# Patient Record
Sex: Female | Born: 1965
Health system: Southern US, Community
[De-identification: ages and names within clinical notes are randomized; demographics above are authoritative.]

## PROBLEM LIST (undated history)

## (undated) DIAGNOSIS — D649 Anemia, unspecified: Secondary | ICD-10-CM

## (undated) DIAGNOSIS — A630 Anogenital (venereal) warts: Secondary | ICD-10-CM

## (undated) DIAGNOSIS — G4730 Sleep apnea, unspecified: Secondary | ICD-10-CM

## (undated) DIAGNOSIS — N179 Acute kidney failure, unspecified: Secondary | ICD-10-CM

## (undated) DIAGNOSIS — T7840XA Allergy, unspecified, initial encounter: Secondary | ICD-10-CM

## (undated) DIAGNOSIS — Z9289 Personal history of other medical treatment: Secondary | ICD-10-CM

## (undated) DIAGNOSIS — I10 Essential (primary) hypertension: Secondary | ICD-10-CM

## (undated) DIAGNOSIS — G709 Myoneural disorder, unspecified: Secondary | ICD-10-CM

## (undated) DIAGNOSIS — Z8619 Personal history of other infectious and parasitic diseases: Secondary | ICD-10-CM

## (undated) DIAGNOSIS — C50919 Malignant neoplasm of unspecified site of unspecified female breast: Secondary | ICD-10-CM

## (undated) HISTORY — DX: Personal history of other infectious and parasitic diseases: Z86.19

## (undated) HISTORY — DX: Anemia, unspecified: D64.9

## (undated) HISTORY — DX: Sleep apnea, unspecified: G47.30

## (undated) HISTORY — DX: Malignant neoplasm of unspecified site of unspecified female breast: C50.919

## (undated) HISTORY — DX: Myoneural disorder, unspecified: G70.9

## (undated) HISTORY — PX: WISDOM TOOTH EXTRACTION: SHX21

## (undated) HISTORY — DX: Anogenital (venereal) warts: A63.0

## (undated) HISTORY — DX: Allergy, unspecified, initial encounter: T78.40XA

## (undated) HISTORY — DX: Essential (primary) hypertension: I10

## (undated) HISTORY — PX: DENTAL SURGERY: SHX609

## (undated) HISTORY — DX: Acute kidney failure, unspecified: N17.9

## (undated) HISTORY — DX: Personal history of other medical treatment: Z92.89

## (undated) HISTORY — PX: MYOMECTOMY: SHX85

## (undated) HISTORY — PX: CERVICAL CONE BIOPSY: SUR198

---

## 2002-02-06 ENCOUNTER — Encounter: Payer: Self-pay | Admitting: Obstetrics and Gynecology

## 2002-02-06 ENCOUNTER — Encounter: Admission: RE | Admit: 2002-02-06 | Discharge: 2002-02-06 | Payer: Self-pay | Admitting: Obstetrics and Gynecology

## 2003-04-24 ENCOUNTER — Inpatient Hospital Stay (HOSPITAL_COMMUNITY): Admission: AD | Admit: 2003-04-24 | Discharge: 2003-04-26 | Payer: Self-pay | Admitting: Family Medicine

## 2003-05-09 ENCOUNTER — Emergency Department (HOSPITAL_COMMUNITY): Admission: EM | Admit: 2003-05-09 | Discharge: 2003-05-09 | Payer: Self-pay | Admitting: *Deleted

## 2003-05-15 ENCOUNTER — Encounter: Payer: Self-pay | Admitting: *Deleted

## 2003-05-15 ENCOUNTER — Ambulatory Visit (HOSPITAL_COMMUNITY): Admission: RE | Admit: 2003-05-15 | Discharge: 2003-05-15 | Payer: Self-pay | Admitting: *Deleted

## 2003-06-06 ENCOUNTER — Encounter (INDEPENDENT_AMBULATORY_CARE_PROVIDER_SITE_OTHER): Payer: Self-pay | Admitting: Specialist

## 2003-06-06 ENCOUNTER — Inpatient Hospital Stay (HOSPITAL_COMMUNITY): Admission: EM | Admit: 2003-06-06 | Discharge: 2003-06-09 | Payer: Self-pay | Admitting: Emergency Medicine

## 2003-06-08 ENCOUNTER — Encounter (INDEPENDENT_AMBULATORY_CARE_PROVIDER_SITE_OTHER): Payer: Self-pay | Admitting: Cardiology

## 2003-06-17 ENCOUNTER — Encounter: Admission: RE | Admit: 2003-06-17 | Discharge: 2003-06-17 | Payer: Self-pay | Admitting: Internal Medicine

## 2003-06-17 ENCOUNTER — Ambulatory Visit (HOSPITAL_COMMUNITY): Admission: RE | Admit: 2003-06-17 | Discharge: 2003-06-17 | Payer: Self-pay | Admitting: Internal Medicine

## 2003-06-23 ENCOUNTER — Encounter: Admission: RE | Admit: 2003-06-23 | Discharge: 2003-06-23 | Payer: Self-pay | Admitting: Internal Medicine

## 2003-07-01 ENCOUNTER — Encounter: Admission: RE | Admit: 2003-07-01 | Discharge: 2003-07-01 | Payer: Self-pay | Admitting: Internal Medicine

## 2003-07-01 ENCOUNTER — Ambulatory Visit (HOSPITAL_COMMUNITY): Admission: RE | Admit: 2003-07-01 | Discharge: 2003-07-01 | Payer: Self-pay | Admitting: Internal Medicine

## 2003-07-06 ENCOUNTER — Encounter: Admission: RE | Admit: 2003-07-06 | Discharge: 2003-07-06 | Payer: Self-pay | Admitting: Internal Medicine

## 2003-07-07 ENCOUNTER — Encounter: Admission: RE | Admit: 2003-07-07 | Discharge: 2003-07-07 | Payer: Self-pay | Admitting: Internal Medicine

## 2004-04-18 ENCOUNTER — Encounter: Admission: RE | Admit: 2004-04-18 | Discharge: 2004-04-18 | Payer: Self-pay | Admitting: Nephrology

## 2008-03-16 ENCOUNTER — Encounter: Admission: RE | Admit: 2008-03-16 | Discharge: 2008-03-16 | Payer: Self-pay | Admitting: Obstetrics and Gynecology

## 2009-04-08 ENCOUNTER — Encounter: Admission: RE | Admit: 2009-04-08 | Discharge: 2009-04-08 | Payer: Self-pay | Admitting: Obstetrics and Gynecology

## 2010-05-02 ENCOUNTER — Encounter: Admission: RE | Admit: 2010-05-02 | Discharge: 2010-05-02 | Payer: Self-pay | Admitting: Obstetrics and Gynecology

## 2010-05-21 ENCOUNTER — Ambulatory Visit: Payer: Self-pay | Admitting: Family Medicine

## 2010-05-21 DIAGNOSIS — I10 Essential (primary) hypertension: Secondary | ICD-10-CM | POA: Insufficient documentation

## 2010-05-21 DIAGNOSIS — M549 Dorsalgia, unspecified: Secondary | ICD-10-CM | POA: Insufficient documentation

## 2010-06-29 ENCOUNTER — Ambulatory Visit: Payer: Self-pay | Admitting: Internal Medicine

## 2010-06-29 DIAGNOSIS — M546 Pain in thoracic spine: Secondary | ICD-10-CM | POA: Insufficient documentation

## 2010-08-22 ENCOUNTER — Encounter: Payer: Self-pay | Admitting: Obstetrics and Gynecology

## 2010-08-30 NOTE — Assessment & Plan Note (Signed)
Summary: back pain, last ov 2005/bcbs/#/ok dr plot/cd   Vital Signs:  Patient profile:   45 year old female Height:      64 inches Weight:      271 pounds BMI:     46.69 Temp:     99.0 degrees F oral Pulse rate:   66 / minute Pulse rhythm:   regular Resp:     16 per minute BP sitting:   150 / 90  (left arm) Cuff size:   large  Vitals Entered By: Lanier Prude, CMA(AAMA) (June 29, 2010 4:09 PM) CC: dull ache/burning back pain   CC:  dull ache/burning back pain.  History of Present Illness: C/o pain in thoracic back on L to the medial from L shoulder blade, dull x 1+ month  Current Medications (verified): 1)  Labetalol Hcl 300 Mg Tabs (Labetalol Hcl) .... Take One Tablet Two Times A Day 2)  Hydrochlorothiazide 25 Mg Tabs (Hydrochlorothiazide) .... Take One Tablet Daily 3)  Hydralazine Hcl 100 Mg Tabs (Hydralazine Hcl) .... Take One Tablet Daily 4)  Prenatal Vitamins 0.8 Mg Tabs (Prenatal Multivit-Min-Fe-Fa) .... Take One Tablet Daily 5)  Iron 325 (65 Fe) Mg Tabs (Ferrous Sulfate) .... Take One Tablet Daily 6)  Naprosyn 500 Mg Tabs (Naproxen) .Marland Kitchen.. 1 Two Times A Day X5 Days and Then As Needed.  Take W/ Food.  Allergies (verified): 1)  ! Penicillin 2)  ! Avelox 3)  ! Erythromycin 4)  ! 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    Physical Exam  General:  Well-developed,well-nourished,in no acute distress; alert,appropriate and cooperative throughout examination Nose:  External nasal examination shows no deformity or inflammation. Nasal mucosa are pink and moist without lesions or exudates. Mouth:  Oral mucosa and oropharynx without lesions or exudates.  Teeth in good repair. Lungs:  Normal respiratory effort, chest expands symmetrically. Lungs are clear to auscultation, no crackles or wheezes. Heart:  Normal rate and regular rhythm. S1 and S2 normal without gallop, murmur, click, rub or other extra sounds. Abdomen:  Bowel sounds positive,abdomen soft and non-tender without masses, organomegaly or hernias noted. Msk:  L paraspinal musscles between the spine and L shoulder blade are tender   Impression & Recommendations:  Problem # 1:  BACK PAIN, THORACIC REGION (ICD-724.1) MSK Assessment Unchanged See "Patient Instructions". Do not carry heavy books, bags The following medications were removed from the medication list:    Naprosyn 500 Mg Tabs (Naproxen) .Marland Kitchen... 1 two times a day x5 days and then as needed.  take w/ food. Her updated medication list for this problem includes:    Ibuprofen 600 Mg Tabs (Ibuprofen) .Marland Kitchen... 1 by mouth bid  pc x 1 wk then as needed for  pain  Orders: T-Thoracic Spine 2 Views [phone])  Complete Medication List: 1)  Labetalol Hcl 300 Mg Tabs (Labetalol hcl) .... Take one tablet two times a day 2)  Hydrochlorothiazide 25 Mg Tabs (Hydrochlorothiazide) .... Take one tablet  daily 3)  Hydralazine Hcl 100 Mg Tabs (Hydralazine hcl) .... Take one tablet daily 4)  Prenatal Vitamins 0.8 Mg Tabs (Prenatal multivit-min-fe-fa) .... Take one tablet daily 5)  Iron 325 (65 Fe) Mg Tabs (Ferrous sulfate) .... Take one tablet daily 6)  Ibuprofen 600 Mg Tabs (Ibuprofen) .Marland Kitchen.. 1 by mouth bid  pc x 1 wk then as needed for  pain  Patient Instructions: 1)  Use stretching and balance exercises that I  have provided (15 min. or longer every day) 2)  Move monitor to center 3)  Use phone head set 4)  Do not carry heavy loads 5)  Call if you are not better in a reasonable amount of time or if worse.  Prescriptions: IBUPROFEN 600 MG TABS (IBUPROFEN) 1 by mouth bid  pc x 1 wk then as needed for  pain  #60 x 3   Entered and Authorized by:   Tresa Garter MD   Signed by:   Tresa Garter MD on 06/29/2010   Method used:   Print then Give to Patient   RxID:   [id number]    Orders Added: 1)  T-Thoracic Spine 2 Views [72070TC] 2)  Est. Patient Level III [14782]

## 2010-08-30 NOTE — Assessment & Plan Note (Signed)
Summary: BACK ISSUES/PAIN/RCD   Vital Signs:  Patient profile:   45 year old female Weight:      272 pounds Pulse rate:   66 / minute BP sitting:   144 / 100  (left arm)  Vitals Entered By: Doristine Devoid CMA (May 21, 2010 12:07 PM) CC: back pain    History of Present Illness: 45 yo woman here today as a new pt.  last saw Dr Posey Rea 'yrs ago'.    1) L shoulder pain- full ROM, pain w/ turning.  sxs first started 1 week ago.  'hasn't been hurting me enough to take anything for the pain.  it feels like a pulled muscle'.  first noticed while watching TV.  L arm is dominant.  no radiation of pain into arm, no pain in neck.    Current Medications (verified): 1)  Labetalol Hcl 300 Mg Tabs (Labetalol Hcl) .... Take One Tablet Two Times A Day 2)  Hydrochlorothiazide 25 Mg Tabs (Hydrochlorothiazide) .... Take One Tablet Daily 3)  Hydralazine Hcl 100 Mg Tabs (Hydralazine Hcl) .... Take One Tablet Daily 4)  Prenatal Vitamins 0.8 Mg Tabs (Prenatal Multivit-Min-Fe-Fa) .... Take One Tablet Daily 5)  Iron 325 (65 Fe) Mg Tabs (Ferrous Sulfate) .... Take One Tablet Daily 6)  Naprosyn 500 Mg Tabs (Naproxen) .Marland Kitchen.. 1 Two Times A Day X5 Days and Then As Needed.  Take W/ Food.  Allergies (verified): 1)  ! Penicillin 2)  ! Avelox 3)  ! Erythromycin 4)  ! 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  discomfort located near L scapula but 'it moves'. Pulses:  +2 carotid, radial Extremities:  no C/C/E Neurologic:  alert & oriented X3, cranial nerves II-XII intact, strength normal in all extremities, and sensation intact to light touch.     Impression & Recommendations:  Problem # 1:  BACK PAIN (ICD-724.5) Assessment New pt's pain is vague and not reproducible on exam.  no red flags on hx or PE.  start NSAIDs.  reviewed supportive care and red flags that should prompt return.  Pt expresses understanding and is in agreement w/ this plan. Her updated medication list for this problem includes:    Naprosyn 500 Mg Tabs (Naproxen) .Marland Kitchen... 1 two times a day x5 days and then as needed.  take w/ food.  Complete Medication List: 1)  Labetalol Hcl 300 Mg Tabs (Labetalol hcl) .... Take one tablet two times a day 2)  Hydrochlorothiazide 25 Mg Tabs (Hydrochlorothiazide) .... Take one tablet daily 3)  Hydralazine Hcl 100 Mg Tabs (Hydralazine hcl) .... Take one tablet daily 4)  Prenatal Vitamins 0.8 Mg Tabs (Prenatal multivit-min-fe-fa) .... Take one tablet daily 5)  Iron 325 (65 Fe) Mg Tabs (Ferrous sulfate) .... Take one tablet daily 6)  Naprosyn 500 Mg Tabs (Naproxen) .Marland Kitchen.. 1 two times a day x5 days and then as needed.  take w/ food.  Patient Instructions:  1)  Please schedule a follow up appt w/ Dr Posey Rea- he will want to get reaquainted 2)  Take the Naprosyn regularly for the next 5 days and then as needed for pain or discomfort.  take w/ food.  avoid any additional ibuprofen or motrin type products 3)  You can add tylenol if needed 4)  Alternate heating pad w/ ice- if one feels better than the other, use that! 5)  I don't feel any cyst or obvious lump- this is good news! 6)  Call with any questions or concerns 7)  Welcome Back! Prescriptions: NAPROSYN 500 MG TABS (NAPROXEN) 1 two times a day x5 days and then  as needed.  take w/ food.  #60 x 0   Entered and Authorized by:   Neena Rhymes MD   Signed by:   Neena Rhymes MD on 05/21/2010   Method used:   [facility]ically to        [facility] [phone]* (retail)       [address]       [place], [place]  [id number]       Ph: [phone] or [phone]       Fax: [phone]   RxID:   [id number]    Orders Added: 1)  New Patient Level II [40347]

## 2010-10-17 ENCOUNTER — Telehealth: Payer: Self-pay | Admitting: Internal Medicine

## 2010-10-21 ENCOUNTER — Ambulatory Visit (INDEPENDENT_AMBULATORY_CARE_PROVIDER_SITE_OTHER): Payer: BC Managed Care – PPO | Admitting: Internal Medicine

## 2010-10-21 ENCOUNTER — Encounter: Payer: Self-pay | Admitting: Internal Medicine

## 2010-10-21 DIAGNOSIS — J069 Acute upper respiratory infection, unspecified: Secondary | ICD-10-CM

## 2010-10-21 DIAGNOSIS — H6590 Unspecified nonsuppurative otitis media, unspecified ear: Secondary | ICD-10-CM

## 2010-10-21 DIAGNOSIS — J351 Hypertrophy of tonsils: Secondary | ICD-10-CM | POA: Insufficient documentation

## 2010-10-21 MED ORDER — PSEUDOEPHEDRINE HCL ER 120 MG PO TB12: 120.0000 mg | ORAL_TABLET | Freq: Two times a day (BID) | ORAL | Status: AC | PRN

## 2010-10-21 MED ORDER — SULFAMETHOXAZOLE-TRIMETHOPRIM 400-80 MG PO TABS: 1.0000 | ORAL_TABLET | Freq: Two times a day (BID) | ORAL | Status: AC

## 2010-10-21 NOTE — Patient Instructions (Addendum)
Call if not better [facility] in the fall with labs

## 2010-10-21 NOTE — Progress Notes (Signed)
  Subjective:    Patient ID: Jasmine Stafford, female    DOB: 1965-08-11, 45 y.o.   MRN: [id number]  HPI C/o ST and voice loss x 1 wk. She wet to UC on Monday. She used herbal meds. She is wondering if her tonsils need to be taken out.   Review of Systems  Constitutional: Positive for fatigue.  HENT: Positive for congestion, sore throat, rhinorrhea, sneezing, trouble swallowing, voice change and postnasal drip. Negative for hearing loss and ear pain.   Eyes: Negative for pain.  Respiratory: Positive for cough and shortness of breath.        Objective:   Physical Exam  Constitutional: She appears well-developed. No distress.  HENT:  Head: Normocephalic.  Right Ear: Tympanic membrane is not erythematous. A middle ear effusion is present.  Left Ear: Tympanic membrane is not erythematous. A middle ear effusion is present.  Nose: Mucosal edema and rhinorrhea present.  Mouth/Throat: Mucous membranes are not pale and not cyanotic. No dental caries. Posterior oropharyngeal edema and posterior oropharyngeal erythema present. No oropharyngeal exudate or tonsillar abscesses.  Eyes: Right eye exhibits no discharge. Left eye exhibits no discharge.  Cardiovascular: Normal rate.   No murmur heard. Pulmonary/Chest: No respiratory distress. She has no wheezes.  Abdominal: Soft.  Skin: She is not diaphoretic.          Assessment & Plan:  Upper respiratory infection Use OTC meds  Seromucinous otitis media She will start antibiotic if not better  Hypertrophy of tonsil Will obtain ENT consult

## 2010-10-21 NOTE — Assessment & Plan Note (Signed)
Will obtain ENT consult

## 2010-10-21 NOTE — Assessment & Plan Note (Signed)
She will start antibiotic if not better

## 2010-10-21 NOTE — Assessment & Plan Note (Signed)
Use OTC meds

## 2010-10-27 NOTE — Progress Notes (Signed)
Summary: med inquiry  Phone Note Call from Patient Call back at Home Phone [phone]   Reason for Call: Acute Illness Summary of Call: Pt c/o sore & inflammed throat and tonsils w/extreme hoarseness. Pt would like to know if she could get OV and/or an ABX Rx.? Initial call taken by: Burnard Leigh [facility]),  October 17, 2010 12:01 PM  Follow-up for Phone Call        Use over-the-counter medicines for "cold": Tylenol  650mg  or Advil 400mg  every 6 hours  for fever; Delsym or Robutussin for cough. Mucinex or Mucinex D for congestion. Ricola or Halls for sore throat. Office visit if not better or if worse. ? UC office appt Follow-up by: Tresa Garter MD,  October 17, 2010 6:32 PM  Additional Follow-up for Phone Call Additional follow up Details #1::        informed pt she will call in a couple of day to set up appt if no relief. Additional Follow-up by: Ami Bullins CMA,  October 17, 2010 6:47 PM

## 2010-12-16 NOTE — Discharge Summary (Signed)
NAME:  Jasmine Stafford, Jasmine Stafford                        ACCOUNT NO.:  [id number]   MEDICAL RECORD NO.:  [id number]                   PATIENT TYPE:  INP   LOCATION:  9308                                 FACILITY:  [facility]   PHYSICIAN:  Tanya S. Shawnie Pons, M.D.                DATE OF BIRTH:  Mar 24, 1966   DATE OF ADMISSION:  04/24/2003  DATE OF DISCHARGE:  04/26/2003                                 DISCHARGE SUMMARY   FINAL DIAGNOSES:  1. Fibroid uterus.  2. Menorrhagia.  3. Anemia secondary to #2.  4. Elevated blood pressures.   PROCEDURES:  IV Premarin, 4 units of packed red blood cells transfused and  blood pressure maintenance.   REASON FOR ADMISSION:  Briefly, the patient is a 45 year old gravida 1, para  0 who has known to have a fibroid uterus and has been admitted previously.  Her vaginal bleeding required 4 units of pack cells and began bleeding  approximately several days ago prior to admission here.  Approximately three  days prior to admission, she developed heavy vaginal bleeding and was  actually going to see her gynecologist who is currently in [place] that  day but was so dizzy and could not stand up that EMS brought her directly  here.  Once she was evaluated here, she was found to have a hemoglobin of  5.3 and was admitted for transfusions and control of hemorrhage.  On  admission her blood pressure was 118/62 and her pulse was 87.   HOSPITAL COURSE:  The patient was admitted to the [facility] where she  received two units of packed cells and IV Premarin.  Her bleeding continued  and a posttransfusion hemoglobin was 6.5, so she was typed and crossed for  two more units of packed cells and given those over hospital day #2.  Her IV  Premarin was continued until her bleeding became scant by hospital day #3.  She did develop on hospital day #2 elevated blood pressures some even as  high as 200/110 and she also developed nausea and vomiting on hospital day  #2.  After holding  the IV Premarin, these seemed to resolve and she was  tolerating a regular diet by hospital day #3.  She was given Provera 10 mg  p.o. to stabilize her uterine lining after the IV Premarin was discontinued.  Her blood pressure remained elevated throughout hospital day #3 and she  required labetalol for her 217/118 blood pressure through the evening of  hospital day #2 and then was placed on HCTZ 25 mg on hospital day #3.  Her  pressures were approximately 170s over 100s at the time of discharge.  A  posttransfusion hemoglobin was 8.6 after the second two units of blood, and  on her final hospital day her hemoglobin had increased to 8.9.  She was  placed on iron p.o. and was doing exceptionally well on hospital day #3 and  was ready for discharge.   DISCHARGE DISPOSITION:  The patient is discharged to home in good condition.   DISCHARGE MEDICATIONS:  1. Provera 10 mg one p.o. daily for the next 7 days.  2. HCTZ 25 mg one p.o. daily with a blood pressure check in approximately 2     days.   She is instructed to return if any neurologic symptoms or persistent  headache.  Additionally, she should have her fibroids evaluated and further  control of this problem will be deferred to her gynecologist who is W.  Arma Heading M.D. in [place].   DISCHARGE ACTIVITY:  Will be ad lib.   DISCHARGE DIET:  Low salt.   Should her bleeding return, she is also instructed to return.                                               Shelbie Proctor. Shawnie Pons, M.D.    TSP/MEDQ  D:  04/26/2003  T:  04/26/2003  Job:  [id number]   cc:   Jeneen Montgomery, M.D.  [address]  [place], [place]

## 2010-12-16 NOTE — Discharge Summary (Signed)
NAME:  Jasmine Stafford, Jasmine Stafford                        ACCOUNT NO.:  [id number]   MEDICAL RECORD NO.:  [id number]                   PATIENT TYPE:  INP   LOCATION:  2928                                 FACILITY:  [facility]   PHYSICIAN:  Alvester Morin, M.D.               DATE OF BIRTH:  [birth year]   DATE OF ADMISSION:  06/06/2003  DATE OF DISCHARGE:  06/09/2003                                 DISCHARGE SUMMARY   DISCHARGE DIAGNOSES:  1. Pleural effusion.  2. Hypertension.  3. Anemia.  4. Pneumonia.   REASON FOR HOSPITALIZATION:  This patient presented to the emergency room on  June 06, 2003, with complaint of shortness of breath that started about  10 p.m. prior to the day of admission.  She was having a nonproductive cough  and chest pain that was worse with inspiration.  Chest pain did radiate to  her back with exertion and was worse when reclining and improved when she  sat up.   PAST MEDICAL HISTORY:  1. The patient was status post a myomectomy on May 21, 2003, secondary     to a history of a fibroid uterus with menorrhagia.  The patient required     four units of packed red blood cells and had a hemoglobin of 5.3 on her     admission to [facility] for the menorrhagia.  This date of admission     was April 24, 2003, through April 26, 2003.  2. Hypertension.  3. Status post D&C in June of 2002.  4. Status post a cervical cone biopsy in 1990.   DISCHARGE MEDICATIONS:  1. Hydrochlorothiazide 25 mg one p.o. daily.  2. Avelox 400 mg one tablet p.o. daily until gone for pneumonia.   PRIOR RADIOGRAPHIC STUDIES:  1. Chest PA and lateral on June 06, 2003, showed cardiomegaly with     vascular congestion and interstitial edema compatible with CHF.  2. CT of the chest on June 06, 2003, showed no evidence of pulmonary     thromboembolism, bilateral atelectatic change most severe in right lower     lobe, large right pleural effusion, cardiomegaly, and mild vascular   congestion, hiatal hernia.  3. CT of lower extremity showed no filling defects in the venous system of     lower extremities to suggest DVT.  Stranding seen in the subcutaneous fat     of the anterior lower abdomen most likely due to post-surgical changes.  4. On June 08, 2003, CT chest right decubitus view, large free-flowing     right effusion.  5. June 08, 2003, [place] and lateral chest showed no significant change from     prior study on June 06, 2003.  6. June 06, 2003, ultrasound thoracentesis was ultrasound-guided right     diagnostic and therapeutic thoracentesis that yielded 500 mL of pleural     fluid.  7. Ultrasound of the chest on June 08, 2003, showed a relatively small     amount of pleural fluid evident by ultrasound in the right pleural space,     no thoracentesis was performed.  8. 2-D echo on June 08, 2003, showed overall left ventricular systolic     function that was at the lower limits of normal, left ventricular     ejection fraction was estimated in a range of 50-55%, study was     inadequate for evaluation of the left ventricular regional wall motion     and left ventricular wall thickness is moderately increased.   PROCEDURES PERFORMED:  Therapeutic and diagnostic thoracentesis was  performed on June 06, 2003.  The patient was re-evaluated for  thoracentesis on June 08, 2003, but was found to have a relatively small  amount of pleural fluid by ultrasound and so the procedure was not  performed.   LABS:  CBC on admission; white blood cell count 10.5, hemoglobin 11.1,  hematocrit 34.1, platelet count 713, MCV 80.2, absolute neutrophil count  7.7.  CMP all values within normal limits with the exception of a serum  glucose of 106 and total protein of 8.4.  BNP was less than 32.  TSH was  within normal limits as were PT, PTT and INR.  Cardiac enzymes were  negative.  On thoracentesis fluid color was yellow appearance, cloudy, and  had a white  blood cell count of 7,667, segmented neutrophils 75, lymphocytes  16, monocytes macrophages 9.  LDH of the fluid was 475, fluid amylase 64,  fluid glucose 110, fluid pH 7.59, albumin fluid 2.6, this correlated with  patient LDH of 219 with LDH1 of 16, LDH2 of 37, LDH3 of 27, LDH4 of 11, LDH5  of 9 respectively.  Pathology smear of the fluid interpretation of June 06, 2003, showed interpretation right pleural fluid, thin prep in cell block  with reactive mesothelial cells present.  Fluid triglycerides was 440.  Acid-  fast culture was negative for acid-fast bacilli.  Report status is still  pending.  Patient was evaluated for rheumatoid factor with the result being  less than 20.  Antinuclear antibodies IGG none detected.  Culture of the  body fluid on gram smear showed white blood cells present, both polys and  mononuclear, no organisms seen, no growth x3 days, and final on that  culture.  Admission blood gas showed pH of 7.407, pCO2 of 47, bicarb 30,  pCO2 of 31, base excess 4.0.  HIV nonreactive.  Hepatitis C antibody screen  negative.  Hepatitis B surface antigen negative.  Urine pregnancy negative.   HOSPITAL COURSE:  Problem 1:  Pleural effusion/pneumonia.  Patient was  admitted to a stepdown unit.  She was placed on oxygen to keep her O2 sats  greater than 92%.  On June 06, 2003, an ultrasound-guided thoracentesis  was performed and samples were sent to the lab to be evaluated as listed  above in the laboratory section.  Subsequent to the thoracentesis, the  patient experienced some relief of her symptoms.  On June 08, 2003, a  decision was made to start her empirically on Avelox as it was thought that  her effusion may be parapneumonic in nature.  Subsequent to the start of the  medication, the patient's symptoms continued to improve.  On June 08, 2003, on her chest x-ray the patient was found again to have a large free flowing right pleural effusion.  Again, she was  sent to radiology for an  ultrasound-guided thoracentesis.  However, on ultrasound only a small amount  of pleural fluid was evident in the right pleural space and so thoracentesis  was not attempted.  Clinically the patient's symptoms improved and appeared  to be resolving, and so she was discharged on June 09, 2003, with  instructions for close follow up.  Patient was instructed to return to the  hospital if any of her shortness of breath returned or her condition  worsened.  She was scheduled for a follow up appointment in the [facility] with Dr. Donald Pore on June 16, 2003.   Problem 2:  Hypertension.  The patient was continued on hydrochlorothiazide  25 mg p.o. daily during her stay.   Problem 3:  Anemia.  Patient had a prior history of anemia as aforementioned  in her past medical history.  However, hemoglobin and hematocrit, though  mildly anemic, her hemoglobin remained stable during this admission with  values around 10.8, 10.5.     Donald Pore, MD                          Alvester Morin, M.D.   HP/MEDQ  D:  07/10/2003  T:  07/11/2003  Job:  [id number]   cc:   Danice Goltz, M.D. [facility]

## 2011-05-19 ENCOUNTER — Other Ambulatory Visit: Payer: Self-pay | Admitting: Obstetrics and Gynecology

## 2011-05-19 DIAGNOSIS — Z1231 Encounter for screening mammogram for malignant neoplasm of breast: Secondary | ICD-10-CM

## 2011-06-05 ENCOUNTER — Ambulatory Visit
Admission: RE | Admit: 2011-06-05 | Discharge: 2011-06-05 | Disposition: A | Discharge status: Home / Self Care | Payer: BC Managed Care – PPO | Source: Ambulatory Visit | Attending: Obstetrics and Gynecology | Admitting: Obstetrics and Gynecology

## 2011-06-05 DIAGNOSIS — Z1231 Encounter for screening mammogram for malignant neoplasm of breast: Secondary | ICD-10-CM

## 2012-03-28 ENCOUNTER — Telehealth: Payer: Self-pay | Admitting: Internal Medicine

## 2012-03-28 NOTE — Telephone Encounter (Signed)
Caller: [name]/Patient; Patient Name: Jasmine Stafford; PCP: Sonda Primes (Adults only); Best Callback Phone Number: [phone]; Reason for call: Became sick with nausea/vomiting and Diarrhea. Onset yesterday 03/28/12.  She is unsure if it food poisoning or virus. Afebrile. Vomited x3 today. Last emesis at 10;00 a.m.  Unsure how many times emesis occurred yesterday.  Diarrhea x 6-7 . Currently on Menstrual cycle. Mild cramping.  She states she feels like she maybe getting dehydrated.  Ginger ale and Gingerale mixed with grape juice, dry toast. She states she is unable to keep down her blood pressure medication today or yesterday.  She is now missed x3 doses of medication. She states her Blood pressure is going up and down.  Emergent s/sx ruled per Nausea or vomiting Protocol with exception to "Unable to take or keep down prescription medication ".  Call Provider Immediately.  Reviewed appointment schedule.  No available appointments for patient. Message sent to office per office protocol .   PLEASE CONTACT FOR MEDICATION FOR VOMITING OR APPOINTMENT BY PHYSICIAN.   [facility] uses [facility].

## 2012-03-29 ENCOUNTER — Emergency Department (HOSPITAL_COMMUNITY)
Admission: EM | Admit: 2012-03-29 | Discharge: 2012-03-29 | Disposition: A | Discharge status: Home / Self Care | Payer: BC Managed Care – PPO | Attending: Emergency Medicine | Admitting: Emergency Medicine

## 2012-03-29 ENCOUNTER — Encounter (HOSPITAL_COMMUNITY): Payer: Self-pay | Admitting: *Deleted

## 2012-03-29 DIAGNOSIS — E669 Obesity, unspecified: Secondary | ICD-10-CM | POA: Insufficient documentation

## 2012-03-29 DIAGNOSIS — R5383 Other fatigue: Secondary | ICD-10-CM | POA: Insufficient documentation

## 2012-03-29 DIAGNOSIS — R109 Unspecified abdominal pain: Secondary | ICD-10-CM | POA: Insufficient documentation

## 2012-03-29 DIAGNOSIS — R509 Fever, unspecified: Secondary | ICD-10-CM | POA: Insufficient documentation

## 2012-03-29 DIAGNOSIS — R197 Diarrhea, unspecified: Secondary | ICD-10-CM | POA: Insufficient documentation

## 2012-03-29 DIAGNOSIS — R112 Nausea with vomiting, unspecified: Secondary | ICD-10-CM | POA: Insufficient documentation

## 2012-03-29 DIAGNOSIS — R5381 Other malaise: Secondary | ICD-10-CM | POA: Insufficient documentation

## 2012-03-29 DIAGNOSIS — I10 Essential (primary) hypertension: Secondary | ICD-10-CM | POA: Insufficient documentation

## 2012-03-29 DIAGNOSIS — R61 Generalized hyperhidrosis: Secondary | ICD-10-CM | POA: Insufficient documentation

## 2012-03-29 DIAGNOSIS — E876 Hypokalemia: Secondary | ICD-10-CM | POA: Insufficient documentation

## 2012-03-29 LAB — CBC WITH DIFFERENTIAL/PLATELET
Basophils Absolute: 0 10*3/uL (ref 0.0–0.1)
Basophils Relative: 0 % (ref 0–1)
Eosinophils Absolute: 0 10*3/uL (ref 0.0–0.7)
Eosinophils Relative: 0 % (ref 0–5)
HCT: 34.2 % — ABNORMAL LOW (ref 36.0–46.0)
Hemoglobin: 11.3 g/dL — ABNORMAL LOW (ref 12.0–15.0)
Lymphocytes Relative: 11 % — ABNORMAL LOW (ref 12–46)
Lymphs Abs: 1.2 10*3/uL (ref 0.7–4.0)
MCH: 28.4 pg (ref 26.0–34.0)
MCHC: 33 g/dL (ref 30.0–36.0)
MCV: 85.9 fL (ref 78.0–100.0)
Monocytes Absolute: 0.4 10*3/uL (ref 0.1–1.0)
Monocytes Relative: 4 % (ref 3–12)
Neutro Abs: 9.4 10*3/uL — ABNORMAL HIGH (ref 1.7–7.7)
Neutrophils Relative %: 85 % — ABNORMAL HIGH (ref 43–77)
Platelets: 337 10*3/uL (ref 150–400)
RBC: 3.98 MIL/uL (ref 3.87–5.11)
RDW: 15.1 % (ref 11.5–15.5)
WBC: 11 10*3/uL — ABNORMAL HIGH (ref 4.0–10.5)

## 2012-03-29 LAB — URINALYSIS, ROUTINE W REFLEX MICROSCOPIC
Bilirubin Urine: NEGATIVE
Glucose, UA: NEGATIVE mg/dL
Hgb urine dipstick: NEGATIVE
Ketones, ur: NEGATIVE mg/dL
Leukocytes, UA: NEGATIVE
Nitrite: NEGATIVE
Protein, ur: NEGATIVE mg/dL
Specific Gravity, Urine: 1.018 (ref 1.005–1.030)
Urobilinogen, UA: 0.2 mg/dL (ref 0.0–1.0)
pH: 7 (ref 5.0–8.0)

## 2012-03-29 LAB — COMPREHENSIVE METABOLIC PANEL
ALT: 18 U/L (ref 0–35)
AST: 21 U/L (ref 0–37)
Albumin: 4.2 g/dL (ref 3.5–5.2)
Alkaline Phosphatase: 78 U/L (ref 39–117)
BUN: 11 mg/dL (ref 6–23)
CO2: 30 mEq/L (ref 19–32)
Calcium: 9.9 mg/dL (ref 8.4–10.5)
Chloride: 94 mEq/L — ABNORMAL LOW (ref 96–112)
Creatinine, Ser: 0.97 mg/dL (ref 0.50–1.10)
GFR calc Af Amer: 81 mL/min — ABNORMAL LOW (ref >90)
GFR calc non Af Amer: 69 mL/min — ABNORMAL LOW (ref >90)
Glucose, Bld: 159 mg/dL — ABNORMAL HIGH (ref 70–99)
Potassium: 3.1 mEq/L — ABNORMAL LOW (ref 3.5–5.1)
Sodium: 134 mEq/L — ABNORMAL LOW (ref 135–145)
Total Bilirubin: 0.5 mg/dL (ref 0.3–1.2)
Total Protein: 8.7 g/dL — ABNORMAL HIGH (ref 6.0–8.3)

## 2012-03-29 LAB — POCT PREGNANCY, URINE: Preg Test, Ur: NEGATIVE

## 2012-03-29 MED ORDER — ONDANSETRON 4 MG PO TBDP: 4.0000 mg | ORAL_TABLET | Freq: Three times a day (TID) | ORAL | Status: AC | PRN

## 2012-03-29 MED ORDER — ONDANSETRON HCL 4 MG/2ML IJ SOLN
4.0000 mg | Freq: Once | INTRAMUSCULAR | Status: AC
  Administered 2012-03-29: 4 mg via INTRAVENOUS
  Filled 2012-03-29: qty 2

## 2012-03-29 MED ORDER — METOCLOPRAMIDE HCL 5 MG/ML IJ SOLN
10.0000 mg | Freq: Once | INTRAMUSCULAR | Status: AC
  Administered 2012-03-29: 10 mg via INTRAVENOUS
  Filled 2012-03-29: qty 2

## 2012-03-29 MED ORDER — POTASSIUM CHLORIDE 10 MEQ/100ML IV SOLN
10.0000 meq | Freq: Once | INTRAVENOUS | Status: AC
  Administered 2012-03-29: 10 meq via INTRAVENOUS
  Filled 2012-03-29: qty 100

## 2012-03-29 MED ORDER — POTASSIUM CHLORIDE 20 MEQ/15ML (10%) PO LIQD
40.0000 meq | Freq: Once | ORAL | Status: AC
  Administered 2012-03-29: 40 meq via ORAL
  Filled 2012-03-29: qty 30

## 2012-03-29 MED ORDER — SODIUM CHLORIDE 0.9 % IV SOLN
Freq: Once | INTRAVENOUS | Status: AC
  Administered 2012-03-29: 05:00:00 via INTRAVENOUS

## 2012-03-29 MED ORDER — ONDANSETRON HCL 4 MG/2ML IJ SOLN
4.0000 mg | Freq: Once | INTRAMUSCULAR | Status: AC
  Administered 2012-03-29: 4 mg via INTRAVENOUS

## 2012-03-29 MED ORDER — LOPERAMIDE HCL 2 MG PO CAPS: 2.0000 mg | ORAL_CAPSULE | Freq: Four times a day (QID) | ORAL | Status: DC | PRN

## 2012-03-29 MED ORDER — LABETALOL HCL 5 MG/ML IV SOLN
10.0000 mg | Freq: Once | INTRAVENOUS | Status: AC
  Administered 2012-03-29: 10 mg via INTRAVENOUS
  Filled 2012-03-29: qty 4

## 2012-03-29 MED ORDER — LABETALOL HCL 5 MG/ML IV SOLN
10.0000 mg | Freq: Once | INTRAVENOUS | Status: AC
  Administered 2012-03-29: 10 mg via INTRAVENOUS

## 2012-03-29 MED ORDER — ONDANSETRON HCL 4 MG/2ML IJ SOLN
INTRAMUSCULAR | Status: AC
  Administered 2012-03-29: 4 mg via INTRAVENOUS
  Filled 2012-03-29: qty 2

## 2012-03-29 MED ORDER — PROMETHAZINE HCL 25 MG RE SUPP: 25.0000 mg | Freq: Four times a day (QID) | RECTAL | Status: DC | PRN

## 2012-03-29 MED ORDER — LABETALOL HCL 5 MG/ML IV SOLN
5.0000 mg | Freq: Once | INTRAVENOUS | Status: AC
  Administered 2012-03-29: 5 mg via INTRAVENOUS
  Filled 2012-03-29: qty 4

## 2012-03-29 NOTE — ED Provider Notes (Signed)
History     CSN: [id number]  Arrival date & time 03/29/12  0344   First MD Initiated Contact with Patient 03/29/12 [phone]      Chief Complaint  Patient presents with  . Diarrhea  . Emesis   HPI  History provided by the patient. Patient is a 46 year old African American female with history of hypertension, obesity who presents with complaints of nausea vomiting and diarrhea. Patient reports having acute onset of nausea vomiting Wednesday evening after returning home from work. Patient works [employer] representative. Shortly after nausea vomiting symptoms patient developed episodes of diarrhea. She reports having loose watery stools frequently throughout the day. She reports up to 7 yesterday. Denies any blood or mucus. Patient also reports multiple episodes of nausea vomiting. She has been unable to keep down much food or fluids. Patient also states she was unable to take her normal blood pressure medications. Symptoms have also been associated with some fever, chills and sweats. There is also some occasional lower abdominal cramping and pains. She denies any other aggravating or alleviating factors.    Past Medical History  Diagnosis Date  . Anemia   . HTN (hypertension)   . History of chicken pox   . Genital warts   . Status post blood transfusion without reported diagnosis 1998, 2002, 2005 x2    Past Surgical History  Procedure Date  . Myomectomy   . Cervical cone biopsy     cervical tumors    Family History  Problem Relation Age of Onset  . Breast cancer Other   . Uterine cancer Other   . Ovarian cancer Other   . Colon cancer Other   . Arthritis Other     History  Substance Use Topics  . Smoking status: Never Smoker   . Smokeless tobacco: Not on file  . Alcohol Use: No    OB History    Grav Para Term Preterm Abortions TAB SAB Ect Mult Living                  Review of Systems  Constitutional: Positive for fever, chills, diaphoresis, appetite change and fatigue.    Respiratory: Negative for cough and shortness of breath.   Cardiovascular: Negative for chest pain.  Gastrointestinal: Positive for nausea, vomiting, abdominal pain and diarrhea. Negative for constipation and blood in stool.    Allergies  Erythromycin and Moxifloxacin  Home Medications   Current Outpatient Rx  Name Route Sig Dispense Refill  . FERROUS GLUCONATE 325 MG PO TABS Oral Take 325 mg by mouth daily.      Marland Kitchen HYDRALAZINE HCL 100 MG PO TABS Oral Take 100 mg by mouth daily.      Marland Kitchen HYDROCHLOROTHIAZIDE 25 MG PO TABS Oral Take 25 mg by mouth daily.      . IBUPROFEN 600 MG PO TABS Oral Take 600 mg by mouth 2 (two) times daily as needed.      Marland Kitchen LABETALOL HCL 300 MG PO TABS Oral Take 300 mg by mouth 2 (two) times daily.      Marland Kitchen PRENATE ELITE 90-600-400 MG-MCG-MCG PO TABS Oral Take 1 tablet by mouth daily.        BP 207/91  Pulse 64  Temp 97.9 F (36.6 C)  Resp 16  SpO2 100%  Physical Exam  Nursing note and vitals reviewed. Constitutional: She is oriented to person, place, and time. She appears well-developed and well-nourished. No distress.  HENT:  Head: Normocephalic.  Cardiovascular: Normal rate and regular rhythm.  No murmur heard. Pulmonary/Chest: Effort normal and breath sounds normal. No respiratory distress. She has no wheezes. She has no rales.  Abdominal: Soft. She exhibits no distension. There is tenderness. There is no rebound and no guarding.       Mild diffuse tenderness.  Neurological: She is alert and oriented to person, place, and time.  Skin: Skin is warm and dry. No rash noted.  Psychiatric: She has a normal mood and affect. Her behavior is normal.    ED Course  Procedures   Results for orders placed during the hospital encounter of 03/29/12  CBC WITH DIFFERENTIAL      Component Value Range   WBC 11.0 (*) 4.0 - 10.5 K/uL   RBC 3.98  3.87 - 5.11 MIL/uL   Hemoglobin 11.3 (*) 12.0 - 15.0 g/dL   HCT 11.9 (*) 14.7 - 82.9 %   MCV 85.9  78.0 - 100.0 fL    MCH 28.4  26.0 - 34.0 pg   MCHC 33.0  30.0 - 36.0 g/dL   RDW 56.2  13.0 - 86.5 %   Platelets 337  150 - 400 K/uL   Neutrophils Relative 85 (*) 43 - 77 %   Neutro Abs 9.4 (*) 1.7 - 7.7 K/uL   Lymphocytes Relative 11 (*) 12 - 46 %   Lymphs Abs 1.2  0.7 - 4.0 K/uL   Monocytes Relative 4  3 - 12 %   Monocytes Absolute 0.4  0.1 - 1.0 K/uL   Eosinophils Relative 0  0 - 5 %   Eosinophils Absolute 0.0  0.0 - 0.7 K/uL   Basophils Relative 0  0 - 1 %   Basophils Absolute 0.0  0.0 - 0.1 K/uL  COMPREHENSIVE METABOLIC PANEL      Component Value Range   Sodium 134 (*) 135 - 145 mEq/L   Potassium 3.1 (*) 3.5 - 5.1 mEq/L   Chloride 94 (*) 96 - 112 mEq/L   CO2 30  19 - 32 mEq/L   Glucose, Bld 159 (*) 70 - 99 mg/dL   BUN 11  6 - 23 mg/dL   Creatinine, Ser 7.84  0.50 - 1.10 mg/dL   Calcium 9.9  8.4 - 69.6 mg/dL   Total Protein 8.7 (*) 6.0 - 8.3 g/dL   Albumin 4.2  3.5 - 5.2 g/dL   AST 21  0 - 37 U/L   ALT 18  0 - 35 U/L   Alkaline Phosphatase 78  39 - 117 U/L   Total Bilirubin 0.5  0.3 - 1.2 mg/dL   GFR calc non Af Amer 69 (*) >90 mL/min   GFR calc Af Amer 81 (*) >90 mL/min  URINALYSIS, ROUTINE W REFLEX MICROSCOPIC      Component Value Range   Color, Urine YELLOW  YELLOW   APPearance CLEAR  CLEAR   Specific Gravity, Urine 1.018  1.005 - 1.030   pH 7.0  5.0 - 8.0   Glucose, UA NEGATIVE  NEGATIVE mg/dL   Hgb urine dipstick NEGATIVE  NEGATIVE   Bilirubin Urine NEGATIVE  NEGATIVE   Ketones, ur NEGATIVE  NEGATIVE mg/dL   Protein, ur NEGATIVE  NEGATIVE mg/dL   Urobilinogen, UA 0.2  0.0 - 1.0 mg/dL   Nitrite NEGATIVE  NEGATIVE   Leukocytes, UA NEGATIVE  NEGATIVE  POCT PREGNANCY, URINE      Component Value Range   Preg Test, Ur NEGATIVE  NEGATIVE       No diagnosis found.    MDM  4:15AM patient seen and evaluated. Patient resting in bed and appears comfortable in no acute distress. Patient is hypertensive. We'll give small dose of labetalol IV. Patient normally takes 300 mg  labetalol twice a day.  Patient with slight hypokalemia. Potassium ordered. Patient complains of still feeling some nausea despite Zofran. We'll give additional antiemetics.    Pt discussed with Attending Physician.  Pt also discussed in sign out with Sarita Bottom PA-C.  They will continue to monitor pt's BP and symptoms.     Angus Seller, [place] 03/29/12 [phone]

## 2012-03-29 NOTE — Telephone Encounter (Signed)
To ER if very sick OV if ok Thx

## 2012-03-29 NOTE — ED Provider Notes (Signed)
Sign out from Per Dammen PA:  This is a 46 y.o. female with 3 days if N/V/D. found to be hypertensive patient has not been able to take her normal labetalol and hydralazine in the past 3 days to 2 vomiting.  0700 Pt seen and examined INAD, Resting comfortably, She denies vomiting, has been drinking grape juice and states that she is hungry. 185/88. Heart is regular rate and rhythm with no murmurs rubs or gallops. Lung sounds are clear to auscultation bilaterally abdominal exam is benign with no tenderness to palpation and no peritoneal signs. Cranial nerves III through XII are intact with strength 5 out of 5x4 extremities. Finger to nose and heel-to-shin coordinated,  No pronator drift. Patient not ambulated.Potassium is found to be 3.1 I will supplemented orally with 40 mEq  0800 patient is tolerating by mouth and remains pain-free. Serial abdominal exams are benign. She has not vomited and has no nausea at this time.  Patient's blood pressure is elevated however she is asymptomatic at this time. I believe the elevation is secondary to lack of taking her oral medications for the last 3 days to 2 vomiting. I will discharge the patient with a prescription for Zofran ODT Phenergan suppositories and loperamide. She will follow with her primary care doctor in the next 24-48 hours. Pt verbalized understanding and agrees with care plan. Outpatient follow-up and return precautions given.       Wynetta Emery, PA-C 03/29/12 [phone]

## 2012-03-29 NOTE — ED Notes (Signed)
Pt c/o emesis and diarrhea since Wed. Denies fevers.  Pt has been unable to take her bp medications d/t vomiting and is hypertensive.  Originally pt experienced lower abd pain, but presently does not.

## 2012-03-29 NOTE — Telephone Encounter (Signed)
Pt declined both stating she is improving slowly. She will continue to monitor her sxs and call back PRN.

## 2012-03-31 ENCOUNTER — Encounter (HOSPITAL_COMMUNITY): Payer: Self-pay | Admitting: Emergency Medicine

## 2012-03-31 ENCOUNTER — Inpatient Hospital Stay (HOSPITAL_COMMUNITY)
Admission: EM | Admit: 2012-03-31 | Discharge: 2012-04-02 | DRG: 316 | Disposition: A | Discharge status: Home / Self Care | Payer: BC Managed Care – PPO | Attending: Internal Medicine | Admitting: Internal Medicine

## 2012-03-31 ENCOUNTER — Emergency Department (HOSPITAL_COMMUNITY): Payer: BC Managed Care – PPO

## 2012-03-31 DIAGNOSIS — Z880 Allergy status to penicillin: Secondary | ICD-10-CM

## 2012-03-31 DIAGNOSIS — D649 Anemia, unspecified: Secondary | ICD-10-CM

## 2012-03-31 DIAGNOSIS — K529 Noninfective gastroenteritis and colitis, unspecified: Secondary | ICD-10-CM

## 2012-03-31 DIAGNOSIS — M546 Pain in thoracic spine: Secondary | ICD-10-CM

## 2012-03-31 DIAGNOSIS — Z881 Allergy status to other antibiotic agents status: Secondary | ICD-10-CM

## 2012-03-31 DIAGNOSIS — E860 Dehydration: Secondary | ICD-10-CM

## 2012-03-31 DIAGNOSIS — K5900 Constipation, unspecified: Secondary | ICD-10-CM

## 2012-03-31 DIAGNOSIS — R55 Syncope and collapse: Secondary | ICD-10-CM

## 2012-03-31 DIAGNOSIS — K5289 Other specified noninfective gastroenteritis and colitis: Secondary | ICD-10-CM | POA: Diagnosis present

## 2012-03-31 DIAGNOSIS — R42 Dizziness and giddiness: Secondary | ICD-10-CM | POA: Diagnosis present

## 2012-03-31 DIAGNOSIS — M549 Dorsalgia, unspecified: Secondary | ICD-10-CM

## 2012-03-31 DIAGNOSIS — E669 Obesity, unspecified: Secondary | ICD-10-CM

## 2012-03-31 DIAGNOSIS — I10 Essential (primary) hypertension: Secondary | ICD-10-CM

## 2012-03-31 DIAGNOSIS — E876 Hypokalemia: Secondary | ICD-10-CM

## 2012-03-31 DIAGNOSIS — J351 Hypertrophy of tonsils: Secondary | ICD-10-CM

## 2012-03-31 DIAGNOSIS — N179 Acute kidney failure, unspecified: Principal | ICD-10-CM

## 2012-03-31 DIAGNOSIS — H6590 Unspecified nonsuppurative otitis media, unspecified ear: Secondary | ICD-10-CM

## 2012-03-31 DIAGNOSIS — D509 Iron deficiency anemia, unspecified: Secondary | ICD-10-CM

## 2012-03-31 DIAGNOSIS — Z6841 Body Mass Index (BMI) 40.0 and over, adult: Secondary | ICD-10-CM

## 2012-03-31 DIAGNOSIS — J069 Acute upper respiratory infection, unspecified: Secondary | ICD-10-CM

## 2012-03-31 LAB — BASIC METABOLIC PANEL
CO2: 28 mEq/L (ref 19–32)
Chloride: 95 mEq/L — ABNORMAL LOW (ref 96–112)
Creatinine, Ser: 2.01 mg/dL — ABNORMAL HIGH (ref 0.50–1.10)
Glucose, Bld: 90 mg/dL (ref 70–99)

## 2012-03-31 LAB — URINALYSIS, ROUTINE W REFLEX MICROSCOPIC
Glucose, UA: NEGATIVE mg/dL
Leukocytes, UA: NEGATIVE
pH: 6.5 (ref 5.0–8.0)

## 2012-03-31 LAB — URINE MICROSCOPIC-ADD ON

## 2012-03-31 LAB — CBC
HCT: 32.4 % — ABNORMAL LOW (ref 36.0–46.0)
Hemoglobin: 10.6 g/dL — ABNORMAL LOW (ref 12.0–15.0)
MCHC: 32.7 g/dL (ref 30.0–36.0)
MCV: 85.9 fL (ref 78.0–100.0)

## 2012-03-31 LAB — SAMPLE TO BLOOD BANK

## 2012-03-31 NOTE — ED Notes (Signed)
Pt. arrived by ems. Pt reports heavy period for 7 days, nvd for 5 days. Decreased oral intake. Seen on thurs for same. Given fluid. Hx of several past blood transfusions. 80/50 supine on scene. When sitting up pt will lose consciousness and lose radial pulses. 20 R hand. fluid. Pt positioned in trendelenburg. Last pressure 114/82. 12 lead shows NS.

## 2012-03-31 NOTE — ED Notes (Signed)
Pt to xray

## 2012-03-31 NOTE — ED Notes (Addendum)
Pt currently sitting up 45 degrees in bed. Pt placed in gown and on monitor

## 2012-03-31 NOTE — ED Provider Notes (Signed)
History     CSN: [id number]  Arrival date & time 03/31/12  2116   First MD Initiated Contact with Patient 03/31/12 2119      Chief Complaint  Patient presents with  . Hypotension    (Consider location/radiation/quality/duration/timing/severity/associated sxs/prior treatment) HPI Comments: Ms. Warshawsky presents for evaluation of a near syncopal event. She was seen here in the emergency department less than 48 hours ago for evaluation of weakness. She had experienced 5 days of nausea, vomiting, and diarrhea. She is also menstruating. She reports a long history of menorrhagia, dysmenorrhea, and he secondary chronic anemia. She has had previous blood transfusions but none recently. She states at home this evening she began feeling extremely weak and dizzy. When she attempted to stand she felt as if he were about to pass out. She then slumped to the floor without losing consciousness. She was unable to stand up once on the floor and it took several minutes to crawl out of the room. Since leaving the emergency department she states the vomiting and diarrhea had greatly improved however she still has very little appetite. She is the only a few crackers and a small container of applesauce over the last 48 hours. She denies any fever, chest pain, shortness of breath, or palpitations.  The history is provided by the patient. No language interpreter was used.    Past Medical History  Diagnosis Date  . Anemia   . HTN (hypertension)   . History of chicken pox   . Genital warts   . Status post blood transfusion without reported diagnosis 1998, 2002, 2005 x2    Past Surgical History  Procedure Date  . Myomectomy   . Cervical cone biopsy     cervical tumors    Family History  Problem Relation Age of Onset  . Breast cancer Other   . Uterine cancer Other   . Ovarian cancer Other   . Colon cancer Other   . Arthritis Other     History  Substance Use Topics  . Smoking status: Never Smoker   .  Smokeless tobacco: Not on file  . Alcohol Use: No    OB History    Grav Para Term Preterm Abortions TAB SAB Ect Mult Living                  Review of Systems  Constitutional: Positive for activity change, appetite change and fatigue. Negative for fever, chills, diaphoresis and unexpected weight change.  HENT: Negative for congestion, sore throat, rhinorrhea, trouble swallowing, neck pain, neck stiffness and dental problem.   Eyes: Negative.   Respiratory: Negative for apnea, cough, chest tightness, shortness of breath and wheezing.   Cardiovascular: Negative for chest pain and leg swelling.  Gastrointestinal: Positive for nausea, vomiting and diarrhea. Negative for abdominal pain, constipation and abdominal distention.  Genitourinary: Positive for vaginal bleeding. Negative for dysuria, hematuria, flank pain, vaginal discharge, enuresis, difficulty urinating, vaginal pain and pelvic pain.  Musculoskeletal: Positive for arthralgias. Negative for back pain, joint swelling and gait problem.  Neurological: Positive for weakness and light-headedness. Negative for tremors, syncope, speech difficulty, numbness and headaches.  Hematological: Negative for adenopathy. Does not bruise/bleed easily.  Psychiatric/Behavioral: Negative.     Allergies  Erythromycin; Moxifloxacin; Penicillins; and Tequin  Home Medications   Current Outpatient Rx  Name Route Sig Dispense Refill  . FERROUS GLUCONATE 325 MG PO TABS Oral Take 325 mg by mouth daily.      Marland Kitchen HYDRALAZINE HCL 100 MG PO  TABS Oral Take 100 mg by mouth 2 (two) times daily.     Marland Kitchen HYDROCHLOROTHIAZIDE 25 MG PO TABS Oral Take 25 mg by mouth daily.      Marland Kitchen LABETALOL HCL 300 MG PO TABS Oral Take 300 mg by mouth 2 (two) times daily.      Marland Kitchen LOPERAMIDE HCL 2 MG PO CAPS Oral Take 1 capsule (2 mg total) by mouth 4 (four) times daily as needed for diarrhea or loose stools. 12 capsule 0  . ONDANSETRON 4 MG PO TBDP Oral Take 1 tablet (4 mg total) by mouth  every 8 (eight) hours as needed for nausea. 10 tablet 0  . PRENATE ELITE 90-600-400 MG-MCG-MCG PO TABS Oral Take 1 tablet by mouth daily.      Marland Kitchen PROMETHAZINE HCL 25 MG RE SUPP Rectal Place 1 suppository (25 mg total) rectally every 6 (six) hours as needed for nausea. 6 each 0    BP 92/50  Pulse 77  Temp 98 F (36.7 C) (Oral)  SpO2 100%  LMP 03/24/2012  Physical Exam  Nursing note and vitals reviewed. Constitutional: She is oriented to person, place, and time. She appears well-developed and well-nourished. No distress.  HENT:  Head: Normocephalic and atraumatic.  Right Ear: External ear normal.  Left Ear: External ear normal.  Nose: Nose normal.  Mouth/Throat: Oropharynx is clear and moist. No oropharyngeal exudate.  Eyes: Conjunctivae and EOM are normal. Pupils are equal, round, and reactive to light. Right eye exhibits no discharge. Left eye exhibits no discharge. No scleral icterus.  Neck: Normal range of motion. Neck supple. No JVD present. No tracheal deviation present. No thyromegaly present.  Cardiovascular: Normal rate, regular rhythm, normal heart sounds and intact distal pulses.  Exam reveals no gallop and no friction rub.   No murmur heard. Pulmonary/Chest: Breath sounds normal. No stridor. No respiratory distress. She has no wheezes. She has no rales. She exhibits no tenderness.  Abdominal: Soft. Bowel sounds are normal. She exhibits no distension and no mass. There is no tenderness. There is no rebound and no guarding.  Musculoskeletal: Normal range of motion. She exhibits no edema and no tenderness.  Lymphadenopathy:    She has no cervical adenopathy.  Neurological: She is alert and oriented to person, place, and time. She has normal strength. She displays no atrophy and no tremor. No cranial nerve deficit or sensory deficit. She exhibits normal muscle tone. She displays a negative Romberg sign. She displays no seizure activity. Coordination normal. GCS eye subscore is 4.  GCS verbal subscore is 5. GCS motor subscore is 6.  Skin: Skin is warm, dry and intact. No abrasion and no rash noted. She is not diaphoretic. No cyanosis. No pallor. Nails show no clubbing.  Psychiatric: She has a normal mood and affect. Her behavior is normal. Judgment normal. Her mood appears not anxious. Her affect is not angry, not blunt, not labile and not inappropriate. Her speech is not rapid and/or pressured, not delayed, not tangential and not slurred. She is not agitated, not aggressive, is not hyperactive, not slowed, not withdrawn, not actively hallucinating and not combative. Cognition and memory are normal. Cognition and memory are not impaired. She does not exhibit a depressed mood. She is communicative. She exhibits normal recent memory and normal remote memory. She is attentive.    ED Course  Procedures (including critical care time)   Labs Reviewed  CBC  SAMPLE TO BLOOD BANK  BASIC METABOLIC PANEL  URINALYSIS, ROUTINE W REFLEX MICROSCOPIC  No results found.   No diagnosis found.   Date: 04/01/2012  Rate: 65 bpm  Rhythm: sinus  QRS Axis: normal  Intervals: borderline long QT int  ST/T Wave abnormalities: nonspecific ST changes  Conduction Disutrbances:none  Narrative Interpretation: + motion artifact, no evidence of acute ischemia  Old EKG Reviewed:        MDM  Pt presents for evaluation status post a near syncopal episode. She was evaluated within the last 48 hours here in the emergency department and treated for an elevated blood pressure. Her blood pressure however currently is not elevated. In fact it has been noted to be low. She at this time appears nontoxic-no acute distress. Given her history of significant anemia secondary to menorrhagia, hypertension, and renal insufficiency, will obtain basic labs, orthostatic vital signs, a chest x-ray, and EKG. She has received multiple transfusions in the past and am concerned that the symptoms may be secondary to  hypovolemia. She is currently menstruating.  0139.  Hypotension has completely resolved. Note hematuria without evidence of urinary tract infection. Red blood cells and urologically secondary to contamination the patient is currently menstruating. Note mild anemia with hemoglobin of 10.6 and hematocrit of 32.4. This is not significantly different from recently available labs. She does have a mild leukocytosis. Clinically there is no evidence of sepsis. Chest x-ray is clear without evidence of pneumonia. Patient has no historical or physical exam findings concerning for thromboembolic event either.  Concerned secondary to worsening hypokalemia and new onset renal failure.  IVF initiated.  Started PO K+ repletion but will transition to IV.  Plan Obs admission.   Tobin Chad, MD 04/01/12 [phone]

## 2012-04-01 DIAGNOSIS — K5900 Constipation, unspecified: Secondary | ICD-10-CM

## 2012-04-01 DIAGNOSIS — E876 Hypokalemia: Secondary | ICD-10-CM

## 2012-04-01 DIAGNOSIS — D649 Anemia, unspecified: Secondary | ICD-10-CM

## 2012-04-01 DIAGNOSIS — K529 Noninfective gastroenteritis and colitis, unspecified: Secondary | ICD-10-CM

## 2012-04-01 DIAGNOSIS — D509 Iron deficiency anemia, unspecified: Secondary | ICD-10-CM

## 2012-04-01 DIAGNOSIS — E860 Dehydration: Secondary | ICD-10-CM

## 2012-04-01 DIAGNOSIS — N179 Acute kidney failure, unspecified: Principal | ICD-10-CM

## 2012-04-01 DIAGNOSIS — R55 Syncope and collapse: Secondary | ICD-10-CM

## 2012-04-01 DIAGNOSIS — K5289 Other specified noninfective gastroenteritis and colitis: Secondary | ICD-10-CM

## 2012-04-01 DIAGNOSIS — E669 Obesity, unspecified: Secondary | ICD-10-CM

## 2012-04-01 DIAGNOSIS — M549 Dorsalgia, unspecified: Secondary | ICD-10-CM

## 2012-04-01 LAB — BASIC METABOLIC PANEL
BUN: 21 mg/dL (ref 6–23)
CO2: 30 mEq/L (ref 19–32)
Calcium: 9.1 mg/dL (ref 8.4–10.5)
GFR calc non Af Amer: 41 mL/min — ABNORMAL LOW (ref >90)
Glucose, Bld: 111 mg/dL — ABNORMAL HIGH (ref 70–99)

## 2012-04-01 LAB — CBC
HCT: 30.7 % — ABNORMAL LOW (ref 36.0–46.0)
MCH: 28.1 pg (ref 26.0–34.0)
MCV: 86.2 fL (ref 78.0–100.0)
Platelets: 342 10*3/uL (ref 150–400)
RDW: 14.7 % (ref 11.5–15.5)
WBC: 11.8 10*3/uL — ABNORMAL HIGH (ref 4.0–10.5)

## 2012-04-01 MED ORDER — LABETALOL HCL 300 MG PO TABS
300.0000 mg | ORAL_TABLET | Freq: Two times a day (BID) | ORAL | Status: DC
  Administered 2012-04-01 – 2012-04-02 (×3): 300 mg via ORAL
  Filled 2012-04-01 (×5): qty 1

## 2012-04-01 MED ORDER — WHITE PETROLATUM GEL
Status: AC
  Administered 2012-04-01: 11:00:00
  Filled 2012-04-01: qty 5

## 2012-04-01 MED ORDER — PROMETHAZINE HCL 25 MG RE SUPP
25.0000 mg | Freq: Four times a day (QID) | RECTAL | Status: DC | PRN
Start: 2012-04-01 — End: 2012-04-02

## 2012-04-01 MED ORDER — FLEET ENEMA 7-19 GM/118ML RE ENEM
1.0000 | ENEMA | Freq: Once | RECTAL | Status: DC
  Filled 2012-04-01: qty 1

## 2012-04-01 MED ORDER — HYDRALAZINE HCL 50 MG PO TABS
100.0000 mg | ORAL_TABLET | Freq: Two times a day (BID) | ORAL | Status: DC
  Administered 2012-04-01 – 2012-04-02 (×3): 100 mg via ORAL
  Filled 2012-04-01 (×5): qty 2

## 2012-04-01 MED ORDER — WHITE PETROLATUM GEL
Status: AC
  Administered 2012-04-01: 13:00:00
  Filled 2012-04-01: qty 5

## 2012-04-01 MED ORDER — SODIUM CHLORIDE 0.9 % IV BOLUS (SEPSIS)
1000.0000 mL | Freq: Once | INTRAVENOUS | Status: AC
  Administered 2012-04-01: 1000 mL via INTRAVENOUS

## 2012-04-01 MED ORDER — CLONIDINE HCL 0.1 MG PO TABS
0.1000 mg | ORAL_TABLET | Freq: Four times a day (QID) | ORAL | Status: DC | PRN
  Administered 2012-04-01: 0.1 mg via ORAL
  Filled 2012-04-01 (×2): qty 1

## 2012-04-01 MED ORDER — LACTULOSE ENEMA
300.0000 mL | Freq: Once | ORAL | Status: AC
  Administered 2012-04-01: 300 mL via RECTAL
  Filled 2012-04-01: qty 300

## 2012-04-01 MED ORDER — POLYETHYLENE GLYCOL 3350 17 G PO PACK
68.0000 g | PACK | Freq: Once | ORAL | Status: AC
  Administered 2012-04-01: 68 g via ORAL
  Filled 2012-04-01: qty 4

## 2012-04-01 MED ORDER — BISACODYL 5 MG PO TBEC
10.0000 mg | DELAYED_RELEASE_TABLET | Freq: Once | ORAL | Status: AC
  Administered 2012-04-01: 10 mg via ORAL
  Filled 2012-04-01: qty 2

## 2012-04-01 MED ORDER — ACETAMINOPHEN 650 MG RE SUPP: 650.0000 mg | Freq: Four times a day (QID) | RECTAL | Status: DC | PRN

## 2012-04-01 MED ORDER — BOOST / RESOURCE BREEZE PO LIQD
1.0000 | Freq: Two times a day (BID) | ORAL | Status: DC
  Administered 2012-04-01 – 2012-04-02 (×4): 1 via ORAL

## 2012-04-01 MED ORDER — ACETAMINOPHEN 325 MG PO TABS: 650.0000 mg | ORAL_TABLET | Freq: Four times a day (QID) | ORAL | Status: DC | PRN

## 2012-04-01 MED ORDER — LOPERAMIDE HCL 2 MG PO CAPS: 2.0000 mg | ORAL_CAPSULE | Freq: Four times a day (QID) | ORAL | Status: DC | PRN

## 2012-04-01 MED ORDER — POTASSIUM CHLORIDE CRYS ER 20 MEQ PO TBCR
40.0000 meq | EXTENDED_RELEASE_TABLET | Freq: Once | ORAL | Status: AC
  Administered 2012-04-01: 40 meq via ORAL
  Filled 2012-04-01: qty 2

## 2012-04-01 MED ORDER — ONDANSETRON 4 MG PO TBDP
4.0000 mg | ORAL_TABLET | Freq: Three times a day (TID) | ORAL | Status: DC | PRN
  Administered 2012-04-01: 4 mg via ORAL
  Filled 2012-04-01: qty 1

## 2012-04-01 MED ORDER — ENOXAPARIN SODIUM 40 MG/0.4ML ~~LOC~~ SOLN
40.0000 mg | SUBCUTANEOUS | Status: DC
  Administered 2012-04-01: 40 mg via SUBCUTANEOUS
  Filled 2012-04-01 (×2): qty 0.4

## 2012-04-01 MED ORDER — POTASSIUM CHLORIDE IN NACL 20-0.9 MEQ/L-% IV SOLN
INTRAVENOUS | Status: DC
  Administered 2012-04-01 (×2): via INTRAVENOUS
  Administered 2012-04-02: 100 mL via INTRAVENOUS
  Filled 2012-04-01 (×7): qty 1000

## 2012-04-01 NOTE — Progress Notes (Signed)
Utilization review completed.  

## 2012-04-01 NOTE — H&P (Signed)
PCP:   Sonda Primes, MD   Chief Complaint:  Presyncope, weakness  HPI: This is a 46 year old female who has been having nausea vomiting and diarrhea for approximately 6 days. She was here on Friday where she was treated with IV fluids and antiemetics and discharged home. Since then her diarrhea has resolved her nausea and vomiting is significantly improved, however, she continues to feel weak. Today while in the bathroom she fell because of weakness. She denies any fevers or chills and, any cough or shortness of breath. She came to the ER. [name].   Review of Systems:  The patient denies anorexia, fever, weight loss,, vision loss, decreased hearing, hoarseness, chest pain, syncope, dyspnea on exertion, peripheral edema, balance deficits, hemoptysis, abdominal pain, melena, hematochezia, severe indigestion/heartburn, hematuria, incontinence, genital sores, muscle weakness, suspicious skin lesions, transient blindness, difficulty walking, depression, unusual weight change, abnormal bleeding, enlarged lymph nodes, angioedema, and breast masses.  Past Medical History: Past Medical History  Diagnosis Date  . Anemia   . HTN (hypertension)   . History of chicken pox   . Genital warts   . Status post blood transfusion without reported diagnosis 1998, 2002, 2005 x2   Past Surgical History  Procedure Date  . Myomectomy   . Cervical cone biopsy     cervical tumors    Medications: Prior to Admission medications   Medication Sig Start Date End Date Taking? Authorizing Provider  ferrous gluconate (FERGON) 325 MG tablet Take 325 mg by mouth daily.     Yes Historical Provider, MD  hydrALAZINE (APRESOLINE) 100 MG tablet Take 100 mg by mouth 2 (two) times daily.    Yes Historical Provider, MD  hydrochlorothiazide 25 MG tablet Take 25 mg by mouth daily.     Yes Historical Provider, MD  labetalol (NORMODYNE) 300 MG tablet Take 300 mg by mouth 2 (two) times daily.     Yes  Historical Provider, MD  loperamide (IMODIUM) 2 MG capsule Take 1 capsule (2 mg total) by mouth 4 (four) times daily as needed for diarrhea or loose stools. 03/29/12 04/08/12 Yes Nicole Pisciotta, PA-C  ondansetron (ZOFRAN ODT) 4 MG disintegrating tablet Take 1 tablet (4 mg total) by mouth every 8 (eight) hours as needed for nausea. 03/29/12 04/05/12 Yes Nicole Pisciotta, PA-C  Prenat w/o A-FE-DSS-Methfol-FA (PRENATAL MULTIVITAMIN) 90-600-400 MG-MCG-MCG tablet Take 1 tablet by mouth daily.     Yes Historical Provider, MD  promethazine (PHENERGAN) 25 MG suppository Place 1 suppository (25 mg total) rectally every 6 (six) hours as needed for nausea. 03/29/12 04/05/12 Yes Nicole Pisciotta, PA-C    Allergies:   Allergies  Allergen Reactions  . Erythromycin Hives  . Moxifloxacin Hives  . Penicillins Hives  . Tequin (Gatifloxacin) Hives    Social History:  reports that she has never smoked. She does not have any smokeless tobacco history on file. She reports that she does not drink alcohol or use illicit drugs.  Family History: Family History  Problem Relation Age of Onset  . Breast cancer Other   . Uterine cancer Other   . Ovarian cancer Other   . Colon cancer Other   . Arthritis Other     Physical Exam: Filed Vitals:   03/31/12 2341 03/31/12 2353 04/01/12 0050 04/01/12 0151  BP: 118/61 161/75 163/89 146/84  Pulse: 92  82 96  Temp:      TempSrc:      Resp: 18 12 18    SpO2: 100%  100%  General:  Alert and oriented times three, well developed and nourished, no acute distress Eyes: PERRLA, pink conjunctiva, no scleral icterus ENT: Dry oral mucosa, neck supple, no thyromegaly Lungs: clear to ascultation, no wheeze, no crackles, no use of accessory muscles Cardiovascular: regular rate and rhythm, no regurgitation, no gallops, no murmurs. No carotid bruits, no JVD Abdomen: soft, positive BS, non-tender, non-distended, no organomegaly, not an acute abdomen GU: not examined Neuro: CN II  - XII grossly intact, sensation intact Musculoskeletal: strength 5/5 all extremities, no clubbing, cyanosis or edema Skin: no rash, no subcutaneous crepitation, no decubitus Psych: appropriate patient   Labs on Admission:   [facility] 03/31/12 2226 03/29/12 0403  NA 135 134*  K 2.9* 3.1*  CL 95* 94*  CO2 28 30  GLUCOSE 90 159*  BUN 20 11  CREATININE 2.01* 0.97  CALCIUM 9.2 9.9  MG -- --  PHOS -- --    Basename 03/29/12 0403  AST 21  ALT 18  ALKPHOS 78  BILITOT 0.5  PROT 8.7*  ALBUMIN 4.2   No results found for this basename: LIPASE:2,AMYLASE:2 in the last 72 hours  Basename 03/31/12 2226 03/29/12 0403  WBC 13.8* 11.0*  NEUTROABS -- 9.4*  HGB 10.6* 11.3*  HCT 32.4* 34.2*  MCV 85.9 85.9  PLT 329 337   No results found for this basename: CKTOTAL:3,CKMB:3,CKMBINDEX:3,TROPONINI:3 in the last 72 hours No components found with this basename: POCBNP:3 No results found for this basename: DDIMER:2 in the last 72 hours No results found for this basename: HGBA1C:2 in the last 72 hours No results found for this basename: CHOL:2,HDL:2,LDLCALC:2,TRIG:2,CHOLHDL:2,LDLDIRECT:2 in the last 72 hours No results found for this basename: TSH,T4TOTAL,FREET3,T3FREE,THYROIDAB in the last 72 hours No results found for this basename: VITAMINB12:2,FOLATE:2,FERRITIN:2,TIBC:2,IRON:2,RETICCTPCT:2 in the last 72 hours  Micro Results: No results found for this or any previous visit (from the past 240 hour(s)).  Results for Jasmine, Stafford (MRN [id number]) as of 04/01/2012 02:35  Ref. Range 03/31/2012 23:27  Color, Urine Latest Range: YELLOW  YELLOW  APPearance Latest Range: CLEAR  CLOUDY (A)  Specific Gravity, Urine Latest Range: 1.005-1.030  1.008  pH Latest Range: 5.0-8.0  6.5  Glucose Latest Range: NEGATIVE mg/dL NEGATIVE  Bilirubin Urine Latest Range: NEGATIVE  NEGATIVE  Ketones, ur Latest Range: NEGATIVE mg/dL NEGATIVE  Protein Latest Range: NEGATIVE mg/dL NEGATIVE  Urobilinogen, UA Latest  Range: 0.0-1.0 mg/dL 0.2  Nitrite Latest Range: NEGATIVE  NEGATIVE  Leukocytes, UA Latest Range: NEGATIVE  NEGATIVE  Hgb urine dipstick Latest Range: NEGATIVE  LARGE (A)  WBC, UA Latest Range: <3 WBC/hpf 3-6  RBC / HPF Latest Range: <3 RBC/hpf TOO NUMEROUS TO COUNT  Squamous Epithelial / LPF Latest Range: RARE  RARE  Bacteria, UA Latest Range: RARE  RARE    Radiological Exams on Admission: Dg Chest 2 View  03/31/2012  *RADIOLOGY REPORT*  Clinical Data: Near-syncope  CHEST - 2 VIEW  Comparison: None  Findings: Enlargement of cardiac silhouette. Tortuous aorta. Pulmonary vascularity normal. Lungs clear. No pleural effusion or pneumothorax. Bones unremarkable.  IMPRESSION: Enlargement of cardiac silhouette. No acute abnormalities.   Original Report Authenticated By: Lollie Marrow, M.D.     Assessment/Plan Present on Admission:  .Acute kidney injury Gastroenteritis - resolving  Dehydration Presyncope Hypokalemia Admit to telemetry  Patient's gastroenteritis resolving, likely viral in the region Will replete potassium IV, continue IV hydration CBC and BMP in a.m.  No clear source of infection as stated likely viral. No antibiotics started Check a magnesium level  .  HYPERTENSION Morbid obesity Stable resume home medications except for hydrochlorothiazide   Full code DVT prophylaxis   Jasmine Stafford 04/01/2012, 2:33 AM

## 2012-04-01 NOTE — Progress Notes (Signed)
Received patient from ED,admitted due to nausea,vomiting and diarrhea and fall at home.Patient alert and oriented x3.Placed on telemetry.Patient advised to call for any assist or before getting up to go to bathroom.Patient refused to have bed alarm turned on.Call bell within reach.Will continue to monitor. Apostolos Blagg Joselita,RN

## 2012-04-01 NOTE — Progress Notes (Signed)
INITIAL ADULT NUTRITION ASSESSMENT Date: 04/01/2012   Time: 10:21 AM  Reason for Assessment: Malnutrition Screening   INTERVENTION: 1. Resource Breeze po BID, each supplement provides 250 kcal and 9 grams of protein. 2. Monitor magnesium, potassium, and phosphorus daily for at least 3 days, MD to replete as needed, as pt is at risk for refeeding syndrome given dx of acute severe malnutrition. 3. Encouraged bland foods to promote better tolerance 4. RD to continue to follow nutrition care plan  DOCUMENTATION CODES Per approved criteria  -Severe malnutrition in the context of acute illness or injury -Morbid Obesity    ASSESSMENT: Female 46 y.o.  Dx: weakness and n/v  Hx:  Past Medical History  Diagnosis Date  . Anemia   . HTN (hypertension)   . History of chicken pox   . Genital warts   . Status post blood transfusion without reported diagnosis 1998, 2002, 2005 x2   Past Surgical History  Procedure Date  . Myomectomy   . Cervical cone biopsy     cervical tumors   Related Meds:     . enoxaparin (LOVENOX) injection  40 mg Subcutaneous Q24H  . hydrALAZINE  100 mg Oral BID  . labetalol  300 mg Oral BID  . potassium chloride  40 mEq Oral Once  . sodium chloride  1,000 mL Intravenous Once   Ht: 5\' 4"  (162.6 cm)  Wt: 261 lb 9.6 oz (118.661 kg)  Ideal Wt: 54.5 kg % Ideal Wt: 218%  Wt Readings from Last 15 Encounters:  04/01/12 261 lb 9.6 oz (118.661 kg)  10/21/10 269 lb (122.018 kg)  06/29/10 271 lb (122.925 kg)  05/21/10 272 lb (123.378 kg)  Usual Wt: 270 lb per pt % Usual Wt: 97%  Body mass index is 44.90 kg/(m^2). Pt is obese, class III.  Food/Nutrition Related Hx: poor intake x 1 week  Labs:  CMP     Component Value Date/Time   NA 135 03/31/2012 2226   K 2.9* 03/31/2012 2226   CL 95* 03/31/2012 2226   CO2 28 03/31/2012 2226   GLUCOSE 90 03/31/2012 2226   BUN 20 03/31/2012 2226   CREATININE 2.01* 03/31/2012 2226   CALCIUM 9.2 03/31/2012 2226   PROT 8.7* 03/29/2012  0403   ALBUMIN 4.2 03/29/2012 0403   AST 21 03/29/2012 0403   ALT 18 03/29/2012 0403   ALKPHOS 78 03/29/2012 0403   BILITOT 0.5 03/29/2012 0403   GFRNONAA 29* 03/31/2012 2226   GFRAA 33* 03/31/2012 2226   Sodium  Date/Time Value Range Status  03/31/2012 10:26 PM 135  135 - 145 mEq/L Final  03/29/2012  4:03 AM 134* 135 - 145 mEq/L Final    Potassium  Date/Time Value Range Status  03/31/2012 10:26 PM 2.9* 3.5 - 5.1 mEq/L Final  03/29/2012  4:03 AM 3.1* 3.5 - 5.1 mEq/L Final    No results found for this basename: phos    Magnesium  Date/Time Value Range Status  03/31/2012 10:26 PM 2.8* 1.5 - 2.5 mg/dL Final      Intake/Output Summary (Last 24 hours) at 04/01/12 1023 Last data filed at 04/01/12 0900  Gross per 24 hour  Intake   2025 ml  Output      0 ml  Net   2025 ml   Diet Order: Cardiac  Supplements/Tube Feeding: none  IVF:    0.9 % NaCl with KCl 20 mEq / L Last Rate: 100 mL/hr at 04/01/12 0517    Estimated Nutritional Needs:  Kcal: 1800 - 2000 kcal Protein:  70 - 80 grams Fluid: 1.8 - 2 liters daily  Pt with n/v/d x 6 days. Per patient, n/v has improved, however she feels weak. Pt is s/p fall 2/2 weakness.  Pt states that she has had a weight loss of 9 lb x 1 week. This is a weight change of 3% and is significant. Dietary recall reveals bites of applesauce, fruit, jello, and chicken noodle soup x 5 days. Yesterday and today pt has been able to tolerate more. Consumed the [place] from a [place] sandwich and a fruit cup yesterday. For breakfast today, she consumed some of her eggs and a small bagel. Pt meets criteria for severe malnutrition in the context of acute illness as evidenced by 3% wt loss x 1 week, intake of <50% of estimated energy needs x at least 5 days, and likely muscle mass and/or fat loss given ongoing weakness and fall.  Agreeable to [place] daily. Discussed bland foods.  Noted potassium and magnesium are currently low. Recommend monitoring of refeeding labs  (potassium, magnesium, and phosphorus) given dx of malnutrition and recent suboptimal PO intake.  NUTRITION DIAGNOSIS: -Inadequate oral intake (NI-2.1).  Status: Ongoing  RELATED TO: GI distress  AS EVIDENCE BY: pt report of inadequate intake and weight loss x 1 week  MONITORING/EVALUATION(Goals): Goal: Pt to meet >/= 90% of their estimated nutrition needs Monitor: weight trends, lab trends, I/O's, PO intake, supplement tolerance  EDUCATION NEEDS: -Education needs addressed  Jarold Motto MS, RD, LDN Pager: [phone] After-hours pager: [phone]

## 2012-04-01 NOTE — Progress Notes (Addendum)
[facility] PROGRESS NOTE  Jasmine Stafford ZOX:[id number] DOB: Dec 22, 1965 DOA: 03/31/2012 PCP: Jasmine Primes, MD  Assessment/Plan: Active Problems:  HYPERTENSION  Acute kidney injury  Hypokalemia  Dehydration  Gastroenteritis  Obesity (BMI 35.0-39.9 without comorbidity)  Acute kidney injury:  Likely due to dehydration after severe vomiting and diarrhea.  Patient has received intravenous fluids overnight.  No thrombocytopenia or blood in stools to suggest HUS. -  BMP with magnesium this AM  Hypokalemia:  Likely due to GI losses.   Consider adrenal insufficiency if orthostasis and electrolyte imbalance persist despite IVF. -  Check potassium this AM  Orthostasis:  Patient no longer feels lightheaded -  Recheck orthostatics  Constipation:   -  Lactulose enema (given AKI) -  Miralax and bisacodyl  Hypertension, uncontrolled:   -  Continue labetalol and hydralazine -  Will restart home BP meds after rehydrated  Anemia, normocytic.  Chronic due to menses per patient and managed by PCP. -  Defer to outpatient management and evaluation.    DIET:  Healthy heart ACCESS:  PIV Proph:  Lovenox  Code Status: Full  Family Communication: Spoke with patient and her fiance Disposition Plan:  Pending improvement in orthostatics, constipation, hypokalemia, and kidney function.     Brief narrative:  This is a 46 year old female who has been having nausea vomiting and diarrhea for approximately 6 days. She was here on Friday where she was treated with IV fluids and antiemetics and discharged home. Since then her diarrhea has resolved her nausea and vomiting is significantly improved, however, she continues to feel weak. Today while in the bathroom she fell because of weakness. She denies any fevers or chills and, any cough or shortness of breath. She came to the ER. [name].     Consultants:  None  Procedures:  None  Antibiotics:  None  HPI/Subjective:  Patient states that she feels improved this morning.  Denies lightheadedness, nausea, recent vomiting.  She feels constipated.  Last BM was 3 days ago.    Objective: Filed Vitals:   04/01/12 0050 04/01/12 0151 04/01/12 0415 04/01/12 1000  BP: 163/89 146/84 156/87 152/81  Pulse: 82 96 78 86  Temp:   99 F (37.2 C) 99.1 F (37.3 C)  TempSrc:   Oral Oral  Resp: 18  18 20   Height:   5\' 4"  (1.626 m)   Weight:   118.661 kg (261 lb 9.6 oz)   SpO2: 100%  96% 98%    Intake/Output Summary (Last 24 hours) at 04/01/12 1042 Last data filed at 04/01/12 0900  Gross per 24 hour  Intake   2025 ml  Output      0 ml  Net   2025 ml   Filed Weights   04/01/12 0415  Weight: 118.661 kg (261 lb 9.6 oz)    Exam:   General:  Obese AAF, no acute distress, lying in bed  HEENT:  PERRL, anicteric, MMM  Neck:  Supple, no lymphadenopathy  Cardiovascular: RRR, no murmurs, rubs, or gallops  Respiratory: CTAB  Back: No CVA tenderness  Abdomen: NABS, soft, feels pressure with deep palpation.  No rebound or gurading  MSK:  Trace pitting LEE, 2+ pulses  SKin:  Moist  Neuro:  Grossly intact  Psych:  A&Ox4  Data Reviewed: Basic Metabolic Panel:  Lab 03/31/12 8119 03/29/12 0403  NA 135 134*  K 2.9* 3.1*  CL 95* 94*  CO2 28 30  GLUCOSE 90 159*  BUN 20 11  CREATININE 2.01* 0.97  CALCIUM 9.2 9.9  MG 2.8* --  PHOS -- --   Liver Function Tests:  Lab 03/29/12 0403  AST 21  ALT 18  ALKPHOS 78  BILITOT 0.5  PROT 8.7*  ALBUMIN 4.2   No results found for this basename: LIPASE:5,AMYLASE:5 in the last 168 hours No results found for this basename: AMMONIA:5 in the last 168 hours CBC:  Lab 03/31/12 2226 03/29/12 0403  WBC 13.8* 11.0*  NEUTROABS -- 9.4*  HGB 10.6* 11.3*  HCT 32.4* 34.2*  MCV 85.9 85.9  PLT 329 337   Cardiac Enzymes: No results found for this basename:  CKTOTAL:5,CKMB:5,CKMBINDEX:5,TROPONINI:5 in the last 168 hours BNP (last 3 results) No results found for this basename: PROBNP:3 in the last 8760 hours CBG: No results found for this basename: GLUCAP:5 in the last 168 hours  No results found for this or any previous visit (from the past 240 hour(s)).   Studies: Dg Chest 2 View  03/31/2012  *RADIOLOGY REPORT*  Clinical Data: Near-syncope  CHEST - 2 VIEW  Comparison: None  Findings: Enlargement of cardiac silhouette. Tortuous aorta. Pulmonary vascularity normal. Lungs clear. No pleural effusion or pneumothorax. Bones unremarkable.  IMPRESSION: Enlargement of cardiac silhouette. No acute abnormalities.   Original Report Authenticated By: Lollie Marrow, M.D.     Scheduled Meds:   . bisacodyl  10 mg Oral Once  . enoxaparin (LOVENOX) injection  40 mg Subcutaneous Q24H  . feeding supplement  1 Container Oral BID BM  . hydrALAZINE  100 mg Oral BID  . labetalol  300 mg Oral BID  . polyethylene glycol  68 g Oral Once  . potassium chloride  40 mEq Oral Once  . sodium chloride  1,000 mL Intravenous Once  . sodium phosphate  1 enema Rectal Once   Continuous Infusions:   . 0.9 % NaCl with KCl 20 mEq / L 100 mL/hr at 04/01/12 1610    Active Problems:  HYPERTENSION  Acute kidney injury  Hypokalemia  Dehydration  Gastroenteritis  Obesity (BMI 35.0-39.9 without comorbidity)    Time spent: 30    Jasmine Stafford, [facility]  [facility] Pager [phone]. If 8PM-8AM, please contact night-coverage at [email or web address], password [facility] 04/01/2012, 10:42 AM  LOS: 1 day

## 2012-04-02 DIAGNOSIS — K5900 Constipation, unspecified: Secondary | ICD-10-CM

## 2012-04-02 LAB — CBC
HCT: 27.5 % — ABNORMAL LOW (ref 36.0–46.0)
Hemoglobin: 8.8 g/dL — ABNORMAL LOW (ref 12.0–15.0)
MCH: 28.2 pg (ref 26.0–34.0)
MCHC: 32 g/dL (ref 30.0–36.0)
RDW: 15.1 % (ref 11.5–15.5)

## 2012-04-02 LAB — BASIC METABOLIC PANEL
BUN: 24 mg/dL — ABNORMAL HIGH (ref 6–23)
Calcium: 8.7 mg/dL (ref 8.4–10.5)
GFR calc Af Amer: 49 mL/min — ABNORMAL LOW (ref >90)
GFR calc non Af Amer: 42 mL/min — ABNORMAL LOW (ref >90)
Glucose, Bld: 75 mg/dL (ref 70–99)
Potassium: 3.2 mEq/L — ABNORMAL LOW (ref 3.5–5.1)

## 2012-04-02 MED ORDER — POTASSIUM CHLORIDE CRYS ER 20 MEQ PO TBCR
20.0000 meq | EXTENDED_RELEASE_TABLET | Freq: Once | ORAL | Status: AC
  Administered 2012-04-02: 20 meq via ORAL
  Filled 2012-04-02: qty 1

## 2012-04-02 MED ORDER — POTASSIUM CHLORIDE CRYS ER 20 MEQ PO TBCR
40.0000 meq | EXTENDED_RELEASE_TABLET | Freq: Once | ORAL | Status: AC
  Administered 2012-04-02: 40 meq via ORAL
  Filled 2012-04-02: qty 2

## 2012-04-02 NOTE — Discharge Summary (Signed)
Physician Discharge Summary  Jasmine Stafford ZOX:[id number] DOB: 1965/09/13 DOA: 03/31/2012  PCP: Sonda Primes, MD  Admit date: 03/31/2012 Discharge date: 04/02/2012  Recommendations for Outpatient Follow-up:  Please follow up with primary care doctor within one to two days for repeat CBC (evaluate for worsening anemia), BMP (BUN:Cr), and urinalysis while no longer menstruating.    Discharge Diagnoses:  Active Problems:  HYPERTENSION  Acute kidney injury  Hypokalemia  Dehydration  Gastroenteritis  Obesity (BMI 35.0-39.9 without comorbidity)  Constipation  Anemia   Discharge Condition: stable, improved  Diet recommendation:  High potassium foods until you follow up with your primary care doctor.  Drink at least 3L of water or noncaffeinated fluids per day until you follow up with your primary care doctor.   Wt Readings from Last 3 Encounters:  04/01/12 119.886 kg (264 lb 4.8 oz)  10/21/10 122.018 kg (269 lb)  06/29/10 122.925 kg (271 lb)    History of present illness:   This is a 46 year old female who has been having nausea vomiting and diarrhea for approximately 6 days. She was here on Friday where she was treated with IV fluids and antiemetics and discharged home. Since then her diarrhea has resolved her nausea and vomiting is significantly improved, however, she continues to feel weak. Today while in the bathroom she fell because of weakness. She denies any fevers or chills and, any cough or shortness of breath. She came to the ER. [name].    Hospital Course:   Nausea and vomiting.  Resolved with IV fluids and zofran.  May have been due to gastroenteritis and hypokalemia.  Will send patient home with prescription for zofran.  She is able to tolerate enough PO fluids at this time to drink maintenance equivalent fluids and she is no longer have emesis or diarrhea.  Orthostasis resolved with IVF.  Hypokalemia: Likely due to GI losses and improved slowly with  repletion.  She was given IV fluids with potassium and she was given oral repletion daily.  She was given of oral potassium on the day of discharge.  This was likely contributing to her muscle weakness and her constipation/ileus.  Acute kidney injury:  Also resolving with IV fluids.  Creatinine was 2.01 at admission and trended down to 1.47.  She had numerous RBC on urinalysis but is on her menses.  She is not acidotic, however her BUN has risen from 20 to 24.  No evidence of thrombocytopenia to suggest HUS and she has not taken any new medications.  Would continue aggressive hydration at home by mouth with close outpatient follow up.  Estimated GFR at discharge is 49.  She should avoid NSAIDS and other over the counter prescriptions until she follows up with her primary care doctor.    Constipation:  May have been due to hypokalemic ileus.  Patient had bowel movements after potassium repletion, enema, miralax, and bisacodyl.   Hypertension, uncontrolled at presentation.  She was hypertensive to the 170s systolic.  She was given a dose of clonidine acutely and continued on her labetalol and hydralazine.  HCTZ was held due to dehydration.  Her blood pressure was likely elevated because of acute illness and not being able to take her home medications.  Her blood pressures have been in the 130s/50-60s for the last twenty four hours.    Anemia, normocytic. Patient's hemoglobin trended down from 10 to 8.8 from 9/2 to 9/3, the change appears to be porportional to the other cell lines  and is likely dilutional.  She has some active bleeding (menses) which are resolving and she takes iron for iron-deficiency secondary to this problem.  No other signs of hemolysis or bleeding.  Patient should have close outpatient follow up for a repeat CBC, however.    Procedures:  None  Consultations:  None  Discharge Exam: Filed Vitals:   04/02/12 1300  BP: 132/62  Pulse: 78  Temp: 98 F (36.7 C)  Resp: 18    Filed Vitals:   04/01/12 2211 04/02/12 0607 04/02/12 1000 04/02/12 1300  BP: 100/71 110/61 136/56 132/62  Pulse: 79 74 82 78  Temp:  98.5 F (36.9 C) 98.4 F (36.9 C) 98 F (36.7 C)  TempSrc:  Oral Oral Oral  Resp:  18 18 18   Height:      Weight:      SpO2:  94% 97% 98%    General: Obese AAF, no acute distress, sitting at edge of bed HEENT: PERRL, anicteric, MMM  Neck: Supple, no lymphadenopathy  Cardiovascular: RRR, 2/6 murmur LSB, no rubs, or gallops  Respiratory: CTAB  Back: No CVA tenderness  Abdomen: Hyperactive bowel sounds, soft, nontender, nondistended. No rebound or guarding  MSK: Trace pitting LEE, 2+ pulses  SKin: Moist  Psych: A&Ox4   Discharge Instructions  Discharge Orders    Future Appointments: Provider: Department: Dept Phone: Center:   04/03/2012 2:45 PM Tresa Garter, MD [facility] [phone] [facility]     Future Orders Please Complete By Expires   Diet - low sodium heart healthy      Increase activity slowly      Discharge instructions      Comments:   You were hospitalized for severe dehydration due to nausea and vomiting (and previously diarrhea).  You had injury to your kidneys.  Your blood pressure medication HCTZ was not given to you because it can worsen dehydration and you were given IV fluids.  Your potassium was low and you were given intravenous and oral potassium supplements.  Your potassium and kidney function have improved, but are not back to normal.  You need to see your primary care doctor in close follow up for this hospitalization.  Additionally, you had some blood in your urine, however, you were on your period during the collection.  Please have your primary care doctor repeat this in the next two weeks to make sure the blood has disappeared (likely contaminate).  He should also repeat a blood count to make sure your anemia has stabilized.  You had constipation which resolved with medications.  Please drink at least 3L of  noncaffeinated fluids daily and eat foods with potassium until you see your primary care doctor (MUST be within two days).   Call MD for:  temperature >100.4      Call MD for:  persistant nausea and vomiting      Call MD for:      Comments:   Call 911 if you experience chest pains with shortness of breath or symptoms of stroke, such as slurred speech, confusion, facial droop, or numbness or weakness of an arm or leg.   Call MD for:  severe uncontrolled pain      Call MD for:  difficulty breathing, headache or visual disturbances      Call MD for:  hives      Call MD for:  persistant dizziness or light-headedness      Call MD for:  extreme fatigue        Medication  List  As of 04/02/2012  3:55 PM   STOP taking these medications         hydrochlorothiazide 25 MG tablet      loperamide 2 MG capsule         TAKE these medications         ferrous gluconate 325 MG tablet   Commonly known as: FERGON   Take 325 mg by mouth daily.      hydrALAZINE 100 MG tablet   Commonly known as: APRESOLINE   Take 100 mg by mouth 2 (two) times daily.      labetalol 300 MG tablet   Commonly known as: NORMODYNE   Take 300 mg by mouth 2 (two) times daily.      ondansetron 4 MG disintegrating tablet   Commonly known as: ZOFRAN-ODT   Take 1 tablet (4 mg total) by mouth every 8 (eight) hours as needed for nausea.      prenatal multivitamin 90-600-400 MG-MCG-MCG tablet   Take 1 tablet by mouth daily.      promethazine 25 MG suppository   Commonly known as: PHENERGAN   Place 1 suppository (25 mg total) rectally every 6 (six) hours as needed for nausea.           Follow-up Information    Follow up with Sonda Primes, MD. Schedule an appointment as soon as possible for a visit in 2 days.   Contact information:   [address] [facility] [address] [place] [address] [place] [address] [phone]           The results of significant diagnostics from this hospitalization (including  imaging, microbiology, ancillary and laboratory) are listed below for reference.    Significant Diagnostic Studies: Dg Chest 2 View  03/31/2012  *RADIOLOGY REPORT*  Clinical Data: Near-syncope  CHEST - 2 VIEW  Comparison: None  Findings: Enlargement of cardiac silhouette. Tortuous aorta. Pulmonary vascularity normal. Lungs clear. No pleural effusion or pneumothorax. Bones unremarkable.  IMPRESSION: Enlargement of cardiac silhouette. No acute abnormalities.   Original Report Authenticated By: Lollie Marrow, M.D.     Microbiology: No results found for this or any previous visit (from the past 240 hour(s)).   Labs: Basic Metabolic Panel:  Lab 04/02/12 9629 04/01/12 1150 03/31/12 2226 03/29/12 0403  NA 140 136 135 134*  K 3.2* 2.8* 2.9* 3.1*  CL 104 97 95* 94*  CO2 29 30 28 30   GLUCOSE 75 111* 90 159*  BUN 24* 21 20 11   CREATININE 1.47* 1.51* 2.01* 0.97  CALCIUM 8.7 9.1 9.2 9.9  MG 2.6* 2.7* 2.8* --  PHOS -- -- -- --   Liver Function Tests:  Lab 03/29/12 0403  AST 21  ALT 18  ALKPHOS 78  BILITOT 0.5  PROT 8.7*  ALBUMIN 4.2   No results found for this basename: LIPASE:5,AMYLASE:5 in the last 168 hours No results found for this basename: AMMONIA:5 in the last 168 hours CBC:  Lab 04/02/12 0530 04/01/12 1150 03/31/12 2226 03/29/12 0403  WBC 8.9 11.8* 13.8* 11.0*  NEUTROABS -- -- -- 9.4*  HGB 8.8* 10.0* 10.6* 11.3*  HCT 27.5* 30.7* 32.4* 34.2*  MCV 88.1 86.2 85.9 85.9  PLT 316 342 329 337   Cardiac Enzymes: No results found for this basename: CKTOTAL:5,CKMB:5,CKMBINDEX:5,TROPONINI:5 in the last 168 hours BNP: BNP (last 3 results) No results found for this basename: PROBNP:3 in the last 8760 hours CBG: No results found for this basename: GLUCAP:5 in the last 168  hours  Time coordinating discharge:  45 minutes  Signed:  Vanna Shavers  [facility] 04/02/2012, 3:55 PM

## 2012-04-02 NOTE — Progress Notes (Signed)
Pt discharged to home after visit summary reviewed and pt capable of re verbalizing medications and follow up appointments. Pt remains stable. No signs and symptoms of distress. Educated to return to ER in the event of SOB, dizziness, chest pain, or fainting. Avriana Joo, RN   

## 2012-04-03 ENCOUNTER — Encounter: Payer: Self-pay | Admitting: Internal Medicine

## 2012-04-03 ENCOUNTER — Other Ambulatory Visit (INDEPENDENT_AMBULATORY_CARE_PROVIDER_SITE_OTHER): Payer: BC Managed Care – PPO

## 2012-04-03 ENCOUNTER — Ambulatory Visit (INDEPENDENT_AMBULATORY_CARE_PROVIDER_SITE_OTHER): Payer: BC Managed Care – PPO | Admitting: Internal Medicine

## 2012-04-03 VITALS — BP 116/60 | HR 80 | Temp 98.6°F | Resp 16 | Wt 274.0 lb

## 2012-04-03 DIAGNOSIS — I10 Essential (primary) hypertension: Secondary | ICD-10-CM

## 2012-04-03 DIAGNOSIS — E860 Dehydration: Secondary | ICD-10-CM

## 2012-04-03 DIAGNOSIS — K5289 Other specified noninfective gastroenteritis and colitis: Secondary | ICD-10-CM

## 2012-04-03 DIAGNOSIS — K529 Noninfective gastroenteritis and colitis, unspecified: Secondary | ICD-10-CM

## 2012-04-03 DIAGNOSIS — E876 Hypokalemia: Secondary | ICD-10-CM

## 2012-04-03 DIAGNOSIS — D649 Anemia, unspecified: Secondary | ICD-10-CM

## 2012-04-03 LAB — IBC PANEL
Iron: 19 ug/dL — ABNORMAL LOW (ref 42–145)
Saturation Ratios: 5 % — ABNORMAL LOW (ref 20.0–50.0)

## 2012-04-03 LAB — BASIC METABOLIC PANEL
Calcium: 8.3 mg/dL — ABNORMAL LOW (ref 8.4–10.5)
GFR: 77.09 mL/min (ref >60.00)
Potassium: 3.5 mEq/L (ref 3.5–5.1)
Sodium: 134 mEq/L — ABNORMAL LOW (ref 135–145)

## 2012-04-03 LAB — CBC WITH DIFFERENTIAL/PLATELET
Basophils Absolute: 0.1 10*3/uL (ref 0.0–0.1)
Eosinophils Relative: 1.5 % (ref 0.0–5.0)
Hemoglobin: 8.5 g/dL — ABNORMAL LOW (ref 12.0–15.0)
Lymphocytes Relative: 22.1 % (ref 12.0–46.0)
Monocytes Relative: 7.3 % (ref 3.0–12.0)
Platelets: 291 10*3/uL (ref 150.0–400.0)
RDW: 15.3 % — ABNORMAL HIGH (ref 11.5–14.6)
WBC: 7.7 10*3/uL (ref 4.5–10.5)

## 2012-04-03 MED ORDER — ALIGN 4 MG PO CAPS: 1.0000 | ORAL_CAPSULE | Freq: Every day | ORAL | Status: DC

## 2012-04-03 NOTE — Assessment & Plan Note (Signed)
Corrected IV

## 2012-04-03 NOTE — Progress Notes (Signed)
Subjective:    Patient ID: Jasmine Stafford, female    DOB: 1966-06-18, 46 y.o.   MRN: [id number]  HPI F/u hosp stay - adm on 03/31/12:  This is a 46 year old female who has been having nausea vomiting and diarrhea for approximately 6 days. She was here on Friday where she was treated with IV fluids and antiemetics and discharged home. Since then her diarrhea has resolved her nausea and vomiting is significantly improved, however, she continues to feel weak. Today while in the bathroom she fell because of weakness. She denies any fevers or chills and, any cough or shortness of breath. She came to the ER. [name].  Hospital Course:  Nausea and vomiting. Resolved with IV fluids and zofran. May have been due to gastroenteritis and hypokalemia. Will send patient home with prescription for zofran. She is able to tolerate enough PO fluids at this time to drink maintenance equivalent fluids and she is no longer have emesis or diarrhea. Orthostasis resolved with IVF.  Hypokalemia: Likely due to GI losses and improved slowly with repletion. She was given IV fluids with potassium and she was given oral repletion daily. She was given of oral potassium on the day of discharge. This was likely contributing to her muscle weakness and her constipation/ileus.  Acute kidney injury: Also resolving with IV fluids. Creatinine was 2.01 at admission and trended down to 1.47. She had numerous RBC on urinalysis but is on her menses. She is not acidotic, however her BUN has risen from 20 to 24. No evidence of thrombocytopenia to suggest HUS and she has not taken any new medications. Would continue aggressive hydration at home by mouth with close outpatient follow up. Estimated GFR at discharge is 49. She should avoid NSAIDS and other over the counter prescriptions until she follows up with her primary care doctor.  Constipation: May have been due to hypokalemic ileus. Patient had bowel movements after  potassium repletion, enema, miralax, and bisacodyl.  Hypertension, uncontrolled at presentation. She was hypertensive to the 170s systolic. She was given a dose of clonidine acutely and continued on her labetalol and hydralazine. HCTZ was held due to dehydration. Her blood pressure was likely elevated because of acute illness and not being able to take her home medications. Her blood pressures have been in the 130s/50-60s for the last twenty four hours.  Anemia, normocytic. Patient's hemoglobin trended down from 10 to 8.8 from 9/2 to 9/3, the change appears to be porportional to the other cell lines and is likely dilutional. She has some active bleeding (menses) which are resolving and she takes iron for iron-deficiency secondary to this problem. No other signs of hemolysis or bleeding. Patient should have close outpatient follow up for a repeat CBC, however.  Procedures:  None Consultations:  None Discharge Exam:  Filed Vitals:    04/02/12 1300   BP:  132/62   Pulse:  78   Temp:  98 F (36.7 C)   Resp:  18    Filed Vitals:    04/01/12 2211  04/02/12 0607  04/02/12 1000  04/02/12 1300   BP:  100/71  110/61  136/56  132/62   Pulse:  79  74  82  78   Temp:   98.5 F (36.9 C)  98.4 F (36.9 C)  98 F (36.7 C)   TempSrc:   Oral  Oral  Oral   Resp:   18  18  18    Height:  Weight:       SpO2:   94%  97%  98%   General: Obese AAF, no acute distress, sitting at edge of bed  HEENT: PERRL, anicteric, MMM  Neck: Supple, no lymphadenopathy  Cardiovascular: RRR, 2/6 murmur LSB, no rubs, or gallops  Respiratory: CTAB  Back: No CVA tenderness  Abdomen: Hyperactive bowel sounds, soft, nontender, nondistended. No rebound or guarding  MSK: Trace pitting LEE, 2+ pulses  SKin: Moist  Psych: A&Ox4 Discharge Instructions  Discharge Orders    Future Appointments:  Provider:  Department:  Dept Phone:  Center:    04/03/2012 2:45 PM  Tresa Garter, MD  [facility]  [phone]  [facility]        Future Orders  Please Complete By  Expires    Diet - low sodium heart healthy      Increase activity slowly      Discharge instructions      Comments:           Review of Systems  Constitutional: Positive for fatigue.  HENT: Negative for hearing loss, ear pain, congestion, sore throat, rhinorrhea, sneezing, trouble swallowing and postnasal drip.   Eyes: Negative for pain.  Respiratory: Negative for cough and shortness of breath.        Objective:   Physical Exam  Constitutional: She appears well-developed. No distress.       Obese  HENT:  Head: Normocephalic.  Right Ear: External ear normal. Tympanic membrane is not erythematous. A middle ear effusion is present.  Left Ear: External ear normal. Tympanic membrane is not erythematous. A middle ear effusion is present.  Nose: Mucosal edema and rhinorrhea present.  Mouth/Throat: Mucous membranes are not pale and not cyanotic. No dental caries. Posterior oropharyngeal edema and posterior oropharyngeal erythema present. No oropharyngeal exudate or tonsillar abscesses.  Eyes: Conjunctivae are normal. Pupils are equal, round, and reactive to light. Right eye exhibits no discharge. Left eye exhibits no discharge.  Neck: Normal range of motion. Neck supple. No JVD present. No tracheal deviation present. No thyromegaly present.  Cardiovascular: Normal rate, regular rhythm and normal heart sounds.   No murmur heard. Pulmonary/Chest: No stridor. No respiratory distress. She has no wheezes.  Abdominal: Soft. Bowel sounds are normal. She exhibits no distension and no mass. There is no tenderness. There is no rebound and no guarding.  Musculoskeletal: She exhibits no edema and no tenderness.  Lymphadenopathy:    She has no cervical adenopathy.  Neurological: She displays normal reflexes. No cranial nerve deficit. She exhibits normal muscle tone. Coordination normal.  Skin: No rash noted. She is not diaphoretic. No erythema.  Psychiatric:  She has a normal mood and affect. Her behavior is normal. Judgment and thought content normal.   Lab Results  Component Value Date   WBC 8.9 04/02/2012   HGB 8.8* 04/02/2012   HCT 27.5* 04/02/2012   PLT 316 04/02/2012   GLUCOSE 75 04/02/2012   ALT 18 03/29/2012   AST 21 03/29/2012   NA 140 04/02/2012   K 3.2* 04/02/2012   CL 104 04/02/2012   CREATININE 1.47* 04/02/2012   BUN 24* 04/02/2012   CO2 29 04/02/2012          Assessment & Plan:  No problem-specific assessment & plan notes found for this encounter.

## 2012-04-03 NOTE — Assessment & Plan Note (Signed)
Treated inpatient 

## 2012-04-03 NOTE — Assessment & Plan Note (Signed)
Check CBC 

## 2012-04-03 NOTE — Assessment & Plan Note (Signed)
Labs Not on KCl now

## 2012-04-03 NOTE — Assessment & Plan Note (Signed)
Continue with current prescription therapy as reflected on the Med list. Labs  

## 2012-04-04 ENCOUNTER — Telehealth: Payer: Self-pay | Admitting: Internal Medicine

## 2012-04-04 NOTE — Telephone Encounter (Signed)
Jasmine Stafford, please, inform patient that all labs are normal except for low iron and worsening anemia I ould like her to see a hematologist. Is it ok for me to ef? Re-start iron Thx

## 2012-04-04 NOTE — Telephone Encounter (Signed)
Pt informed. She states the referral is not necessary.

## 2012-06-24 ENCOUNTER — Other Ambulatory Visit: Payer: Self-pay | Admitting: Obstetrics and Gynecology

## 2012-06-24 DIAGNOSIS — Z1231 Encounter for screening mammogram for malignant neoplasm of breast: Secondary | ICD-10-CM

## 2012-07-10 ENCOUNTER — Ambulatory Visit
Admission: RE | Admit: 2012-07-10 | Discharge: 2012-07-10 | Disposition: A | Discharge status: Home / Self Care | Payer: BC Managed Care – PPO | Source: Ambulatory Visit | Attending: Obstetrics and Gynecology | Admitting: Obstetrics and Gynecology

## 2012-07-10 DIAGNOSIS — Z1231 Encounter for screening mammogram for malignant neoplasm of breast: Secondary | ICD-10-CM

## 2012-07-19 ENCOUNTER — Other Ambulatory Visit: Payer: Self-pay | Admitting: Obstetrics and Gynecology

## 2012-07-19 DIAGNOSIS — R928 Other abnormal and inconclusive findings on diagnostic imaging of breast: Secondary | ICD-10-CM

## 2012-07-25 ENCOUNTER — Emergency Department (HOSPITAL_COMMUNITY)
Admission: EM | Admit: 2012-07-25 | Discharge: 2012-07-25 | Disposition: A | Discharge status: Home / Self Care | Payer: BC Managed Care – PPO | Source: Home / Self Care | Attending: Family Medicine | Admitting: Family Medicine

## 2012-07-25 ENCOUNTER — Encounter (HOSPITAL_COMMUNITY): Payer: Self-pay | Admitting: *Deleted

## 2012-07-25 DIAGNOSIS — J209 Acute bronchitis, unspecified: Secondary | ICD-10-CM

## 2012-07-25 MED ORDER — DOXYCYCLINE HYCLATE 100 MG PO CAPS: 100.0000 mg | ORAL_CAPSULE | Freq: Two times a day (BID) | ORAL | Status: DC

## 2012-07-25 NOTE — ED Notes (Signed)
C/o rawness in her throat onset 12/24.  She coughs with a deep breath. States she is hearing a gurgly sound.  Has been taking Mucinex.

## 2012-07-25 NOTE — ED Provider Notes (Addendum)
History     CSN: [id number]  Arrival date & time 07/25/12  1643   None     Chief Complaint  Patient presents with  . Bronchitis    (Consider location/radiation/quality/duration/timing/severity/associated sxs/prior treatment) Patient is a 46 y.o. female presenting with cough. The history is provided by the patient.  Cough This is a new problem. The current episode started 2 days ago. The problem has been gradually worsening. The cough is productive of sputum. There has been no fever. Pertinent negatives include no chest pain, no chills, no rhinorrhea, no shortness of breath and no wheezing. She is not a smoker. Her past medical history is significant for bronchitis.    Past Medical History  Diagnosis Date  . Anemia   . HTN (hypertension)   . History of chicken pox   . Genital warts   . Status post blood transfusion without reported diagnosis 1998, 2002, 2005 x2    Past Surgical History  Procedure Date  . Myomectomy   . Cervical cone biopsy     cervical tumors    Family History  Problem Relation Age of Onset  . Uterine cancer Other   . Ovarian cancer Other   . Arthritis Other   . Breast cancer Mother   . Ovarian cancer Mother   . Diabetes Father   . Hypertension Father   . Diabetes Brother   . Colon cancer Maternal Aunt     History  Substance Use Topics  . Smoking status: Never Smoker   . Smokeless tobacco: Not on file  . Alcohol Use: No    OB History    Grav Para Term Preterm Abortions TAB SAB Ect Mult Living                  Review of Systems  Constitutional: Negative for chills.  HENT: Negative for rhinorrhea.   Respiratory: Positive for cough. Negative for shortness of breath and wheezing.   Cardiovascular: Negative for chest pain.    Allergies  Erythromycin; Moxifloxacin; Penicillins; and Tequin  Home Medications   Current Outpatient Rx  Name  Route  Sig  Dispense  Refill  . FERROUS GLUCONATE 325 MG PO TABS   Oral   Take 325 mg by  mouth daily.           Marland Kitchen HYDRALAZINE HCL 100 MG PO TABS   Oral   Take 100 mg by mouth 2 (two) times daily.          Marland Kitchen HYDROCHLOROTHIAZIDE 25 MG PO TABS   Oral   Take 25 mg by mouth daily.         Marland Kitchen LABETALOL HCL 300 MG PO TABS   Oral   Take 300 mg by mouth 2 (two) times daily.           Marland Kitchen PRENATE ELITE 90-600-400 MG-MCG-MCG PO TABS   Oral   Take 1 tablet by mouth daily.           Marland Kitchen DOXYCYCLINE HYCLATE 100 MG PO CAPS   Oral   Take 1 capsule (100 mg total) by mouth 2 (two) times daily.   20 capsule   0   . ALIGN 4 MG PO CAPS   Oral   Take 1 capsule by mouth daily.   30 capsule   0   . PROMETHAZINE HCL 25 MG RE SUPP   Rectal   Place 1 suppository (25 mg total) rectally every 6 (six) hours as needed for nausea.   6  each   0     BP 131/66  Pulse 74  Temp 99.3 F (37.4 C) (Oral)  Resp 18  SpO2 94%  LMP 06/29/2012  Physical Exam  Nursing note and vitals reviewed. Constitutional: She is oriented to person, place, and time. She appears well-developed and well-nourished.  HENT:  Head: Normocephalic.  Right Ear: External ear normal.  Left Ear: External ear normal.  Mouth/Throat: Oropharynx is clear and moist.  Eyes: Conjunctivae normal are normal. Pupils are equal, round, and reactive to light.  Neck: Normal range of motion. Neck supple.  Cardiovascular: Normal rate, regular rhythm, normal heart sounds and intact distal pulses.   Pulmonary/Chest: Effort normal and breath sounds normal.  Lymphadenopathy:    She has no cervical adenopathy.  Neurological: She is alert and oriented to person, place, and time.  Skin: Skin is warm and dry.    ED Course  Procedures (including critical care time)  Labs Reviewed - No data to display No results found.   1. Bronchitis, acute       MDM          Linna Hoff, MD 07/25/12 1916  Linna Hoff, MD 07/31/12 1950

## 2012-07-31 DIAGNOSIS — C50919 Malignant neoplasm of unspecified site of unspecified female breast: Secondary | ICD-10-CM

## 2012-07-31 HISTORY — DX: Malignant neoplasm of unspecified site of unspecified female breast: C50.919

## 2012-08-09 ENCOUNTER — Other Ambulatory Visit: Payer: Self-pay | Admitting: Obstetrics and Gynecology

## 2012-08-09 ENCOUNTER — Ambulatory Visit
Admission: RE | Admit: 2012-08-09 | Discharge: 2012-08-09 | Disposition: A | Discharge status: Home / Self Care | Payer: BC Managed Care – PPO | Source: Ambulatory Visit | Attending: Obstetrics and Gynecology | Admitting: Obstetrics and Gynecology

## 2012-08-09 DIAGNOSIS — R928 Other abnormal and inconclusive findings on diagnostic imaging of breast: Secondary | ICD-10-CM

## 2012-08-09 DIAGNOSIS — N6310 Unspecified lump in the right breast, unspecified quadrant: Secondary | ICD-10-CM

## 2012-08-13 ENCOUNTER — Ambulatory Visit
Admission: RE | Admit: 2012-08-13 | Discharge: 2012-08-13 | Disposition: A | Discharge status: Home / Self Care | Payer: BC Managed Care – PPO | Source: Ambulatory Visit | Attending: Obstetrics and Gynecology | Admitting: Obstetrics and Gynecology

## 2012-08-13 ENCOUNTER — Other Ambulatory Visit: Payer: Self-pay | Admitting: Obstetrics and Gynecology

## 2012-08-13 DIAGNOSIS — R928 Other abnormal and inconclusive findings on diagnostic imaging of breast: Secondary | ICD-10-CM

## 2012-08-13 DIAGNOSIS — N6310 Unspecified lump in the right breast, unspecified quadrant: Secondary | ICD-10-CM

## 2012-08-13 DIAGNOSIS — C50911 Malignant neoplasm of unspecified site of right female breast: Secondary | ICD-10-CM

## 2012-08-15 ENCOUNTER — Telehealth: Payer: Self-pay | Admitting: *Deleted

## 2012-08-15 DIAGNOSIS — C50419 Malignant neoplasm of upper-outer quadrant of unspecified female breast: Secondary | ICD-10-CM | POA: Insufficient documentation

## 2012-08-15 NOTE — Telephone Encounter (Signed)
Confirmed [facility] for 08/21/12 at 1200 .  Instructions and contact information given.  

## 2012-08-16 ENCOUNTER — Ambulatory Visit
Admission: RE | Admit: 2012-08-16 | Discharge: 2012-08-16 | Disposition: A | Discharge status: Home / Self Care | Payer: BC Managed Care – PPO | Source: Ambulatory Visit | Attending: Obstetrics and Gynecology | Admitting: Obstetrics and Gynecology

## 2012-08-16 DIAGNOSIS — C50911 Malignant neoplasm of unspecified site of right female breast: Secondary | ICD-10-CM

## 2012-08-16 MED ORDER — GADOBENATE DIMEGLUMINE 529 MG/ML IV SOLN
20.0000 mL | Freq: Once | INTRAVENOUS | Status: AC | PRN
Start: 2012-08-16 — End: 2012-08-16
  Administered 2012-08-16: 20 mL via INTRAVENOUS

## 2012-08-21 ENCOUNTER — Encounter: Payer: Self-pay | Admitting: Specialist

## 2012-08-21 ENCOUNTER — Ambulatory Visit (HOSPITAL_BASED_OUTPATIENT_CLINIC_OR_DEPARTMENT_OTHER): Payer: BC Managed Care – PPO | Admitting: General Surgery

## 2012-08-21 ENCOUNTER — Encounter: Payer: Self-pay | Admitting: Oncology

## 2012-08-21 ENCOUNTER — Other Ambulatory Visit (HOSPITAL_BASED_OUTPATIENT_CLINIC_OR_DEPARTMENT_OTHER): Payer: BC Managed Care – PPO | Admitting: Lab

## 2012-08-21 ENCOUNTER — Ambulatory Visit (HOSPITAL_BASED_OUTPATIENT_CLINIC_OR_DEPARTMENT_OTHER): Payer: BC Managed Care – PPO | Admitting: Oncology

## 2012-08-21 ENCOUNTER — Ambulatory Visit
Admission: RE | Admit: 2012-08-21 | Discharge: 2012-08-21 | Disposition: A | Discharge status: Home / Self Care | Payer: BC Managed Care – PPO | Source: Ambulatory Visit | Attending: Radiation Oncology | Admitting: Radiation Oncology

## 2012-08-21 ENCOUNTER — Ambulatory Visit: Payer: BC Managed Care – PPO

## 2012-08-21 ENCOUNTER — Ambulatory Visit: Payer: BC Managed Care – PPO | Attending: General Surgery | Admitting: Physical Therapy

## 2012-08-21 ENCOUNTER — Encounter: Payer: Self-pay | Admitting: *Deleted

## 2012-08-21 VITALS — BP 122/70 | HR 83 | Temp 98.6°F | Resp 20 | Ht 63.5 in | Wt 260.8 lb

## 2012-08-21 DIAGNOSIS — I10 Essential (primary) hypertension: Secondary | ICD-10-CM

## 2012-08-21 DIAGNOSIS — C50419 Malignant neoplasm of upper-outer quadrant of unspecified female breast: Secondary | ICD-10-CM

## 2012-08-21 DIAGNOSIS — C50919 Malignant neoplasm of unspecified site of unspecified female breast: Secondary | ICD-10-CM | POA: Insufficient documentation

## 2012-08-21 DIAGNOSIS — R293 Abnormal posture: Secondary | ICD-10-CM | POA: Insufficient documentation

## 2012-08-21 DIAGNOSIS — Z170 Estrogen receptor positive status [ER+]: Secondary | ICD-10-CM

## 2012-08-21 DIAGNOSIS — IMO0001 Reserved for inherently not codable concepts without codable children: Secondary | ICD-10-CM | POA: Insufficient documentation

## 2012-08-21 LAB — CBC WITH DIFFERENTIAL/PLATELET
BASO%: 1 % (ref 0.0–2.0)
Basophils Absolute: 0.1 10*3/uL (ref 0.0–0.1)
Eosinophils Absolute: 0.1 10*3/uL (ref 0.0–0.5)
HCT: 28.6 % — ABNORMAL LOW (ref 34.8–46.6)
HGB: 9.1 g/dL — ABNORMAL LOW (ref 11.6–15.9)
LYMPH%: 25.4 % (ref 14.0–49.7)
MCHC: 31.9 g/dL (ref 31.5–36.0)
MONO#: 0.6 10*3/uL (ref 0.1–0.9)
NEUT%: 61.7 % (ref 38.4–76.8)
Platelets: 368 10*3/uL (ref 145–400)
WBC: 5.8 10*3/uL (ref 3.9–10.3)

## 2012-08-21 LAB — COMPREHENSIVE METABOLIC PANEL (CC13)
BUN: 9 mg/dL (ref 7.0–26.0)
CO2: 25 mEq/L (ref 22–29)
Calcium: 9.5 mg/dL (ref 8.4–10.4)
Creatinine: 1 mg/dL (ref 0.6–1.1)
Glucose: 97 mg/dl (ref 70–99)
Total Bilirubin: 0.42 mg/dL (ref 0.20–1.20)

## 2012-08-21 NOTE — Progress Notes (Signed)
Jasmine Stafford [id number] May 15, 1966 46 y.o. 08/21/2012 2:50 PM  CC  Sonda Primes, MD [address] [facility] [address] [place] [address] [place] [address] Dr. Dorothy Puffer  Dr. Glenna Fellows  REASON FOR CONSULTATION:  47 year old female with new diagnosis of stage II invasive lobular carcinoma that is ER positive PR positive HER-2/neu negative Patient was seen in the Multidisciplinary Breast Clinic for discussion of her treatment options.  STAGE:   Cancer of upper-outer quadrant of female breast   Primary site: Breast (Right)   Staging method: AJCC 7th Edition   Clinical: Stage IIB (T3, N0, cM0)   Summary: Stage IIB (T3, N0, cM0)  REFERRING PHYSICIAN: Dr. Sharlet Salina Hoxworth  HISTORY OF PRESENT ILLNESS:  Jasmine Stafford is a 47 y.o. female.  Without significant past medical history who recently underwent a screening mammogram and was found to have a suspicious area within the upper-outer quadrant of the right breast. Additional studies confirmed a 3 cm focus of distortion at the 10:00 position. Ultrasound showed a 3.2 cm regular hypoechoic mass. Ultrasound guided core biopsy showed invasive lobular carcinoma grade 1 tumor was ER positive PR positive HER-2/neu negative. There was also atypical lobular hyperplasia noted. She underwent MRI of the breasts that showed large area of concern measures 7.2 x 6.6 x 3.7 cm. No evidence of any suspicious adenopathy. Patient's case was discussed at the multidisciplinary breast conference and her radiology and pathology have been reviewed thoroughly. She is without any complaints.   Past Medical History: Past Medical History  Diagnosis Date  . Anemia   . HTN (hypertension)   . History of chicken pox   . Genital warts   . Status post blood transfusion without reported diagnosis 1998, 2002, 2005 x2  . Breast cancer     Past Surgical History: Past Surgical History  Procedure Date  . Myomectomy   . Cervical cone biopsy     cervical tumors     Family History: Family History  Problem Relation Age of Onset  . Uterine cancer Other   . Ovarian cancer Other   . Arthritis Other   . Breast cancer Other   . Breast cancer Mother   . Ovarian cancer Mother   . Diabetes Father   . Hypertension Father   . Prostate cancer Father   . Diabetes Brother   . Colon cancer Maternal Aunt   . Breast cancer Maternal Aunt   . Rectal cancer Maternal Aunt   . Brain cancer Paternal Aunt   . Stomach cancer Paternal Uncle   . Lung cancer Paternal Uncle   . Lung cancer Maternal Grandfather   . Breast cancer Cousin     Social History History  Substance Use Topics  . Smoking status: Never Smoker   . Smokeless tobacco: Not on file  . Alcohol Use: No    Allergies: Allergies  Allergen Reactions  . Other     Tomatoes & Mushrooms  . Shellfish Allergy   . Erythromycin Hives  . Moxifloxacin Hives  . Penicillins Hives  . Tequin (Gatifloxacin) Hives    Current Medications: Current Outpatient Prescriptions  Medication Sig Dispense Refill  . COD LIVER OIL PO Take 530 mg by mouth daily.      Marland Kitchen DANDELION PO Take 520 mg by mouth daily.      . Misc Natural Products (RED CLOVER COMBINATION PO) Take 375 mg by mouth daily.      . Multiple Vitamins-Iron (CHLORELLA PO) Take 4 g by mouth  daily.      . NON FORMULARY 1 each daily. Fibrovan      . doxycycline (VIBRAMYCIN) 100 MG capsule Take 1 capsule (100 mg total) by mouth 2 (two) times daily.  20 capsule  0  . ferrous gluconate (FERGON) 325 MG tablet Take 325 mg by mouth daily.        . hydrALAZINE (APRESOLINE) 100 MG tablet Take 100 mg by mouth 2 (two) times daily.       . hydrochlorothiazide (HYDRODIURIL) 25 MG tablet Take 25 mg by mouth daily.      Marland Kitchen labetalol (NORMODYNE) 300 MG tablet Take 300 mg by mouth 2 (two) times daily.        Burnis Medin w/o A-FE-DSS-Methfol-FA (PRENATAL MULTIVITAMIN) 90-600-400 MG-MCG-MCG tablet Take 1 tablet by mouth daily.        . Probiotic Product (ALIGN) 4 MG CAPS  Take 1 capsule by mouth daily.  30 capsule  0  . promethazine (PHENERGAN) 25 MG suppository Place 1 suppository (25 mg total) rectally every 6 (six) hours as needed for nausea.  6 each  0    OB/GYN History: Menarche at age 5 patient is premenopausal last menstrual period was on January 4 1 2014. She is nulliparous  Fertility Discussion: Not applicable Prior History of Cancer: No prior history of cancers  Health Maintenance:  Colonoscopy no Bone Density no Last PAP smear November 2012  ECOG PERFORMANCE STATUS: 0 - Asymptomatic  Genetic Counseling/testing: Patient's family history is reviewed and she is noted to have strong family history of cancers and therefore she is referred to genetic counseling  REVIEW OF SYSTEMS:  A comprehensive review of systems was negative.  PHYSICAL EXAMINATION: Blood pressure 122/70, pulse 83, temperature 98.6 F (37 C), temperature source Oral, resp. rate 20, height 5' 3.5" (1.613 m), weight 260 lb 12.8 oz (118.298 kg), last menstrual period 06/29/2012.  ZOX:WRUEA, healthy, no distress, well nourished and well developed SKIN: skin color, texture, turgor are normal HEAD: Normocephalic EYES: PERRLA, EOMI EARS: External ears normal OROPHARYNX:no exudate and no erythema  NECK: no adenopathy LYMPH:  no palpable lymphadenopathy, no hepatosplenomegaly BREAST:left breast normal without mass, skin or nipple changes or axillary nodes, abnormal mass palpable in her right breast consistent with a hematoma LUNGS: clear to auscultation and percussion HEART: regular rate & rhythm ABDOMEN:abdomen soft, non-tender, normal bowel sounds and no masses or organomegaly BACK: Back symmetric, no curvature., No CVA tenderness EXTREMITIES:no edema, no clubbing, no cyanosis  NEURO: alert & oriented x 3 with fluent speech, no focal motor/sensory deficits, gait normal, reflexes normal and symmetric     STUDIES/RESULTS: US Breast Right  08/09/2012  *RADIOLOGY REPORT*   Clinical Data:  possible right breast mass  DIGITAL DIAGNOSTIC RIGHT MAMMOGRAM  AND RIGHT BREAST ULTRASOUND:  Comparison:  multiple prior studies including 07/10/12  Findings:  ACR Breast Density Category 3: The breast tissue is heterogeneously dense.  There is a persistent 3cm focus of architectural distoriton in the 10 o'clock position of the right breast posteriorly.  On physical exam, there is subtle firmness in the upper outer quadrant of the right breast  Ultrasound is performed, showing an irregular hypoechoic mass in the 10 o'clock position of the right breast 11cm from the nipple. It causes posterior acoustic shadowing.  It measures 32 x 20 x 21mm.  Ultrasound of the right axilla is negative.  IMPRESSION: Suspicious right breast mass.  BI-RADS CATEGORY 4:  Suspicious abnormality - biopsy should be considered.  RECOMMENDATION: The patient  will undergo ultrasound-guided core needle biopsy this morning.  I have discussed the findings and recommendations with the patient. Results were also provided in writing at the conclusion of the visit.   Original Report Authenticated By: Esperanza Heir, M.D.    Mr Breast Bilateral W Wo Contrast  08/16/2012  *RADIOLOGY REPORT*  Clinical Data: Recently diagnosed right breast invasive lobular carcinoma.  BUN and creatinine were obtained on site at [facility] at [address] Results:  BUN 7 mg/dL,  Creatinine 1.0 mg/dL.  BILATERAL BREAST MRI WITH AND WITHOUT CONTRAST  Technique: Multiplanar, multisequence MR images of both breasts were obtained prior to and following the intravenous administration of 20ml of MultiHance.  Three dimensional images were evaluated at the independent DynaCad workstation.  Comparison:  Previous and current mammogram, ultrasound and biopsy examinations.  Findings: Mild to moderate background parenchymal enhancement in both breasts with a mild nodular pattern.  Multiple small cysts in both breasts.  7.2 x 6.6 x 3.7 cm irregular area  of nodular and non mass enhancement in the upper outer right breast.  This contains a biopsy marker clip artifact and corresponds to the recently biopsied invasive lobular carcinoma.  This has a mixture of enhancement kinetics, including a small amount of rapid washin/washout.  No additional masses or areas of enhancement suspicious for malignancy in either breast.  No abnormal appearing lymph nodes.  IMPRESSION:  1.  7.6 x 6.6 x 3.7 cm biopsy-proven invasive lobular carcinoma in the upper outer right breast. 2.  No evidence of malignancy on the left and no adenopathy.  RECOMMENDATION: Treatment plan  THREE-DIMENSIONAL MR IMAGE RENDERING ON INDEPENDENT WORKSTATION:  Three-dimensional MR images were rendered by post-processing of the original MR data on an independent workstation.  The three- dimensional MR images were interpreted, and findings were reported in the accompanying complete MRI report for this study.  BI-RADS CATEGORY 6:  Known biopsy-proven malignancy - appropriate action should be taken.   Original Report Authenticated By: Beckie Salts, M.D.    Mm Digital Diag Ltd R  08/09/2012  *RADIOLOGY REPORT*  Clinical Data:  possible right breast mass  DIGITAL DIAGNOSTIC RIGHT MAMMOGRAM  AND RIGHT BREAST ULTRASOUND:  Comparison:  multiple prior studies including 07/10/12  Findings:  ACR Breast Density Category 3: The breast tissue is heterogeneously dense.  There is a persistent 3cm focus of architectural distoriton in the 10 o'clock position of the right breast posteriorly.  On physical exam, there is subtle firmness in the upper outer quadrant of the right breast  Ultrasound is performed, showing an irregular hypoechoic mass in the 10 o'clock position of the right breast 11cm from the nipple. It causes posterior acoustic shadowing.  It measures 32 x 20 x 21mm.  Ultrasound of the right axilla is negative.  IMPRESSION: Suspicious right breast mass.  BI-RADS CATEGORY 4:  Suspicious abnormality - biopsy should be  considered.  RECOMMENDATION: The patient will undergo ultrasound-guided core needle biopsy this morning.  I have discussed the findings and recommendations with the patient. Results were also provided in writing at the conclusion of the visit.   Original Report Authenticated By: Esperanza Heir, M.D.    Mm Digital Diagnostic Unilat R  08/09/2012  *RADIOLOGY REPORT*  Clinical Data:  right breast mass  DIGITAL DIAGNOSTIC RIGHT MAMMOGRAM  Comparison:  Previous exams.  Findings:  Films are performed following ultrasound guided biopsy of the mass in the 10 o'clock position of the right breast.  Marker clip (ribbon-shaped) projects in the anticipated  position on two views.  IMPRESSION: Appropriate clip positioning.   Original Report Authenticated By: Esperanza Heir, M.D.    Mm Radiologist Eval And Mgmt  08/13/2012  *RADIOLOGY REPORT*  ESTABLISHED PATIENT OFFICE VISIT - LEVEL II [phone])  Chief Complaint:  patient returns for review of biopsy results  History:  The patient underwent ultrasound-guided core needle biopsy on 08/09/12 of a mass in the 10 o'clock position of the right breast.  Exam:  A bandaid covers the biopsy site, which is clean and dry. The patient denies any pain, and there is no visible bruising.  Pathology: Pathologic results indicate the presence of invasive lobular carcinoma, which is concordant with the imaging appearance.  Assessment and Plan:  The patient was given an appointment for breast mri on 08/16/12, and an appointment for the [facility] on 08/21/12. I answered all her questions and invited her to contact me at the [facility] if I could be of any further assistance.   Original Report Authenticated By: Esperanza Heir, M.D.    US Breast Bx W Loc Dev 1st Lesion Img Bx Spec US Guide  08/09/2012  *RADIOLOGY REPORT*  Clinical Data:  mass 10:00 right breast 11cm from nipple  ULTRASOUND GUIDED VACUUM ASSISTED CORE BIOPSY OF THE RIGHT BREAST  Comparison: Previous exams.  I met with the patient and we discussed  the procedure of ultrasound- guided biopsy, including benefits and alternatives.  We discussed the high likelihood of a successful procedure. We discussed the risks of the procedure including infection, bleeding, tissue injury, clip migration, and inadequate sampling.  Informed written consent was given.  Using sterile technique, 2% lidocaine ultrasound guidance and a 12 gauge vacuum assisted needle biopsy was performed of the right breast mass using an inferolateral to superomedial  approach.  At the conclusion of the procedure, a  tissue marker clip was deployed into the biopsy cavity.  Follow-up 2-view mammogram was performed and dictated separately.  IMPRESSION: Ultrasound-guided biopsy of right breast mass.  No apparent complications.   Original Report Authenticated By: Esperanza Heir, M.D.      LABS:    Chemistry      Component Value Date/Time   NA 140 08/21/2012 1225   NA 134* 04/03/2012 1600   K 3.5 08/21/2012 1225   K 3.5 04/03/2012 1600   CL 105 08/21/2012 1225   CL 105 04/03/2012 1600   CO2 25 08/21/2012 1225   CO2 25 04/03/2012 1600   BUN 9.0 08/21/2012 1225   BUN 18 04/03/2012 1600   CREATININE 1.0 08/21/2012 1225   CREATININE 1.0 04/03/2012 1600      Component Value Date/Time   CALCIUM 9.5 08/21/2012 1225   CALCIUM 8.3* 04/03/2012 1600   ALKPHOS 64 08/21/2012 1225   ALKPHOS 78 03/29/2012 0403   AST 19 08/21/2012 1225   AST 21 03/29/2012 0403   ALT 22 08/21/2012 1225   ALT 18 03/29/2012 0403   BILITOT 0.42 08/21/2012 1225   BILITOT 0.5 03/29/2012 0403      Lab Results  Component Value Date   WBC 5.8 08/21/2012   HGB 9.1* 08/21/2012   HCT 28.6* 08/21/2012   MCV 81.2 08/21/2012   PLT 368 08/21/2012   PATHOLOGY: ADDITIONAL INFORMATION: CHROMOGENIC IN-SITU HYBRIDIZATION Interpretation HER-2/NEU BY CISH - NO AMPLIFICATION OF HER-2 DETECTED. THE RATIO OF HER-2: CEP 17 SIGNALS WAS 1.30. Abigail Miyamoto MD Pathologist, Electronic Signature ( Signed 08/15/2012) FINAL  DIAGNOSIS Diagnosis Breast, right, needle core biopsy, mass, 10 o'clock - INVASIVE LOBULAR CARCINOMA. - ATYPICAL  LOBULAR HYPERPLASIA PRESENT. - SEE COMMENT. Microscopic Comment Immunohistochemical stains are performed. A cytokeratin AE1/AE3 stain highlights infiltrative single cells while an E-cadherin stain is negative in these cells. The morphology coupled with the staining pattern is consistent with the above diagnosis. Although the grade of the tumor is best assessed on excision specimen, the features of the tumor in the needle core biopsy are consistent with a grade I to II carcinoma. A breast prognostic profile will be performed and reported in an addendum. The findings are called to [facility] on 08/13/12. Dr. Colonel Bald has seen this case in consultation with agreement. (RAH:gt, 08/13/12)  ASSESSMENT    47 year old female with  #1 new diagnosis of invasive lobular carcinoma of the right breast found on a routine screening mammogram in January 2014. Patient is now in the multidisciplinary breast clinic for discussion of treatment options. Her tumor was ER positive PR positive HER-2/neu negative. MRI showed an area of extent to be significant at 7.2 x 6.6 x 3.7 cm irregular area of nodular and non-enhancement in the upper-outer quadrant of the right breast. This is concerning for extensive disease. Due to extensive disease patient is recommended a mastectomy with delayed reconstruction for possible radiation later on. She will need sentinel lymph node biopsy performed. This was discussed with her by her surgery.  #2 I discussed antiestrogen therapy for invasive lobular carcinoma patient did have a low grade tumor and I do think that she may do well with just antiestrogen therapy. We discussed the rationale for this. However I will make a final decision based on her final pathology.  #3 she will need genetic counseling and testing and she was referred to the genetic counselor  today.  Clinical Trial Eligibility: no  Multidisciplinary conference discussion yes    PLAN:    #1 proceed with her surgery consisting of a mastectomy and sentinel lymph node biopsy with delayed reconstruction.  #2 genetic testing.  #3 I will see her back after her surgery.     Thank you so much for allowing me to participate in the care of Jasmine Stafford. I will continue to follow up the patient with you and assist in her care.  All questions were answered. The patient knows to call the clinic with any problems, questions or concerns. We can certainly see the patient much sooner if necessary.  I spent 55 minutes counseling the patient face to face. The total time spent in the appointment was 60 minutes.  Drue Second, MD Medical/Oncology [facility] [phone] (beeper) [phone] (Office)  08/21/2012, 2:51 PM

## 2012-08-21 NOTE — Progress Notes (Signed)
I met the patient, along with her fiance and sister, at today's breast clinic.  In reviewing the distress screening, Ms. Jasmine Stafford said that her distress level was a ".5", indicating she has very little distress about her diagnosis.  Her affect, to me, appeared very flat.  The patient said she had not yet decided if she would follow the recommended treatment plan, which includes a right mastectomy, and that she was processing everything.  I encouraged Ms. Jasmine Stafford to attend the [employer] Breast Cancer Support Group.  She declined a referral to [facility].  I discussed my observations of her with Marianne Sofia, nurse navigator, and encouraged her to assess Ms. Jasmine Stafford' distress level when she does a follow-up call on Monday.

## 2012-08-21 NOTE — Progress Notes (Signed)
Subjective:   New diagnosis cancer of the right breast  Patient ID: Jasmine Stafford, female   DOB: September 11, 1965, 47 y.o.   MRN: [id number]  HPI Patient is a pleasant 47 year old female referred by Dr. Roswell Nickel at the breast center for a new diagnosis of right breast cancer. The patient has a very strong family history of breast cancer and has been undergoing annual screening mammograms since her late 80s. She recently presented for her routine screening mammogram. I have personally reviewed all her imaging studies. Her mammogram revealed a new area of distortion in the upper-outer quadrant of the right breast and diagnostic studies were recommended. This revealed a 3 cm focus of architectural distortion at the 10:00 position of the right breast posteriorly. Ultrasound revealed an irregular hypoechoic mass at 11 cm from the nipple at the 10:00 position measuring 32 mm at its greatest dimension. Ultrasound-guided core biopsy was recommended and performed. This has revealed invasive lobular carcinoma associated with atypical lobular hyperplasia, grade 1, ER/PR positive and HER-2 negative. Subsequent bilateral breast  MRI was performed which has revealed a 7.2 x 6.6 x 3.7 cm irregular area of mass and non-mass like enhancement in the upper-outer quadrant of the right breast. No additional masses or area of concern or adenopathy were noted. The patient has not noted any change in her breast, specifically lumps, pain, discharge or skin changes. She has no previous history of breast biopsies or breast disease.  She is seen in the multidisciplinary clinic to discuss treatment options. Past Medical History  Diagnosis Date  . Anemia   . HTN (hypertension)   . History of chicken pox   . Genital warts   . Status post blood transfusion without reported diagnosis 1998, 2002, 2005 x2  . Breast cancer    Past Surgical History  Procedure Date  . Myomectomy   . Cervical cone biopsy     cervical tumors   Current  Outpatient Prescriptions  Medication Sig Dispense Refill  . COD LIVER OIL PO Take 530 mg by mouth daily.      Marland Kitchen DANDELION PO Take 520 mg by mouth daily.      Marland Kitchen doxycycline (VIBRAMYCIN) 100 MG capsule Take 1 capsule (100 mg total) by mouth 2 (two) times daily.  20 capsule  0  . ferrous gluconate (FERGON) 325 MG tablet Take 325 mg by mouth daily.        . hydrALAZINE (APRESOLINE) 100 MG tablet Take 100 mg by mouth 2 (two) times daily.       . hydrochlorothiazide (HYDRODIURIL) 25 MG tablet Take 25 mg by mouth daily.      Marland Kitchen labetalol (NORMODYNE) 300 MG tablet Take 300 mg by mouth 2 (two) times daily.        . Misc Natural Products (RED CLOVER COMBINATION PO) Take 375 mg by mouth daily.      . Multiple Vitamins-Iron (CHLORELLA PO) Take 4 g by mouth daily.      . NON FORMULARY 1 each daily. Fibrovan      . Prenat w/o A-FE-DSS-Methfol-FA (PRENATAL MULTIVITAMIN) 90-600-400 MG-MCG-MCG tablet Take 1 tablet by mouth daily.        . Probiotic Product (ALIGN) 4 MG CAPS Take 1 capsule by mouth daily.  30 capsule  0  . promethazine (PHENERGAN) 25 MG suppository Place 1 suppository (25 mg total) rectally every 6 (six) hours as needed for nausea.  6 each  0   Allergies  Allergen Reactions  . Other  Tomatoes & Mushrooms  . Shellfish Allergy   . Erythromycin Hives  . Moxifloxacin Hives  . Penicillins Hives  . Tequin (Gatifloxacin) Hives   History  Substance Use Topics  . Smoking status: Never Smoker   . Smokeless tobacco: Not on file  . Alcohol Use: No   family history includes Arthritis in her other; Brain cancer in her paternal aunt; Breast cancer in her cousin, maternal aunt, mother, and other; Colon cancer in her maternal aunt; Diabetes in her brother and father; Hypertension in her father; Lung cancer in her maternal grandfather and paternal uncle; Ovarian cancer in her mother and other; Prostate cancer in her father; Rectal cancer in her maternal aunt; Stomach cancer in her paternal uncle; and  Uterine cancer in her other.   Review of Systems  Constitutional: Negative for fever, chills and fatigue.  HENT: Negative.   Respiratory: Negative.   Cardiovascular: Negative.   Gastrointestinal: Negative.   Musculoskeletal: Negative.        Objective:   Physical Exam General: Alert, Obese African American female, in no distress Skin: Warm and dry without rash or infection. HEENT: No palpable masses or thyromegaly. Sclera nonicteric. Pupils equal round and reactive. Oropharynx clear. Lymph nodes: No cervical, supraclavicular, or inguinal nodes palpable. Breasts: In the upper outer quadrant of the right breast is a somewhat indistinct firm freely movable mass measuring at least 5 cm in diameter. No skin changes or discharge or nipple inversion. Lungs: Breath sounds clear and equal without increased work of breathing Cardiovascular: Regular rate and rhythm. 26 systolic murmur. No JVD or edema. Peripheral pulses intact. Abdomen: Nondistended. Soft and nontender. No masses palpable. No organomegaly. No palpable hernias. Extremities: No edema or joint swelling or deformity. No chronic venous stasis changes. Neurologic: Alert and fully oriented. Gait normal.     Assessment:     New diagnosis of invasive lobular carcinoma in the upper-outer quadrant of the right breast. This measures 3.2 cm by ultrasound but 7.6 cm by MRI. Clinically T3, N0, M0. I discussed the nature of the diagnosis and treatment options with the patient and her sister and fiance today. We discussed the nature of lobular cancer in that it tends to be infiltrative and often larger than imaging would suggest so that I suspect the size on MR is more likely to be correct. We discussed initial surgical treatment options including lumpectomy with sentinel lymph node biopsy versus total mastectomy with sentinel lymph node biopsy and possible axillary dissection. I would be reluctant to recommend lumpectomy initially due to the size  of the mass clinically and on MR. We discussed the options for neoadjuvant treatment in order to shrink the tumor if she would like to pursue breast conservation. We also discussed her strong family history and genetics or certainly indicated and that the results of her genetic testing could influence her decision regarding mastectomy or not. Based on the size of the tumor and lobular histology I would lean toward total mastectomy although I would not rule out breast conservation if she would strongly like to pursue this.    Plan:     After consultation today with medical and radiation oncology as well as myself the patient has had all her questions answered and she would like to consider her options regarding neoadjuvant treatment in an attempt at breast conservation versus proceeding with total mastectomy and sentinel lymph node biopsy. I would like to see her back in my office prior to surgery after she has made her  decision.

## 2012-08-21 NOTE — Patient Instructions (Addendum)
Genetic testing  Discussed mastectomy vs lumpectomy.  If you choose lumpectomy then we discussed doing chemotherapy first followed by surgery  I will see you back in 6 months

## 2012-08-21 NOTE — Progress Notes (Signed)
Checked in new patient. No financial issues. °

## 2012-08-22 NOTE — Progress Notes (Signed)
Radiation Oncology         [phone] ________________________________  Name: FRANCE LUSTY MRN: [id number]  Date: 08/21/2012  DOB: 1966-03-25  CC:Sonda Primes, MD  Hoxworth, Lorne Skeens, MD   Drue Second, MD  REFERRING PHYSICIAN: Johna Sheriff Lorne Skeens, MD   DIAGNOSIS: The encounter diagnosis was Cancer of upper-outer quadrant of female breast.   HISTORY OF PRESENT ILLNESS::Erionna D Colton is a 47 y.o. female who is seen for an initial consultation visit. The patient has been undergoing annual screening screening mammogram sense she was approximately 41. On recent screening mammography she was found to have a suspicious area within the upper outer quadrant of the right breast. Additional studies were recommended and these confirmed a 3 cm focus of distortion at the 10:00 position. Ultrasound was performed and this showed a 3.2 cm irregular hypoechoic mass which corresponded to this area. An ultrasound guided core biopsy was performed and this returned positive for invasive lobular carcinoma. This was a grade 1 tumor. The tumor was HER-2/neu negative, ER positive, and PR positive. Atypical lobular hyperplasia was also present.  The patient then proceeded to undergo an MRI scan of the breasts bilaterally. This showed a larger area of concern measuring 7.2 x 6.6 x 3.7 cm. This corresponded to an air regular area of nodular and non-mass enhancement within the upper outer right breast. There is no evidence of malignancy elsewhere within the right breast or on the left and no suspicious adenopathy present.  The patient denies any breast symptoms today. No pain or skin changes. She is seen in multidisciplinary breast clinic today for evaluation and discussion of her treatment options.    PREVIOUS RADIATION THERAPY: No   PAST MEDICAL HISTORY:  has a past medical history of Anemia; HTN (hypertension); History of chicken pox; Genital warts; Status post blood transfusion without reported  diagnosis (1998, 2002, 2005 x2); and Breast cancer.     PAST SURGICAL HISTORY: Past Surgical History  Procedure Date  . Myomectomy   . Cervical cone biopsy     cervical tumors     FAMILY HISTORY: family history includes Arthritis in her other; Brain cancer in her paternal aunt; Breast cancer in her cousin, maternal aunt, mother, and other; Colon cancer in her maternal aunt; Diabetes in her brother and father; Hypertension in her father; Lung cancer in her maternal grandfather and paternal uncle; Ovarian cancer in her mother and other; Prostate cancer in her father; Rectal cancer in her maternal aunt; Stomach cancer in her paternal uncle; and Uterine cancer in her other.   SOCIAL HISTORY:  reports that she has never smoked. She does not have any smokeless tobacco history on file. She reports that she does not drink alcohol or use illicit drugs.   ALLERGIES: Other; Shellfish allergy; Erythromycin; Moxifloxacin; Penicillins; and Tequin   MEDICATIONS:  Current Outpatient Prescriptions  Medication Sig Dispense Refill  . COD LIVER OIL PO Take 530 mg by mouth daily.      Marland Kitchen DANDELION PO Take 520 mg by mouth daily.      Marland Kitchen doxycycline (VIBRAMYCIN) 100 MG capsule Take 1 capsule (100 mg total) by mouth 2 (two) times daily.  20 capsule  0  . ferrous gluconate (FERGON) 325 MG tablet Take 325 mg by mouth daily.        . hydrALAZINE (APRESOLINE) 100 MG tablet Take 100 mg by mouth 2 (two) times daily.       . hydrochlorothiazide (HYDRODIURIL) 25 MG tablet Take 25 mg by  mouth daily.      Marland Kitchen labetalol (NORMODYNE) 300 MG tablet Take 300 mg by mouth 2 (two) times daily.        . Misc Natural Products (RED CLOVER COMBINATION PO) Take 375 mg by mouth daily.      . Multiple Vitamins-Iron (CHLORELLA PO) Take 4 g by mouth daily.      . NON FORMULARY 1 each daily. Fibrovan      . Prenat w/o A-FE-DSS-Methfol-FA (PRENATAL MULTIVITAMIN) 90-600-400 MG-MCG-MCG tablet Take 1 tablet by mouth daily.        .  Probiotic Product (ALIGN) 4 MG CAPS Take 1 capsule by mouth daily.  30 capsule  0  . promethazine (PHENERGAN) 25 MG suppository Place 1 suppository (25 mg total) rectally every 6 (six) hours as needed for nausea.  6 each  0     REVIEW OF SYSTEMS:  A 15 point review of systems is documented in the electronic medical record. This was obtained by the nursing staff. However, I reviewed this with the patient to discuss relevant findings and make appropriate changes.  Pertinent items are noted in HPI.    PHYSICAL EXAM:  vitals were not taken for this visit.  General: Well-developed, in no acute distress HEENT: Normocephalic, atraumatic; oral cavity clear Neck: Supple without any lymphadenopathy Cardiovascular: Regular rate and rhythm Respiratory: Clear to auscultation bilaterally GI: Soft, nontender, normal bowel sounds Extremities: No edema present Neuro: No focal deficits Breasts:  An irregular mass is palpable within the upper outer quadrant of the right breast. No skin changes. No axillary adenopathy present.    LABORATORY DATA:  Lab Results  Component Value Date   WBC 5.8 08/21/2012   HGB 9.1* 08/21/2012   HCT 28.6* 08/21/2012   MCV 81.2 08/21/2012   PLT 368 08/21/2012   Lab Results  Component Value Date   NA 140 08/21/2012   K 3.5 08/21/2012   CL 105 08/21/2012   CO2 25 08/21/2012   Lab Results  Component Value Date   ALT 22 08/21/2012   AST 19 08/21/2012   ALKPHOS 64 08/21/2012   BILITOT 0.42 08/21/2012      RADIOGRAPHY: US Breast Right  08/09/2012  *RADIOLOGY REPORT*  Clinical Data:  possible right breast mass  DIGITAL DIAGNOSTIC RIGHT MAMMOGRAM  AND RIGHT BREAST ULTRASOUND:  Comparison:  multiple prior studies including 07/10/12  Findings:  ACR Breast Density Category 3: The breast tissue is heterogeneously dense.  There is a persistent 3cm focus of architectural distoriton in the 10 o'clock position of the right breast posteriorly.  On physical exam, there is subtle firmness in  the upper outer quadrant of the right breast  Ultrasound is performed, showing an irregular hypoechoic mass in the 10 o'clock position of the right breast 11cm from the nipple. It causes posterior acoustic shadowing.  It measures 32 x 20 x 21mm.  Ultrasound of the right axilla is negative.  IMPRESSION: Suspicious right breast mass.  BI-RADS CATEGORY 4:  Suspicious abnormality - biopsy should be considered.  RECOMMENDATION: The patient will undergo ultrasound-guided core needle biopsy this morning.  I have discussed the findings and recommendations with the patient. Results were also provided in writing at the conclusion of the visit.   Original Report Authenticated By: Esperanza Heir, M.D.    Mr Breast Bilateral W Wo Contrast  08/16/2012  *RADIOLOGY REPORT*  Clinical Data: Recently diagnosed right breast invasive lobular carcinoma.  BUN and creatinine were obtained on site at [facility] at [address] Results:  BUN 7 mg/dL,  Creatinine 1.0 mg/dL.  BILATERAL BREAST MRI WITH AND WITHOUT CONTRAST  Technique: Multiplanar, multisequence MR images of both breasts were obtained prior to and following the intravenous administration of 20ml of MultiHance.  Three dimensional images were evaluated at the independent DynaCad workstation.  Comparison:  Previous and current mammogram, ultrasound and biopsy examinations.  Findings: Mild to moderate background parenchymal enhancement in both breasts with a mild nodular pattern.  Multiple small cysts in both breasts.  7.2 x 6.6 x 3.7 cm irregular area of nodular and non mass enhancement in the upper outer right breast.  This contains a biopsy marker clip artifact and corresponds to the recently biopsied invasive lobular carcinoma.  This has a mixture of enhancement kinetics, including a small amount of rapid washin/washout.  No additional masses or areas of enhancement suspicious for malignancy in either breast.  No abnormal appearing lymph nodes.  IMPRESSION:   1.  7.6 x 6.6 x 3.7 cm biopsy-proven invasive lobular carcinoma in the upper outer right breast. 2.  No evidence of malignancy on the left and no adenopathy.  RECOMMENDATION: Treatment plan  THREE-DIMENSIONAL MR IMAGE RENDERING ON INDEPENDENT WORKSTATION:  Three-dimensional MR images were rendered by post-processing of the original MR data on an independent workstation.  The three- dimensional MR images were interpreted, and findings were reported in the accompanying complete MRI report for this study.  BI-RADS CATEGORY 6:  Known biopsy-proven malignancy - appropriate action should be taken.   Original Report Authenticated By: Beckie Salts, M.D.    Mm Digital Diag Ltd R  08/09/2012  *RADIOLOGY REPORT*  Clinical Data:  possible right breast mass  DIGITAL DIAGNOSTIC RIGHT MAMMOGRAM  AND RIGHT BREAST ULTRASOUND:  Comparison:  multiple prior studies including 07/10/12  Findings:  ACR Breast Density Category 3: The breast tissue is heterogeneously dense.  There is a persistent 3cm focus of architectural distoriton in the 10 o'clock position of the right breast posteriorly.  On physical exam, there is subtle firmness in the upper outer quadrant of the right breast  Ultrasound is performed, showing an irregular hypoechoic mass in the 10 o'clock position of the right breast 11cm from the nipple. It causes posterior acoustic shadowing.  It measures 32 x 20 x 21mm.  Ultrasound of the right axilla is negative.  IMPRESSION: Suspicious right breast mass.  BI-RADS CATEGORY 4:  Suspicious abnormality - biopsy should be considered.  RECOMMENDATION: The patient will undergo ultrasound-guided core needle biopsy this morning.  I have discussed the findings and recommendations with the patient. Results were also provided in writing at the conclusion of the visit.   Original Report Authenticated By: Esperanza Heir, M.D.    Mm Digital Diagnostic Unilat R  08/09/2012  *RADIOLOGY REPORT*  Clinical Data:  right breast mass  DIGITAL  DIAGNOSTIC RIGHT MAMMOGRAM  Comparison:  Previous exams.  Findings:  Films are performed following ultrasound guided biopsy of the mass in the 10 o'clock position of the right breast.  Marker clip (ribbon-shaped) projects in the anticipated position on two views.  IMPRESSION: Appropriate clip positioning.   Original Report Authenticated By: Esperanza Heir, M.D.    Mm Radiologist Eval And Mgmt  08/13/2012  *RADIOLOGY REPORT*  ESTABLISHED PATIENT OFFICE VISIT - LEVEL II [phone])  Chief Complaint:  patient returns for review of biopsy results  History:  The patient underwent ultrasound-guided core needle biopsy on 08/09/12 of a mass in the 10 o'clock position of the right breast.  Exam:  A bandaid covers the biopsy site, which is clean and dry. The patient denies any pain, and there is no visible bruising.  Pathology: Pathologic results indicate the presence of invasive lobular carcinoma, which is concordant with the imaging appearance.  Assessment and Plan:  The patient was given an appointment for breast mri on 08/16/12, and an appointment for the [facility] on 08/21/12. I answered all her questions and invited her to contact me at the [facility] if I could be of any further assistance.   Original Report Authenticated By: Esperanza Heir, M.D.    US Breast Bx W Loc Dev 1st Lesion Img Bx Spec US Guide  08/09/2012  *RADIOLOGY REPORT*  Clinical Data:  mass 10:00 right breast 11cm from nipple  ULTRASOUND GUIDED VACUUM ASSISTED CORE BIOPSY OF THE RIGHT BREAST  Comparison: Previous exams.  I met with the patient and we discussed the procedure of ultrasound- guided biopsy, including benefits and alternatives.  We discussed the high likelihood of a successful procedure. We discussed the risks of the procedure including infection, bleeding, tissue injury, clip migration, and inadequate sampling.  Informed written consent was given.  Using sterile technique, 2% lidocaine ultrasound guidance and a 12 gauge vacuum assisted needle biopsy was  performed of the right breast mass using an inferolateral to superomedial  approach.  At the conclusion of the procedure, a  tissue marker clip was deployed into the biopsy cavity.  Follow-up 2-view mammogram was performed and dictated separately.  IMPRESSION: Ultrasound-guided biopsy of right breast mass.  No apparent complications.   Original Report Authenticated By: Esperanza Heir, M.D.        IMPRESSION: The patient is a pleasant 47 year old female with a recent diagnosis of invasive lobular carcinoma of the right breast. A stone the size and extent of this tumor, a right-sided mastectomy has been recommended for her. This also corresponded to an invasive lobular carcinoma which is clinically staged as a T3, N0, M0 tumor. Of note, the extent was greater on MRI scan and an initial workup including ultrasound.   PLAN: The patient therefore is to proceed with a right-sided mastectomy. Dr. Park Breed would like to review her final pathology to determine whether she would recommend chemotherapy versus hormonal treatment alone. Based on the size of the tumor, I would anticipate recommending postmastectomy radiotherapy.   I discussed with the patient an anticipated 6-1/2 week course of radiotherapy. We discussed the rationale of such a treatment in terms of improved local/regional control. We also discussed the potential side effects and risks of treatment. All of her questions were answered.  She does wish to proceed with this overall treatment plan. I therefore look forward to seeing her postoperatively - we will see how she is doing at that time and  can coordinate her course of radiotherapy.   ________________________________   Radene Gunning, MD, PhD

## 2012-08-27 ENCOUNTER — Ambulatory Visit (HOSPITAL_BASED_OUTPATIENT_CLINIC_OR_DEPARTMENT_OTHER): Payer: BC Managed Care – PPO | Admitting: Genetic Counselor

## 2012-08-27 ENCOUNTER — Other Ambulatory Visit: Payer: BC Managed Care – PPO | Admitting: Lab

## 2012-08-27 DIAGNOSIS — Z801 Family history of malignant neoplasm of trachea, bronchus and lung: Secondary | ICD-10-CM

## 2012-08-27 DIAGNOSIS — C50419 Malignant neoplasm of upper-outer quadrant of unspecified female breast: Secondary | ICD-10-CM

## 2012-08-27 DIAGNOSIS — Z803 Family history of malignant neoplasm of breast: Secondary | ICD-10-CM

## 2012-08-27 DIAGNOSIS — IMO0002 Reserved for concepts with insufficient information to code with codable children: Secondary | ICD-10-CM

## 2012-08-28 ENCOUNTER — Encounter: Payer: Self-pay | Admitting: Genetic Counselor

## 2012-08-28 NOTE — Progress Notes (Signed)
Dr.  Drue Second requested a consultation for genetic counseling and risk assessment for Jasmine Stafford, a 47 y.o. female, for discussion of her breast cancer and family history of breast, prostate, colon, brain and stomach cancer. She presents to clinic today to discuss the possibility of a genetic predisposition to cancer, and to further clarify her risks, as well as her family members' risks for cancer.   HISTORY OF PRESENT ILLNESS: In 2014, at the age of 10, Jasmine Stafford was diagnosed with breast cancer.  Per the patient, her treatment plan is still under consideration.    Past Medical History  Diagnosis Date  . Anemia   . HTN (hypertension)   . History of chicken pox   . Genital warts   . Status post blood transfusion without reported diagnosis 1998, 2002, 2005 x2  . Breast cancer     Past Surgical History  Procedure Date  . Myomectomy   . Cervical cone biopsy     cervical tumors    History  Substance Use Topics  . Smoking status: Never Smoker   . Smokeless tobacco: Not on file  . Alcohol Use: No    REPRODUCTIVE HISTORY AND PERSONAL RISK ASSESSMENT FACTORS: Menarche was at age 7.   Premenopausal Uterus Intact: Yes Ovaries Intact: Yes G1P0A1 , first live birth at age N/A  She has not previously undergone treatment for infertility.   OCP use for 15 years   She has not used HRT in the past.    FAMILY HISTORY:  We obtained a detailed, 4-generation family history.  Significant diagnoses are listed below: Family History  Problem Relation Age of Onset  . Colon cancer Other     MGM's maternal half sister  . Breast cancer Mother 82    also diagnosed at 20 and 22  . Diabetes Father   . Hypertension Father   . Prostate cancer Father 41  . Diabetes Brother   . Colon cancer Maternal Aunt   . Breast cancer Maternal Aunt 51  . Rectal cancer Maternal Aunt   . Brain cancer Paternal Aunt     dx in her 60s  . Stomach cancer Paternal Uncle 31  . Lung cancer  Maternal Grandfather 73  . Tuberculosis Maternal Grandfather   . Breast cancer Cousin     paternal cousin dx in her 72s  . Tuberculosis Maternal Grandmother   . Diabetes Paternal Grandmother   . Breast cancer Other 48    Maternal great grandmother  . Lung cancer Paternal Uncle 52  . Cancer Paternal Uncle     cancerous tumor on his back  The patient was diagnosed with breast cancer at age 33.  She has two sisters and three brothers who are cancer freel.  Her mother was diagnosed with breast cancer at ages 89, 75 and 79 and died at 35.  She had a brother and sister.  The sister had breast cancer at age 81.  The patient's maternal grandmother died at 43 from TB, and her grandfather died of TB in his late 67s but also had been diagnosed with lung cancer at 28.  The patient's maternal grandmother had a maternal half sister who was diagnosed with rectal cancer and the patient's maternal great grandmother had breast cancer diagnosed at age 99 and 33.  The patient's father was diagnosed with prostate cancer at age 88.  He had a brother who had stomach cancer at age 36, another brother with lung cancer at 27 and  a third brother who is alive but had a malignant tumor that was removed off his back.  A fourth brother who did not have cancer had a daughter with breast cancer in her 12s.  He had a sister who was diagnosed with brain cancer in her 28s.  He had an additional 9 siblings who were cancer free.  Patient's maternal ancestors are of [place] and [place] descent, and paternal ancestors are of [place] descent. There is no reported Ashkenazi Jewish ancestry. There is known consanguinity - about 15-20 generations back there is a common ancestor.  GENETIC COUNSELING RISK ASSESSMENT, DISCUSSION, AND SUGGESTED FOLLOW UP: We reviewed the natural history and genetic etiology of sporadic, familial and hereditary cancer syndromes. About 5-10% of breast cancer is hereditary.  Of this, about 85% is  the result of a BRCA1 or BRCA2 mutation.  We reviewed the red flags of hereditary cancer syndromes and the dominant inheritance patterns.  If the BRCA testing is negative, we discussed that we could be testing for the wrong gene.  We discussed gene panels, and that several cancer genes that are associated with different cancers can be tested at the same time.  Because of the different types of cancer that are in the patient's family, we would consider one of the panel tests in order to cast a larger net to diagnose a potential cancer syndrome.  The patient voiced her opinion that she would not do anything different based on genetic test results, simply because they would indicate a possibility of something happening, but that it was not a sure thing.  We discussed different scenerios where she would be put at increased risk for different cancers and could get increased screening or surgery to reduce her risk and she continually stated that she would not do anything differently.   The patient's personal history of breast cancer and family history of breast, prostate, stomach and rectal cancer is suggestive of the following possible diagnosis: hereditary cancer syndrome  We discussed that identification of a hereditary cancer syndrome may help her care providers tailor the patients medical management. If a mutation indicating a hereditary cancer syndrome is detected in this case, the [employer] recommendations would include increased cancer surveillance and possible prophylactic surgery. If a mutation is detected, the patient will be referred back to the referring provider and to any additional appropriate care providers to discuss the relevant options.   If a mutation is not found in the patient, this will decrease the likelihood of a hereditary cancer syndrome as the explanation for her breast cancer. Cancer surveillance options would be discussed for the patient according to the  appropriate standard National Comprehensive Cancer Network and American Cancer Society guidelines, with consideration of their personal and family history risk factors. In this case, the patient will be referred back to their care providers for discussions of management.   In order to estimate her chance of having a BRCA mutation, we used statistical models (Penn II) and laboratory data that take into account her personal medical history, family history and ancestry.  Because each model is different, there can be a lot of variability in the risks they give.  Therefore, these numbers must be considered a rough range and not a precise risk of having a BRCA mutation.  This model estimate that she has approximately a 16% chance of having a mutation. Based on this assessment of her family and personal history, genetic testing is recommended.  After considering the  risks, benefits, and limitations, the patient declined testing stating that she would not change anything that she does, including screening or risk reducing surgery, based on her results.  The patient was seen for a total of 60 minutes, greater than 50% of which was spent face-to-face counseling.  This plan is being carried out per Dr. Feliz Beam recommendations.  This note will also be sent to the referring provider via the electronic medical record. The patient will be supplied with a summary of this genetic counseling discussion as well as educational information on the discussed hereditary cancer syndromes following the conclusion of their visit.   Patient was discussed with Dr. Drue Second.  _______________________________________________________________________ For Office Staff:  Number of people involved in session: 3 Was an Intern/ student involved with case: no

## 2012-08-30 ENCOUNTER — Telehealth: Payer: Self-pay | Admitting: *Deleted

## 2012-08-30 NOTE — Telephone Encounter (Signed)
Spoke to pt concerning [facility].  Pt denies questions or concerns regarding dx or treatment care plan.  Pt decided not to have genetic testing performed.  She is weighing her options on surgery whether lumpectomy or mastectomy.  She might decided to have a second opinion.  She will let me know what she decides.  Encourage pt to call with needs.  Received verbal understanding.  Contact information given.

## 2012-09-06 ENCOUNTER — Encounter: Payer: Self-pay | Admitting: Specialist

## 2012-09-06 NOTE — Progress Notes (Signed)
I talked with Ms. Slevin by phone as a follow-up conversation to meeting her several weeks ago in breast clinic.  She indicated that she is doing research about her options, most likely will seek a second opinion, has a strong faith, is inclined toward naturopathic remedies.  I again encouraged her to talk with other breast cancer patients/survivors at our breast cancer support groups and also to consider a volunteer from [facility].  She said that her mother died with breast cancer in December 29, 2008 and that an aunt also had cancer, and she knows the breast cancer experience very well.  She is engaged to be married and is moving up the date for her marriage.  I assured her that everyone at [facility] is very concerned about her health and her treatment and that if anyone here can be helpful in any way in considering treatment options to call.

## 2012-09-07 ENCOUNTER — Encounter: Payer: Self-pay | Admitting: *Deleted

## 2012-09-07 NOTE — Progress Notes (Signed)
Mailed after appt letter to pt. 

## 2012-09-24 ENCOUNTER — Telehealth: Payer: Self-pay | Admitting: *Deleted

## 2012-09-24 NOTE — Telephone Encounter (Signed)
Spoke to pt to see if she has decided on type of treatment she would like to proceed with.  Pt relay she is currently doing alternative therapy and natural herbs.  Pt relayed she has been doing research and following some natural healing treatments she has found.  Pt relay she would like to avoid chemotherapy and radiation. Pt is getting married soon and would like to have children.  She agreed to have a f/u appt with Dr. Welton Flakes.  Confirmed appt date and time for 10/08/12 at 1:30pm.  Pt denies further needs at this time.

## 2012-10-08 ENCOUNTER — Encounter: Payer: Self-pay | Admitting: Oncology

## 2012-10-08 ENCOUNTER — Ambulatory Visit (HOSPITAL_BASED_OUTPATIENT_CLINIC_OR_DEPARTMENT_OTHER): Payer: BC Managed Care – PPO | Admitting: Oncology

## 2012-10-08 VITALS — BP 161/77 | HR 77 | Temp 98.8°F | Resp 20 | Ht 63.5 in | Wt 265.2 lb

## 2012-10-08 DIAGNOSIS — C50419 Malignant neoplasm of upper-outer quadrant of unspecified female breast: Secondary | ICD-10-CM

## 2012-10-08 DIAGNOSIS — C50411 Malignant neoplasm of upper-outer quadrant of right female breast: Secondary | ICD-10-CM

## 2012-10-08 DIAGNOSIS — Z170 Estrogen receptor positive status [ER+]: Secondary | ICD-10-CM

## 2012-10-08 NOTE — Progress Notes (Signed)
OFFICE PROGRESS NOTE  CC  Sonda Primes, MD [address] [facility] [address] [place] [address] Dr. Dorothy Puffer  Dr. Glenna Fellows  DIAGNOSIS: 47 year old female with new diagnosis of stage II invasive lobular carcinoma that is ER positive PR positive HER-2/neu negative    STAGE:  Cancer of upper-outer quadrant of female breast  Primary site: Breast (Right)  Staging method: AJCC 7th Edition  Clinical: Stage IIB (T3, N0, cM0)  Summary: Stage IIB (T3, N0, cM0)   PRIOR THERAPY:  #1underwent a screening mammogram and was found to have a suspicious area within the upper-outer quadrant of the right breast. Additional studies confirmed a 3 cm focus of distortion at the 10:00 position. Ultrasound showed a 3.2 cm regular hypoechoic mass. Ultrasound guided core biopsy showed invasive lobular carcinoma grade 1 tumor was ER positive PR positive HER-2/neu negative. There was also atypical lobular hyperplasia noted. She underwent MRI of the breasts that showed large area of concern measures 7.2 x 6.6 x 3.7 cm. No evidence of any suspicious adenopathy.  CURRENT THERAPY:patient has not had any kind of therapy up to date per her own decision  INTERVAL HISTORY: Jasmine Stafford 47 y.o. female returns for followup visit today. She tells me that she has not had her surgery as of yet. She is uncertain what to do. She is apparently following holistic and complementary medications. She is taking herbs. She is even thinking about going for a second opinion. She may look at on to [facility] [place]. I've encouraged her to do so. Have also encouraged her to make a decision as soon as possible her father will did accompany her today. She otherwise is without any complaints.  MEDICAL HISTORY: Past Medical History  Diagnosis Date  . Anemia   . HTN (hypertension)   . History of chicken pox   . Genital warts   . Status post blood transfusion without reported diagnosis 1998, 2002, 2005 x2   . Breast cancer     ALLERGIES:  is allergic to other; shellfish allergy; erythromycin; moxifloxacin; penicillins; and tequin.  MEDICATIONS:  Current Outpatient Prescriptions  Medication Sig Dispense Refill  . DANDELION PO Take 520 mg by mouth daily.      . hydrALAZINE (APRESOLINE) 100 MG tablet Take 100 mg by mouth 2 (two) times daily.       . hydrochlorothiazide (HYDRODIURIL) 25 MG tablet Take 25 mg by mouth daily.      Marland Kitchen labetalol (NORMODYNE) 300 MG tablet Take 300 mg by mouth 2 (two) times daily.        . Multiple Vitamins-Iron (CHLORELLA PO) Take 4 g by mouth daily.      . NON FORMULARY 1 each daily. Fibrovan      . Prenat w/o A-FE-DSS-Methfol-FA (PRENATAL MULTIVITAMIN) 90-600-400 MG-MCG-MCG tablet Take 1 tablet by mouth daily.        . COD LIVER OIL PO Take 530 mg by mouth daily.      . ferrous gluconate (FERGON) 325 MG tablet Take 325 mg by mouth daily.        . Misc Natural Products (RED CLOVER COMBINATION PO) Take 375 mg by mouth daily.       No current facility-administered medications for this visit.    SURGICAL HISTORY:  Past Surgical History  Procedure Laterality Date  . Myomectomy    . Cervical cone biopsy      cervical tumors    REVIEW OF SYSTEMS:  Pertinent items are  noted in HPI.   HEALTH MAINTENANCE:   PHYSICAL EXAMINATION: Blood pressure 161/77, pulse 77, temperature 98.8 F (37.1 C), temperature source Oral, resp. rate 20, height 5' 3.5" (1.613 m), weight 265 lb 3.2 oz (120.294 kg). Body mass index is 46.24 kg/(m^2). ECOG PERFORMANCE STATUS: 0 - Asymptomatic  Exam was not performed today   LABORATORY DATA: Lab Results  Component Value Date   WBC 5.8 08/21/2012   HGB 9.1* 08/21/2012   HCT 28.6* 08/21/2012   MCV 81.2 08/21/2012   PLT 368 08/21/2012      Chemistry      Component Value Date/Time   NA 140 08/21/2012 1225   NA 134* 04/03/2012 1600   K 3.5 08/21/2012 1225   K 3.5 04/03/2012 1600   CL 105 08/21/2012 1225   CL 105 04/03/2012 1600   CO2 25  08/21/2012 1225   CO2 25 04/03/2012 1600   BUN 9.0 08/21/2012 1225   BUN 18 04/03/2012 1600   CREATININE 1.0 08/21/2012 1225   CREATININE 1.0 04/03/2012 1600      Component Value Date/Time   CALCIUM 9.5 08/21/2012 1225   CALCIUM 8.3* 04/03/2012 1600   ALKPHOS 64 08/21/2012 1225   ALKPHOS 78 03/29/2012 0403   AST 19 08/21/2012 1225   AST 21 03/29/2012 0403   ALT 22 08/21/2012 1225   ALT 18 03/29/2012 0403   BILITOT 0.42 08/21/2012 1225   BILITOT 0.5 03/29/2012 0403       RADIOGRAPHIC STUDIES:  No results found.  ASSESSMENT: 47 year old female with new diagnosis of invasive lobular carcinoma. Patient was diagnosed back in January 2014. Up-to-date she has not made a final decision as to what type of therapy she would like. She is exploring her options at this time including complementary and holistic natural medicines. She is also thinking about going to [facility] [place].   PLAN:   #1 patient will let me know what her final decision is. She is taking control of her on care. I have encouraged her to keep in touch with me should she change her mind in regards to taking antiestrogen therapy and having her surgery performed.   All questions were answered. The patient knows to call the clinic with any problems, questions or concerns. We can certainly see the patient much sooner if necessary.  I spent 15 minutes counseling the patient face to face. The total time spent in the appointment was 30 minutes.    Drue Second, MD Medical/Oncology [facility] [phone] (beeper) [phone] (Office)  10/08/2012, 2:55 PM

## 2012-11-06 ENCOUNTER — Telehealth: Payer: Self-pay | Admitting: Radiation Oncology

## 2012-11-06 NOTE — Telephone Encounter (Signed)
Faxed breast clinic note 08/22/12 to [facility], [phone].  OK per SES.  Received confirmation.

## 2013-06-17 ENCOUNTER — Ambulatory Visit (HOSPITAL_BASED_OUTPATIENT_CLINIC_OR_DEPARTMENT_OTHER): Payer: BC Managed Care – PPO | Attending: Otolaryngology | Admitting: Radiology

## 2013-06-17 VITALS — Ht 64.0 in | Wt 261.0 lb

## 2013-06-17 DIAGNOSIS — G4733 Obstructive sleep apnea (adult) (pediatric): Secondary | ICD-10-CM | POA: Insufficient documentation

## 2013-06-22 DIAGNOSIS — G4733 Obstructive sleep apnea (adult) (pediatric): Secondary | ICD-10-CM

## 2013-06-22 NOTE — Procedures (Signed)
NAME:  Jasmine Stafford, Jasmine Stafford              ACCOUNT NO.:  [id number]  MEDICAL RECORD NO.:  [id number]          PATIENT TYPE:  OUT  LOCATION:  SLEEP CENTER                 FACILITY:  [facility]  PHYSICIAN:  Clinton D. Maple Hudson, MD, FCCP, FACPDATE OF BIRTH:  May 16, 1967  DATE OF STUDY:  06/17/2013                           NOCTURNAL POLYSOMNOGRAM  REFERRING PHYSICIAN:  Cristal Deer E. Ezzard Standing, M.D.  REFERRING PHYSICIAN:  Kristine Garbe. Ezzard Standing, MD  INDICATION FOR STUDY:  Hypersomnia with sleep apnea.  EPWORTH SLEEPINESS SCORE:  13/24.  BMI 44.8, weight 261 pounds, height 64 inches, neck 15.5 inches.  MEDICATIONS:  Home medications were charted for review.  SLEEP ARCHITECTURE:  Split study protocol.  During the diagnostic phase, total sleep time 187 minutes with sleep efficiency 91.9%.  Stage I was 4%, stage II 47.6%, stage III 39.6%, REM 8.8% of total sleep time. Sleep latency 6.5 minutes, REM latency 87.5 minutes.  Awake after sleep onset 10 minutes.  Arousal index 11.2.  Bedtime medication:  None.  RESPIRATORY DATA:  Split study protocol.  Apnea/hypopnea index (AHI) 16 per hour.  A total of 50 events was scored including 7 obstructive apneas and 43 hypopneas.  Events were more common while supine.  REM AHI 58.2 per hour.  CPAP was titrated to 14 CWP, AHI 0 per hour.  She wore a medium ResMed AirFit F10 full-face mask with heated humidifier and an EPR of 1.  OXYGEN DATA:  Before CPAP, snoring was moderate to loud with oxygen desaturation to a nadir of 83% on room air.  With CPAP control, snoring was prevented and mean oxygen saturation held 94.9% on room air.  CARDIAC DATA:  Sinus rhythm with occasional PAC.  MOVEMENT/PARASOMNIA:  No significant movement disturbance.  Bathroom x1.  IMPRESSION/RECOMMENDATION: 1. Moderate obstructive sleep apnea/hypopnea syndrome, AHI 16 per hour     with events in all positions.  Moderate to loud snoring with oxygen     desaturation to a nadir of 83% on room  air 2. Successful CPAP titration to 14 CWP, AHI 0 per hour.  Snoring was     prevented and mean oxygen saturation     held 94.9% on room air with CPAP.  She wore a medium ResMed AirFit     F10 full-face mask with heated humidifier at an EPR of 1.     Clinton D. Maple Hudson, MD, [facility], FACP Diplomate, American Board of Sleep Medicine    CDY/MEDQ  D:  06/22/2013 11:14:38  T:  06/22/2013 11:40:50  Job:  [id number]

## 2014-07-28 ENCOUNTER — Emergency Department (HOSPITAL_COMMUNITY)
Admission: EM | Admit: 2014-07-28 | Discharge: 2014-07-29 | Disposition: A | Discharge status: Home / Self Care | Payer: BC Managed Care – PPO | Attending: Emergency Medicine | Admitting: Emergency Medicine

## 2014-07-28 ENCOUNTER — Emergency Department (HOSPITAL_COMMUNITY): Payer: BC Managed Care – PPO

## 2014-07-28 ENCOUNTER — Encounter (HOSPITAL_COMMUNITY): Payer: Self-pay | Admitting: Emergency Medicine

## 2014-07-28 DIAGNOSIS — R0602 Shortness of breath: Secondary | ICD-10-CM | POA: Insufficient documentation

## 2014-07-28 DIAGNOSIS — Z8619 Personal history of other infectious and parasitic diseases: Secondary | ICD-10-CM | POA: Diagnosis not present

## 2014-07-28 DIAGNOSIS — D649 Anemia, unspecified: Secondary | ICD-10-CM | POA: Insufficient documentation

## 2014-07-28 DIAGNOSIS — I10 Essential (primary) hypertension: Secondary | ICD-10-CM | POA: Insufficient documentation

## 2014-07-28 DIAGNOSIS — R109 Unspecified abdominal pain: Secondary | ICD-10-CM | POA: Diagnosis present

## 2014-07-28 DIAGNOSIS — R300 Dysuria: Secondary | ICD-10-CM | POA: Insufficient documentation

## 2014-07-28 DIAGNOSIS — Z79899 Other long term (current) drug therapy: Secondary | ICD-10-CM | POA: Diagnosis not present

## 2014-07-28 DIAGNOSIS — Z880 Allergy status to penicillin: Secondary | ICD-10-CM | POA: Diagnosis not present

## 2014-07-28 DIAGNOSIS — Z853 Personal history of malignant neoplasm of breast: Secondary | ICD-10-CM | POA: Diagnosis not present

## 2014-07-28 LAB — COMPREHENSIVE METABOLIC PANEL
ALK PHOS: 108 U/L (ref 39–117)
ALT: 36 U/L — ABNORMAL HIGH (ref 0–35)
ANION GAP: 9 (ref 5–15)
AST: 34 U/L (ref 0–37)
Albumin: 4.1 g/dL (ref 3.5–5.2)
BILIRUBIN TOTAL: 0.5 mg/dL (ref 0.3–1.2)
BUN: 17 mg/dL (ref 6–23)
CO2: 26 mmol/L (ref 19–32)
CREATININE: 1.04 mg/dL (ref 0.50–1.10)
Calcium: 10 mg/dL (ref 8.4–10.5)
Chloride: 103 mEq/L (ref 96–112)
GFR calc non Af Amer: 63 mL/min — ABNORMAL LOW (ref >90)
GFR, EST AFRICAN AMERICAN: 73 mL/min — AB (ref >90)
GLUCOSE: 101 mg/dL — AB (ref 70–99)
POTASSIUM: 3.3 mmol/L — AB (ref 3.5–5.1)
Sodium: 138 mmol/L (ref 135–145)
TOTAL PROTEIN: 8 g/dL (ref 6.0–8.3)

## 2014-07-28 LAB — CBC WITH DIFFERENTIAL/PLATELET
BASOS ABS: 0 10*3/uL (ref 0.0–0.1)
BASOS PCT: 0 % (ref 0–1)
Eosinophils Absolute: 0.1 10*3/uL (ref 0.0–0.7)
Eosinophils Relative: 1 % (ref 0–5)
HCT: 35.7 % — ABNORMAL LOW (ref 36.0–46.0)
HEMOGLOBIN: 12.2 g/dL (ref 12.0–15.0)
Lymphocytes Relative: 32 % (ref 12–46)
Lymphs Abs: 2.5 10*3/uL (ref 0.7–4.0)
MCH: 31 pg (ref 26.0–34.0)
MCHC: 34.2 g/dL (ref 30.0–36.0)
MCV: 90.6 fL (ref 78.0–100.0)
Monocytes Absolute: 0.4 10*3/uL (ref 0.1–1.0)
Monocytes Relative: 5 % (ref 3–12)
NEUTROS ABS: 4.9 10*3/uL (ref 1.7–7.7)
NEUTROS PCT: 62 % (ref 43–77)
Platelets: 313 10*3/uL (ref 150–400)
RBC: 3.94 MIL/uL (ref 3.87–5.11)
RDW: 12.7 % (ref 11.5–15.5)
WBC: 7.9 10*3/uL (ref 4.0–10.5)

## 2014-07-28 LAB — URINALYSIS, ROUTINE W REFLEX MICROSCOPIC
Bilirubin Urine: NEGATIVE
Glucose, UA: NEGATIVE mg/dL
Hgb urine dipstick: NEGATIVE
Ketones, ur: NEGATIVE mg/dL
LEUKOCYTES UA: NEGATIVE
NITRITE: NEGATIVE
Protein, ur: NEGATIVE mg/dL
SPECIFIC GRAVITY, URINE: 1.017 (ref 1.005–1.030)
UROBILINOGEN UA: 0.2 mg/dL (ref 0.0–1.0)
pH: 8 (ref 5.0–8.0)

## 2014-07-28 NOTE — ED Provider Notes (Signed)
CSN: [id number]     Arrival date & time 07/28/14  1834 History  This chart was scribe for Jasmine Speak, MD by Jasmine Stafford, ED Scribe. The patient was seen in room D30C/D30C and the patient's care was started at 11:27 PM.    Chief Complaint  Patient presents with  . Abdominal Pain   The history is provided by the patient. No language interpreter was used.   HPI Comments: Jasmine Stafford is a 48 y.o. female who presents to the Emergency Department complaining of a gradually worsening "pressure" pain in the lower abdomen onset yesterday. She reports that she had a severe bladder infection and is currently on Macrobid. She reports associated frequent urination, SOB and pain while bending over after urination that lasted for about 20 minutes. She denies diarrhea. She states that the pain has eased up since being in the ED. She denies a hx of kidney stones.   Past Medical History  Diagnosis Date  . Anemia   . HTN (hypertension)   . History of chicken pox   . Genital warts   . Status post blood transfusion without reported diagnosis 1998, 2002, 2005 x2  . Breast cancer    Past Surgical History  Procedure Laterality Date  . Myomectomy    . Cervical cone biopsy      cervical tumors   Family History  Problem Relation Age of Onset  . Colon cancer Other     MGM's maternal half sister  . Breast cancer Mother 76    also diagnosed at 85 and 15  . Diabetes Father   . Hypertension Father   . Prostate cancer Father 76  . Diabetes Brother   . Colon cancer Maternal Aunt   . Breast cancer Maternal Aunt 51  . Rectal cancer Maternal Aunt   . Brain cancer Paternal Aunt     dx in her 39s  . Stomach cancer Paternal Uncle 17  . Lung cancer Maternal Grandfather 37  . Tuberculosis Maternal Grandfather   . Breast cancer Cousin     paternal cousin dx in her 71s  . Tuberculosis Maternal Grandmother   . Diabetes Paternal Grandmother   . Breast cancer Other 76    Maternal great grandmother  .  Lung cancer Paternal Uncle 71  . Cancer Paternal Uncle     cancerous tumor on his back   History  Substance Use Topics  . Smoking status: Never Smoker   . Smokeless tobacco: Not on file  . Alcohol Use: No   OB History    No data available     Review of Systems  Constitutional: Negative for fever.  Respiratory: Positive for shortness of breath.   Gastrointestinal: Positive for abdominal pain. Negative for nausea, vomiting and diarrhea.  Genitourinary: Positive for frequency.  All other systems reviewed and are negative.     Allergies  Other; Shellfish allergy; Erythromycin; Moxifloxacin; Penicillins; and Tequin  Home Medications   Prior to Admission medications   Medication Sig Start Date End Date Taking? Authorizing Provider  anastrozole (ARIMIDEX) 1 MG tablet Take 1 mg by mouth daily.   Yes Historical Provider, MD  Ascorbic Acid (VITAMIN C) 1000 MG tablet Take 4,000 mg by mouth daily.   Yes Historical Provider, MD  BIOTIN PO Take 1 tablet by mouth daily.   Yes Historical Provider, MD  Calcium Carb-Cholecalciferol (CALCIUM 600 + D) 600-200 MG-UNIT TABS Take 1 tablet by mouth daily.   Yes Historical Provider, MD  Cholecalciferol (VITAMIN D3)  10000 UNITS capsule Take 10,000 Units by mouth daily.   Yes Historical Provider, MD  Coenzyme Q10 (COQ10 PO) Take 20 mg by mouth daily.   Yes Historical Provider, MD  DANDELION PO Take 520 mg by mouth daily.   Yes Historical Provider, MD  Digestive Enzymes CAPS Take 2 capsules by mouth 3 (three) times daily with meals.   Yes Historical Provider, MD  ferrous gluconate (FERGON) 325 MG tablet Take 325 mg by mouth daily.     Yes Historical Provider, MD  GARLIC PO Take 3 capsules by mouth daily.   Yes Historical Provider, MD  Goserelin Acetate (ZOLADEX [place]) Inject 1 Applicatorful into the skin every 3 (three) months.   Yes Historical Provider, MD  hydrALAZINE (APRESOLINE) 100 MG tablet Take 100 mg by mouth 2 (two) times daily.    Yes Historical  Provider, MD  hydrochlorothiazide (HYDRODIURIL) 25 MG tablet Take 25 mg by mouth daily.   Yes Historical Provider, MD  labetalol (NORMODYNE) 300 MG tablet Take 300 mg by mouth 2 (two) times daily.     Yes Historical Provider, MD  Lactobacillus (PROBIOTIC ACIDOPHILUS PO) Take 2 tablets by mouth daily.   Yes Historical Provider, MD  Melatonin 10 MG TABS Take 20 mg by mouth at bedtime.   Yes Historical Provider, MD  Multiple Vitamins-Iron (CHLORELLA PO) Take 4 g by mouth daily.   Yes Historical Provider, MD  nitrofurantoin, macrocrystal-monohydrate, (MACROBID) 100 MG capsule Take 100 mg by mouth 2 (two) times daily.   Yes Historical Provider, MD  OVER THE COUNTER MEDICATION Take 2 tablets by mouth daily. dimsupreme   Yes Historical Provider, MD  OVER THE COUNTER MEDICATION Take 10 mLs by mouth every 4 (four) hours as needed (cough). tukol   Yes Historical Provider, MD  Phenylephrine-APAP-Guaifenesin (MUCINEX SINUS-MAX CONGESTION PO) Take 2 tablets by mouth every 4 (four) hours as needed (cold).   Yes Historical Provider, MD  Prenat w/o A-FE-DSS-Methfol-FA (PRENATAL MULTIVITAMIN) 90-600-400 MG-MCG-MCG tablet Take 1 tablet by mouth daily.     Yes Historical Provider, MD  Turmeric POWD Take 15 mLs by mouth 3 (three) times daily.   Yes Historical Provider, MD  COD LIVER OIL PO Take 530 mg by mouth daily.    Historical Provider, MD  Misc Natural Products (RED CLOVER COMBINATION PO) Take 375 mg by mouth daily.    Historical Provider, MD  NON FORMULARY 1 each daily. Ashley    Historical Provider, MD   BP 183/88 mmHg  Pulse 56  Temp(Src) 98.1 F (36.7 C) (Oral)  Resp 16  SpO2 99% Physical Exam  Constitutional: She is oriented to person, place, and time. She appears well-developed and well-nourished. No distress.  HENT:  Head: Normocephalic and atraumatic.  Eyes: Conjunctivae and EOM are normal.  Neck: Neck supple. No tracheal deviation present.  Cardiovascular: Normal rate.   Pulmonary/Chest:  Effort normal. No respiratory distress.  Abdominal: There is tenderness. There is no rebound and no guarding.  Tenderness to suprapubic region  Musculoskeletal: Normal range of motion.  Neurological: She is alert and oriented to person, place, and time.  Skin: Skin is warm and dry.  Psychiatric: She has a normal mood and affect. Her behavior is normal.  Nursing note and vitals reviewed.   ED Course  Procedures (including critical care time) DIAGNOSTIC STUDIES: Oxygen Saturation is 99% on RA, normal by my interpretation.    COORDINATION OF CARE: 11:35 PM- Pt advised of plan for treatment and pt agrees.    Labs Review  Labs Reviewed  COMPREHENSIVE METABOLIC PANEL - Abnormal; Notable for the following:    Potassium 3.3 (*)    Glucose, Bld 101 (*)    ALT 36 (*)    GFR calc non Af Amer 63 (*)    GFR calc Af Amer 73 (*)    All other components within normal limits  CBC WITH DIFFERENTIAL - Abnormal; Notable for the following:    HCT 35.7 (*)    All other components within normal limits  URINALYSIS, ROUTINE W REFLEX MICROSCOPIC - Abnormal; Notable for the following:    APPearance HAZY (*)    All other components within normal limits    Imaging Review No results found.   EKG Interpretation None      MDM   Final diagnoses:  None    Patient presents with complaints of suprapubic discomfort after urinating. She is currently on antibiotics for urinary tract infection. This episode this evening occurred after holding her urine while riding in the car. I suspect she had some sort of bladder spasm. She is now feeling much better and workup does not reveal any acute process. She will be discharged to home with Pyridium and when necessary return.  I personally performed the services described in this documentation, which was scribed in my presence. The recorded information has been reviewed and is accurate.     Jasmine Speak, MD 07/29/14 [phone]

## 2014-07-28 NOTE — ED Notes (Signed)
Pt sts lower abd pain after urinating today; pt sts hx of UTI

## 2014-07-28 NOTE — ED Notes (Signed)
Pt states that she experienced urgency with urination and states that she hasn't had control over her bladder this evening. Pt states she experienced "excruciating pain in her bladder" after urinating. Pt reports pain has decreased since arriving to the ED.

## 2014-07-29 MED ORDER — PHENAZOPYRIDINE HCL 200 MG PO TABS: 200.0000 mg | ORAL_TABLET | Freq: Three times a day (TID) | ORAL | Status: DC | PRN

## 2014-07-29 NOTE — Discharge Instructions (Signed)
Pyridium as prescribed as needed for dysuria.   Dysuria Dysuria is the medical term for pain with urination. There are many causes for dysuria, but urinary tract infection is the most common. If a urinalysis was performed it can show that there is a urinary tract infection. A urine culture confirms that you or your child is sick. You will need to follow up with a healthcare provider because:  If a urine culture was done you will need to know the culture results and treatment recommendations.  If the urine culture was positive, you or your child will need to be put on antibiotics or know if the antibiotics prescribed are the right antibiotics for your urinary tract infection.  If the urine culture is negative (no urinary tract infection), then other causes may need to be explored or antibiotics need to be stopped. Today laboratory work may have been done and there does not seem to be an infection. If cultures were done they will take at least 24 to 48 hours to be completed. Today x-rays may have been taken and they read as normal. No cause can be found for the problems. The x-rays may be re-read by a radiologist and you will be contacted if additional findings are made. You or your child may have been put on medications to help with this problem until you can see your primary caregiver. If the problems get better, see your primary caregiver if the problems return. If you were given antibiotics (medications which kill germs), take all of the mediations as directed for the full course of treatment.  If laboratory work was done, you need to find the results. Leave a telephone number where you can be reached. If this is not possible, make sure you find out how you are to get test results. HOME CARE INSTRUCTIONS   Drink lots of fluids. For adults, drink eight, 8 ounce glasses of clear juice or water a day. For children, replace fluids as suggested by your caregiver.  Empty the bladder often. Avoid  holding urine for long periods of time.  After a bowel movement, women should cleanse front to back, using each tissue only once.  Empty your bladder before and after sexual intercourse.  Take all the medicine given to you until it is gone. You may feel better in a few days, but TAKE ALL MEDICINE.  Avoid caffeine, tea, alcohol and carbonated beverages, because they tend to irritate the bladder.  In men, alcohol may irritate the prostate.  Only take over-the-counter or prescription medicines for pain, discomfort, or fever as directed by your caregiver.  If your caregiver has given you a follow-up appointment, it is very important to keep that appointment. Not keeping the appointment could result in a chronic or permanent injury, pain, and disability. If there is any problem keeping the appointment, you must call back to this facility for assistance. SEEK IMMEDIATE MEDICAL CARE IF:   Back pain develops.  A fever develops.  There is nausea (feeling sick to your stomach) or vomiting (throwing up).  Problems are no better with medications or are getting worse. MAKE SURE YOU:   Understand these instructions.  Will watch your condition.  Will get help right away if you are not doing well or get worse. Document Released: 04/14/2004 Document Revised: 10/09/2011 Document Reviewed: 02/20/2008 [facility] Patient Information 2015 [place], [place]. This information is not intended to replace advice given to you by your health care provider. Make sure you discuss any questions you have  with your health care provider. ° °

## 2016-02-10 ENCOUNTER — Encounter (INDEPENDENT_AMBULATORY_CARE_PROVIDER_SITE_OTHER): Payer: BC Managed Care – PPO | Admitting: Ophthalmology

## 2016-02-10 DIAGNOSIS — H35033 Hypertensive retinopathy, bilateral: Secondary | ICD-10-CM | POA: Diagnosis not present

## 2016-02-10 DIAGNOSIS — H43813 Vitreous degeneration, bilateral: Secondary | ICD-10-CM

## 2016-02-10 DIAGNOSIS — I10 Essential (primary) hypertension: Secondary | ICD-10-CM

## 2016-02-10 DIAGNOSIS — H2511 Age-related nuclear cataract, right eye: Secondary | ICD-10-CM | POA: Diagnosis not present

## 2017-01-14 ENCOUNTER — Emergency Department (HOSPITAL_COMMUNITY)
Admission: EM | Admit: 2017-01-14 | Discharge: 2017-01-14 | Disposition: A | Discharge status: Home / Self Care | Payer: BC Managed Care – PPO | Attending: Emergency Medicine | Admitting: Emergency Medicine

## 2017-01-14 ENCOUNTER — Emergency Department (HOSPITAL_COMMUNITY): Payer: BC Managed Care – PPO

## 2017-01-14 ENCOUNTER — Encounter (HOSPITAL_COMMUNITY): Payer: Self-pay | Admitting: Emergency Medicine

## 2017-01-14 DIAGNOSIS — I10 Essential (primary) hypertension: Secondary | ICD-10-CM | POA: Insufficient documentation

## 2017-01-14 DIAGNOSIS — D649 Anemia, unspecified: Secondary | ICD-10-CM

## 2017-01-14 DIAGNOSIS — J189 Pneumonia, unspecified organism: Secondary | ICD-10-CM | POA: Insufficient documentation

## 2017-01-14 DIAGNOSIS — R05 Cough: Secondary | ICD-10-CM | POA: Diagnosis present

## 2017-01-14 DIAGNOSIS — Z79899 Other long term (current) drug therapy: Secondary | ICD-10-CM | POA: Diagnosis not present

## 2017-01-14 LAB — COMPREHENSIVE METABOLIC PANEL
ALT: 24 U/L (ref 14–54)
AST: 26 U/L (ref 15–41)
Albumin: 3.2 g/dL — ABNORMAL LOW (ref 3.5–5.0)
Alkaline Phosphatase: 87 U/L (ref 38–126)
Anion gap: 9 (ref 5–15)
BILIRUBIN TOTAL: 0.7 mg/dL (ref 0.3–1.2)
BUN: 7 mg/dL (ref 6–20)
CHLORIDE: 102 mmol/L (ref 101–111)
CO2: 27 mmol/L (ref 22–32)
Calcium: 8.6 mg/dL — ABNORMAL LOW (ref 8.9–10.3)
Creatinine, Ser: 1.06 mg/dL — ABNORMAL HIGH (ref 0.44–1.00)
GFR calc non Af Amer: >60 mL/min (ref >60)
Glucose, Bld: 115 mg/dL — ABNORMAL HIGH (ref 65–99)
Potassium: 3.3 mmol/L — ABNORMAL LOW (ref 3.5–5.1)
Sodium: 138 mmol/L (ref 135–145)
Total Protein: 8.1 g/dL (ref 6.5–8.1)

## 2017-01-14 LAB — RAPID STREP SCREEN (MED CTR MEBANE ONLY): STREPTOCOCCUS, GROUP A SCREEN (DIRECT): NEGATIVE

## 2017-01-14 LAB — CBC WITH DIFFERENTIAL/PLATELET
Basophils Absolute: 0.1 10*3/uL (ref 0.0–0.1)
Basophils Relative: 0 %
EOS ABS: 0.2 10*3/uL (ref 0.0–0.7)
EOS PCT: 2 %
HCT: 30.4 % — ABNORMAL LOW (ref 36.0–46.0)
Hemoglobin: 9.6 g/dL — ABNORMAL LOW (ref 12.0–15.0)
LYMPHS ABS: 2.5 10*3/uL (ref 0.7–4.0)
Lymphocytes Relative: 19 %
MCH: 27.3 pg (ref 26.0–34.0)
MCHC: 31.6 g/dL (ref 30.0–36.0)
MCV: 86.4 fL (ref 78.0–100.0)
Monocytes Absolute: 0.8 10*3/uL (ref 0.1–1.0)
Monocytes Relative: 6 %
NEUTROS PCT: 73 %
Neutro Abs: 9.7 10*3/uL — ABNORMAL HIGH (ref 1.7–7.7)
PLATELETS: 372 10*3/uL (ref 150–400)
RBC: 3.52 MIL/uL — AB (ref 3.87–5.11)
RDW: 13.9 % (ref 11.5–15.5)
WBC: 13.3 10*3/uL — AB (ref 4.0–10.5)

## 2017-01-14 MED ORDER — OXYMETAZOLINE HCL 0.05 % NA SOLN
1.0000 | Freq: Once | NASAL | Status: AC
  Administered 2017-01-14: 1 via NASAL
  Filled 2017-01-14: qty 15

## 2017-01-14 MED ORDER — SODIUM CHLORIDE 0.9 % IV BOLUS (SEPSIS)
500.0000 mL | Freq: Once | INTRAVENOUS | Status: AC
  Administered 2017-01-14: 500 mL via INTRAVENOUS

## 2017-01-14 MED ORDER — DOXYCYCLINE HYCLATE 100 MG PO TABS
100.0000 mg | ORAL_TABLET | Freq: Once | ORAL | Status: AC
  Administered 2017-01-14: 100 mg via ORAL
  Filled 2017-01-14: qty 1

## 2017-01-14 MED ORDER — OXYCODONE-ACETAMINOPHEN 5-325 MG PO TABS
1.0000 | ORAL_TABLET | Freq: Once | ORAL | Status: AC
  Administered 2017-01-14: 1 via ORAL
  Filled 2017-01-14: qty 1

## 2017-01-14 MED ORDER — DOXYCYCLINE HYCLATE 100 MG PO CAPS: 100.0000 mg | ORAL_CAPSULE | Freq: Two times a day (BID) | ORAL | 0 refills | Status: DC

## 2017-01-14 MED ORDER — HYDRALAZINE HCL 20 MG/ML IJ SOLN
10.0000 mg | Freq: Once | INTRAMUSCULAR | Status: AC
  Administered 2017-01-14: 10 mg via INTRAVENOUS
  Filled 2017-01-14: qty 1

## 2017-01-14 MED ORDER — POTASSIUM CHLORIDE CRYS ER 20 MEQ PO TBCR
40.0000 meq | EXTENDED_RELEASE_TABLET | Freq: Once | ORAL | Status: AC
  Administered 2017-01-14: 40 meq via ORAL
  Filled 2017-01-14: qty 2

## 2017-01-14 MED ORDER — DEXTROSE 5 % IV SOLN
1.0000 g | Freq: Once | INTRAVENOUS | Status: AC
  Administered 2017-01-14: 1 g via INTRAVENOUS
  Filled 2017-01-14: qty 10

## 2017-01-14 NOTE — ED Triage Notes (Addendum)
Pt to ER for evaluation of productive cough, sore throat, nasal congestion, and purulent eye drainage x3-4 days. Voice is muffled, tonsils enlarged. Airway intact. States pressure around sinuses

## 2017-01-14 NOTE — ED Provider Notes (Addendum)
[place] DEPT Provider Note   CSN: [id number] Arrival date & time: 01/14/17  1630  By signing my name below, I, Ephriam Jenkins, attest that this documentation has been prepared under the direction and in the presence of Pattricia Boss, MD. Electronically signed, Ephriam Jenkins, ED Scribe. 01/14/17. 6:55 PM.   History   Chief Complaint Chief Complaint  Patient presents with  . Nasal Congestion  . Sore Throat    HPI HPI Comments: Jasmine Stafford is a 51 y.o. female who presents to the Emergency Department complaining of URI symptoms including productive cough, sore throat, nasal congestion and post nasal drip, onset 3-4 days ago. Pt also reports purulent eye drainage and redness for the past 2 days. She does report decreased appetite but states she has been able to tolerate PO fluids. Pt also endorses mild shortness of breath since arriving to the ED. Pt has taken Zyrtec in attempt to relieve her congestion with no relief. Pt notes that she was dx with Breast Cancer in 2014, finished treatment in 2016. Pt is not a smoker. Her husband is also sick with similar symptoms. She denies any fever.  The history is provided by the patient. No language interpreter was used.    Past Medical History:  Diagnosis Date  . Anemia   . Breast cancer ([place])   . Genital warts   . History of chicken pox   . HTN (hypertension)   . Status post blood transfusion without reported diagnosis 1998, 2002, 2005 x2    Patient Active Problem List   Diagnosis Date Noted  . Cancer of upper-outer quadrant of female breast ([place]) 08/15/2012  . Acute kidney injury ([place]) 04/01/2012  . Hypokalemia 04/01/2012  . Dehydration 04/01/2012  . Gastroenteritis 04/01/2012  . Obesity (BMI 35.0-39.9 without comorbidity) 04/01/2012  . Constipation 04/01/2012  . Anemia 04/01/2012  . Upper respiratory infection 10/21/2010  . Seromucinous otitis media 10/21/2010  . Hypertrophy of tonsil 10/21/2010  . BACK PAIN, THORACIC REGION  06/29/2010  . HYPERTENSION 05/21/2010  . BACK PAIN 05/21/2010    Past Surgical History:  Procedure Laterality Date  . CERVICAL CONE BIOPSY     cervical tumors  . MYOMECTOMY      OB History    No data available       Home Medications    Prior to Admission medications   Medication Sig Start Date End Date Taking? Authorizing Provider  anastrozole (ARIMIDEX) 1 MG tablet Take 1 mg by mouth daily.    [provider]  Ascorbic Acid (VITAMIN C) 1000 MG tablet Take 4,000 mg by mouth daily.    [provider]  BIOTIN PO Take 1 tablet by mouth daily.    [provider]  Calcium Carb-Cholecalciferol (CALCIUM 600 + D) 600-200 MG-UNIT TABS Take 1 tablet by mouth daily.    [provider]  Cholecalciferol (VITAMIN D3) 10000 UNITS capsule Take 10,000 Units by mouth daily.    [provider]  COD LIVER OIL PO Take 530 mg by mouth daily.    [provider]  Coenzyme Q10 (COQ10 PO) Take 20 mg by mouth daily.    [provider]  DANDELION PO Take 520 mg by mouth daily.    [provider]  Digestive Enzymes CAPS Take 2 capsules by mouth 3 (three) times daily with meals.    [provider]  ferrous gluconate (FERGON) 325 MG tablet Take 325 mg by mouth daily.      [provider]  GARLIC PO Take 3 capsules by mouth daily.    [provider]  Goserelin Acetate (ZOLADEX [place]) Inject 1 Applicatorful into the skin every 3 (three) months.    [provider]  hydrALAZINE (APRESOLINE) 100 MG tablet Take 100 mg by mouth 2 (two) times daily.     [provider]  hydrochlorothiazide (HYDRODIURIL) 25 MG tablet Take 25 mg by mouth daily.    [provider]  labetalol (NORMODYNE) 300 MG tablet Take 300 mg by mouth 2 (two) times daily.      [provider]  Lactobacillus (PROBIOTIC ACIDOPHILUS PO) Take 2 tablets by mouth daily.    [provider]  Melatonin 10 MG TABS Take 20  mg by mouth at bedtime.    [provider]  Misc Natural Products (RED [place] COMBINATION PO) Take 375 mg by mouth daily.    [provider]  Multiple Vitamins-Iron (CHLORELLA PO) Take 4 g by mouth daily.    [provider]  nitrofurantoin, macrocrystal-monohydrate, (MACROBID) 100 MG capsule Take 100 mg by mouth 2 (two) times daily.    [provider]  NON FORMULARY 1 each daily. Fibrovan    [provider]  OVER THE COUNTER MEDICATION Take 2 tablets by mouth daily. dimsupreme    [provider]  OVER THE COUNTER MEDICATION Take 10 mLs by mouth every 4 (four) hours as needed (cough). tukol    [provider]  phenazopyridine (PYRIDIUM) 200 MG tablet Take 1 tablet (200 mg total) by mouth 3 (three) times daily as needed for pain. 07/29/14   [name], MD  Phenylephrine-APAP-Guaifenesin (MUCINEX SINUS-MAX CONGESTION PO) Take 2 tablets by mouth every 4 (four) hours as needed (cold).    [provider]  Prenat w/o A-FE-DSS-Methfol-FA (PRENATAL MULTIVITAMIN) 90-600-400 MG-MCG-MCG tablet Take 1 tablet by mouth daily.      [provider]  Turmeric POWD Take 15 mLs by mouth 3 (three) times daily.    [provider]    Family History Family History  Problem Relation Age of Onset  . Colon cancer Other        MGM's maternal half sister  . Breast cancer Mother 24       also diagnosed at 20 and 19  . Diabetes Father   . Hypertension Father   . Prostate cancer Father 15  . Diabetes Brother   . Colon cancer Maternal Aunt   . Breast cancer Maternal Aunt 51  . Rectal cancer Maternal Aunt   . Brain cancer Paternal Aunt        dx in her 7s  . Stomach cancer Paternal Uncle 5  . Lung cancer Maternal Grandfather 9  . Tuberculosis Maternal Grandfather   . Tuberculosis Maternal Grandmother   . Diabetes Paternal Grandmother   . Breast cancer Other 51       Maternal great grandmother  . Lung cancer Paternal  Uncle 8  . Cancer Paternal Uncle        cancerous tumor on his back  . Breast cancer Cousin        paternal cousin dx in her 62s    Social History Social History  Substance Use Topics  . Smoking status: Never Smoker  . Smokeless tobacco: Never Used  . Alcohol use No     Allergies   Other; Shellfish allergy; Erythromycin; Moxifloxacin; Penicillins; and Tequin [gatifloxacin]   Review of Systems Review of Systems   Physical Exam Updated Vital Signs BP (!) 188/88 (BP  Location: Left Arm)   Pulse 74   Temp 99.9 F (37.7 C) (Oral)   Resp 18   Ht 5\' 4"  (1.626 m)   Wt 260 lb (117.9 kg)   SpO2 96%   BMI 44.63 kg/m   Physical Exam  Constitutional: She is oriented to person, place, and time. She appears well-developed and well-nourished. No distress.  HENT:  Head: Normocephalic and atraumatic.  Right Ear: External ear normal.  Left Ear: External ear normal.  Nose: Nose normal.  Mouth/Throat: Oropharynx is clear and moist.  Eyes:  Bilateral conjunctival injection  Neck: Normal range of motion. Neck supple.  Cardiovascular: Normal rate, regular rhythm and normal heart sounds.   Pulmonary/Chest: Effort normal and breath sounds normal.  Abdominal: Soft. Bowel sounds are normal.  Musculoskeletal: Normal range of motion.  Neurological: She is alert and oriented to person, place, and time.  Skin: Skin is warm and dry. Capillary refill takes less than 2 seconds.  Psychiatric: She has a normal mood and affect.  Nursing note and vitals reviewed.    ED Treatments / Results  DIAGNOSTIC STUDIES: Oxygen Saturation is 96% on RA, normal by my interpretation.  COORDINATION OF CARE: 6:51 PM-Discussed treatment plan with pt at bedside and pt agreed to plan.   Labs (all labs ordered are listed, but only abnormal results are displayed) Labs Reviewed  COMPREHENSIVE METABOLIC PANEL - Abnormal; Notable for the following:       Result Value   Potassium 3.3 (*)    Glucose, Bld 115  (*)    Creatinine, Ser 1.06 (*)    Calcium 8.6 (*)    Albumin 3.2 (*)    All other components within normal limits  CBC WITH DIFFERENTIAL/PLATELET - Abnormal; Notable for the following:    WBC 13.3 (*)    RBC 3.52 (*)    Hemoglobin 9.6 (*)    HCT 30.4 (*)    Neutro Abs 9.7 (*)    All other components within normal limits  RAPID STREP SCREEN (NOT AT [facility])  CULTURE, GROUP A STREP [facility])    EKG  EKG Interpretation None       Radiology Dg Chest 2 View  Result Date: 01/14/2017 CLINICAL DATA:  Initial evaluation for productive cough. EXAM: CHEST  2 VIEW COMPARISON:  Prior radiograph from 03/31/2012. FINDINGS: Mild cardiomegaly, stable. Mediastinal silhouette within normal limits. Lungs normally inflated. Mild diffuse peribronchial thickening, which may reflect sequelae of acute bronchiolitis given history of cough. No few scattered superimposed patchy opacities present within the bilateral lung bases, which may reflect atelectasis and/or early/mild superimposed pneumonia. No overt pulmonary edema. No pleural effusion. No pneumothorax. No acute osseus abnormality. IMPRESSION: Mild scattered peribronchial thickening, suggesting acute bronchiolitis given the history of cough. Subtle more confluent patchy opacities within the bilateral lung bases may reflect superimposed atelectasis and/or superimposed early/mild infiltrates. Electronically Signed   By: Jeannine Boga M.D.   On: 01/14/2017 17:15    Procedures Procedures (including critical care time)  Medications Ordered in ED Medications - No data to display   Initial Impression / Assessment and Plan / ED Course  I have reviewed the triage vital signs and the nursing notes.  Pertinent labs & imaging results that were available during my care of the patient were reviewed by me and considered in my medical decision making (see chart for details).     Patient with apparent viral infection with conjunctival injection, sore throat,  coughing, and fever. Chest x-Soham Hollett here has question of GI  opacities. Patient has multiple antibiotic allergies and was treated here with Rocephin IV and by mouth doxycycline. Patient is well appearing. She is hypertensive. She has known hypertension. She has not taken her  medications. She is given a dose of IV hydralazine here. Blood pressure is elevated but likely due to situation and not taking medications today. She is mildly hypokalemic and was orally repleted here. Plan to discharge patient to home on doxycycline. She is advised regarding close follow-up and need for recheck and return if she is worse anytime.  1- viral infection 2- ?multifocal cap- treated with iv abx and po doxycycline,  3- hypokalemia 4-anemia- stable 5- hypertension- noncompliant today, recheck with pmd next 1-2 days  Final Clinical Impressions(s) / ED Diagnoses   Final diagnoses:  Community acquired pneumonia, unspecified laterality  Hypertension, unspecified type  Anemia, unspecified type    New Prescriptions New Prescriptions   No medications on file  I personally performed the services described in this documentation, which was scribed in my presence. The recorded information has been reviewed and considered.    Pattricia Boss, MD 01/14/17 [id number]    Pattricia Boss, MD 01/14/17 2053

## 2017-01-14 NOTE — Discharge Instructions (Signed)
Please recheck with your doctor 1-2 days for infection recheck and bp recheck Take all medication as prescribed Return if you are worse at any time

## 2017-01-14 NOTE — ED Notes (Signed)
Dr. Hart Robinsons notified on pt.'s hypertension .

## 2017-01-17 LAB — CULTURE, GROUP A STREP (THRC)

## 2017-08-13 ENCOUNTER — Encounter (HOSPITAL_COMMUNITY): Payer: Self-pay

## 2017-08-13 ENCOUNTER — Other Ambulatory Visit: Payer: Self-pay

## 2017-08-13 ENCOUNTER — Emergency Department (HOSPITAL_COMMUNITY): Payer: 59

## 2017-08-13 ENCOUNTER — Inpatient Hospital Stay (HOSPITAL_COMMUNITY)
Admission: EM | Admit: 2017-08-13 | Discharge: 2017-08-18 | DRG: 194 | Disposition: A | Discharge status: Home / Self Care | Payer: 59 | Attending: Internal Medicine | Admitting: Internal Medicine

## 2017-08-13 DIAGNOSIS — R0602 Shortness of breath: Secondary | ICD-10-CM | POA: Diagnosis not present

## 2017-08-13 DIAGNOSIS — R05 Cough: Secondary | ICD-10-CM | POA: Diagnosis not present

## 2017-08-13 DIAGNOSIS — Z8042 Family history of malignant neoplasm of prostate: Secondary | ICD-10-CM | POA: Diagnosis not present

## 2017-08-13 DIAGNOSIS — I10 Essential (primary) hypertension: Secondary | ICD-10-CM | POA: Diagnosis present

## 2017-08-13 DIAGNOSIS — Z808 Family history of malignant neoplasm of other organs or systems: Secondary | ICD-10-CM

## 2017-08-13 DIAGNOSIS — J9801 Acute bronchospasm: Secondary | ICD-10-CM | POA: Diagnosis present

## 2017-08-13 DIAGNOSIS — Z881 Allergy status to other antibiotic agents status: Secondary | ICD-10-CM

## 2017-08-13 DIAGNOSIS — Z801 Family history of malignant neoplasm of trachea, bronchus and lung: Secondary | ICD-10-CM | POA: Diagnosis not present

## 2017-08-13 DIAGNOSIS — M546 Pain in thoracic spine: Secondary | ICD-10-CM

## 2017-08-13 DIAGNOSIS — I160 Hypertensive urgency: Secondary | ICD-10-CM | POA: Diagnosis not present

## 2017-08-13 DIAGNOSIS — Z803 Family history of malignant neoplasm of breast: Secondary | ICD-10-CM

## 2017-08-13 DIAGNOSIS — J181 Lobar pneumonia, unspecified organism: Secondary | ICD-10-CM | POA: Diagnosis not present

## 2017-08-13 DIAGNOSIS — J189 Pneumonia, unspecified organism: Principal | ICD-10-CM | POA: Diagnosis present

## 2017-08-13 DIAGNOSIS — J069 Acute upper respiratory infection, unspecified: Secondary | ICD-10-CM | POA: Diagnosis not present

## 2017-08-13 DIAGNOSIS — Z79811 Long term (current) use of aromatase inhibitors: Secondary | ICD-10-CM | POA: Diagnosis not present

## 2017-08-13 DIAGNOSIS — C50411 Malignant neoplasm of upper-outer quadrant of right female breast: Secondary | ICD-10-CM | POA: Diagnosis not present

## 2017-08-13 DIAGNOSIS — Z91018 Allergy to other foods: Secondary | ICD-10-CM | POA: Diagnosis not present

## 2017-08-13 DIAGNOSIS — Z800 Family history of malignant neoplasm of digestive organs: Secondary | ICD-10-CM

## 2017-08-13 DIAGNOSIS — J188 Other pneumonia, unspecified organism: Secondary | ICD-10-CM | POA: Diagnosis not present

## 2017-08-13 DIAGNOSIS — Z91013 Allergy to seafood: Secondary | ICD-10-CM | POA: Diagnosis not present

## 2017-08-13 DIAGNOSIS — D509 Iron deficiency anemia, unspecified: Secondary | ICD-10-CM | POA: Diagnosis present

## 2017-08-13 DIAGNOSIS — R1115 Cyclical vomiting syndrome unrelated to migraine: Secondary | ICD-10-CM | POA: Insufficient documentation

## 2017-08-13 DIAGNOSIS — Z6841 Body Mass Index (BMI) 40.0 and over, adult: Secondary | ICD-10-CM | POA: Diagnosis not present

## 2017-08-13 DIAGNOSIS — G43A1 Cyclical vomiting, intractable: Secondary | ICD-10-CM | POA: Diagnosis not present

## 2017-08-13 DIAGNOSIS — R0600 Dyspnea, unspecified: Secondary | ICD-10-CM | POA: Diagnosis not present

## 2017-08-13 DIAGNOSIS — C50419 Malignant neoplasm of upper-outer quadrant of unspecified female breast: Secondary | ICD-10-CM | POA: Diagnosis present

## 2017-08-13 DIAGNOSIS — G4733 Obstructive sleep apnea (adult) (pediatric): Secondary | ICD-10-CM | POA: Diagnosis not present

## 2017-08-13 DIAGNOSIS — I119 Hypertensive heart disease without heart failure: Secondary | ICD-10-CM | POA: Diagnosis present

## 2017-08-13 DIAGNOSIS — Z880 Allergy status to penicillin: Secondary | ICD-10-CM

## 2017-08-13 LAB — COMPREHENSIVE METABOLIC PANEL
ALT: 29 U/L (ref 14–54)
AST: 38 U/L (ref 15–41)
Albumin: 3.8 g/dL (ref 3.5–5.0)
Alkaline Phosphatase: 85 U/L (ref 38–126)
Anion gap: 13 (ref 5–15)
BUN: 5 mg/dL — ABNORMAL LOW (ref 6–20)
CO2: 22 mmol/L (ref 22–32)
Calcium: 8.8 mg/dL — ABNORMAL LOW (ref 8.9–10.3)
Chloride: 99 mmol/L — ABNORMAL LOW (ref 101–111)
Creatinine, Ser: 0.92 mg/dL (ref 0.44–1.00)
GFR calc Af Amer: >60 mL/min (ref >60)
GFR calc non Af Amer: >60 mL/min (ref >60)
Glucose, Bld: 111 mg/dL — ABNORMAL HIGH (ref 65–99)
Potassium: 3.6 mmol/L (ref 3.5–5.1)
Sodium: 134 mmol/L — ABNORMAL LOW (ref 135–145)
Total Bilirubin: 0.8 mg/dL (ref 0.3–1.2)
Total Protein: 8 g/dL (ref 6.5–8.1)

## 2017-08-13 LAB — INFLUENZA PANEL BY PCR (TYPE A & B)
Influenza A By PCR: NEGATIVE
Influenza B By PCR: NEGATIVE

## 2017-08-13 LAB — I-STAT TROPONIN, ED: Troponin i, poc: 0.01 ng/mL (ref 0.00–0.08)

## 2017-08-13 LAB — URINALYSIS, ROUTINE W REFLEX MICROSCOPIC
Bilirubin Urine: NEGATIVE
Glucose, UA: NEGATIVE mg/dL
Hgb urine dipstick: NEGATIVE
Ketones, ur: 5 mg/dL — AB
Leukocytes, UA: NEGATIVE
Nitrite: NEGATIVE
Protein, ur: 100 mg/dL — AB
Specific Gravity, Urine: 1.016 (ref 1.005–1.030)
pH: 8 (ref 5.0–8.0)

## 2017-08-13 LAB — CBC
HCT: 29.1 % — ABNORMAL LOW (ref 36.0–46.0)
Hemoglobin: 8.8 g/dL — ABNORMAL LOW (ref 12.0–15.0)
MCH: 22.3 pg — ABNORMAL LOW (ref 26.0–34.0)
MCHC: 30.2 g/dL (ref 30.0–36.0)
MCV: 73.7 fL — ABNORMAL LOW (ref 78.0–100.0)
Platelets: 423 10*3/uL — ABNORMAL HIGH (ref 150–400)
RBC: 3.95 MIL/uL (ref 3.87–5.11)
RDW: 18.1 % — ABNORMAL HIGH (ref 11.5–15.5)
WBC: 9.5 10*3/uL (ref 4.0–10.5)

## 2017-08-13 LAB — DIFFERENTIAL
Basophils Absolute: 0.1 10*3/uL (ref 0.0–0.1)
Basophils Relative: 1 %
Eosinophils Absolute: 0.1 10*3/uL (ref 0.0–0.7)
Eosinophils Relative: 1 %
Lymphocytes Relative: 11 %
Lymphs Abs: 1 10*3/uL (ref 0.7–4.0)
Monocytes Absolute: 0.4 10*3/uL (ref 0.1–1.0)
Monocytes Relative: 5 %
Neutro Abs: 7.6 10*3/uL (ref 1.7–7.7)
Neutrophils Relative %: 82 %

## 2017-08-13 LAB — I-STAT CG4 LACTIC ACID, ED
LACTIC ACID, VENOUS: 1.63 mmol/L (ref 0.5–1.9)
Lactic Acid, Venous: 1.74 mmol/L (ref 0.5–1.9)

## 2017-08-13 LAB — I-STAT BETA HCG BLOOD, ED (MC, WL, AP ONLY): I-stat hCG, quantitative: <5 m[IU]/mL (ref <5)

## 2017-08-13 LAB — BRAIN NATRIURETIC PEPTIDE: B Natriuretic Peptide: 101.1 pg/mL — ABNORMAL HIGH (ref 0.0–100.0)

## 2017-08-13 LAB — LIPASE, BLOOD: Lipase: 24 U/L (ref 11–51)

## 2017-08-13 MED ORDER — SODIUM CHLORIDE 0.9 % IV SOLN
INTRAVENOUS | Status: AC
  Administered 2017-08-13: 23:00:00 via INTRAVENOUS

## 2017-08-13 MED ORDER — IOPAMIDOL (ISOVUE-370) INJECTION 76%
INTRAVENOUS | Status: AC
  Administered 2017-08-13: 100 mL via INTRAVENOUS
  Filled 2017-08-13: qty 100

## 2017-08-13 MED ORDER — PROMETHAZINE HCL 25 MG/ML IJ SOLN
25.0000 mg | Freq: Once | INTRAMUSCULAR | Status: AC
  Administered 2017-08-13: 25 mg via INTRAVENOUS
  Filled 2017-08-13: qty 1

## 2017-08-13 MED ORDER — ANASTROZOLE 1 MG PO TABS
1.0000 mg | ORAL_TABLET | Freq: Every day | ORAL | Status: DC
  Administered 2017-08-15 – 2017-08-18 (×4): 1 mg via ORAL
  Filled 2017-08-13 (×5): qty 1

## 2017-08-13 MED ORDER — HYDROCODONE-ACETAMINOPHEN 5-325 MG PO TABS: 1.0000 | ORAL_TABLET | ORAL | Status: DC | PRN

## 2017-08-13 MED ORDER — DOXYCYCLINE HYCLATE 100 MG PO TABS
100.0000 mg | ORAL_TABLET | Freq: Two times a day (BID) | ORAL | Status: DC
  Administered 2017-08-14 – 2017-08-18 (×9): 100 mg via ORAL
  Filled 2017-08-13 (×9): qty 1

## 2017-08-13 MED ORDER — PROMETHAZINE HCL 25 MG/ML IJ SOLN: 12.5000 mg | Freq: Four times a day (QID) | INTRAMUSCULAR | Status: DC | PRN

## 2017-08-13 MED ORDER — GUAIFENESIN 100 MG/5ML PO SOLN
20.0000 mL | ORAL | Status: DC | PRN
  Administered 2017-08-14 – 2017-08-16 (×4): 20 mL via ORAL
  Administered 2017-08-17: 400 mg via ORAL
  Administered 2017-08-17: 20 mL via ORAL
  Filled 2017-08-13 (×7): qty 20

## 2017-08-13 MED ORDER — LABETALOL HCL 200 MG PO TABS
200.0000 mg | ORAL_TABLET | Freq: Two times a day (BID) | ORAL | Status: DC
  Administered 2017-08-14 – 2017-08-18 (×10): 200 mg via ORAL
  Filled 2017-08-13 (×11): qty 1

## 2017-08-13 MED ORDER — HYDRALAZINE HCL 20 MG/ML IJ SOLN
20.0000 mg | Freq: Once | INTRAMUSCULAR | Status: AC
  Administered 2017-08-13: 20 mg via INTRAVENOUS
  Filled 2017-08-13: qty 1

## 2017-08-13 MED ORDER — ONDANSETRON 4 MG PO TBDP: 4.0000 mg | ORAL_TABLET | Freq: Four times a day (QID) | ORAL | Status: DC | PRN

## 2017-08-13 MED ORDER — HYDRALAZINE HCL 25 MG PO TABS
100.0000 mg | ORAL_TABLET | Freq: Two times a day (BID) | ORAL | Status: DC
  Administered 2017-08-14 – 2017-08-15 (×5): 100 mg via ORAL
  Filled 2017-08-13 (×5): qty 4

## 2017-08-13 MED ORDER — DEXTROSE 5 % IV SOLN
1.0000 g | INTRAVENOUS | Status: DC
  Administered 2017-08-14 – 2017-08-17 (×4): 1 g via INTRAVENOUS
  Filled 2017-08-13 (×5): qty 10

## 2017-08-13 MED ORDER — IPRATROPIUM-ALBUTEROL 0.5-2.5 (3) MG/3ML IN SOLN
3.0000 mL | Freq: Once | RESPIRATORY_TRACT | Status: AC
  Administered 2017-08-13: 3 mL via RESPIRATORY_TRACT
  Filled 2017-08-13: qty 3

## 2017-08-13 MED ORDER — ADULT MULTIVITAMIN W/MINERALS CH
1.0000 | ORAL_TABLET | Freq: Every day | ORAL | Status: DC
  Administered 2017-08-14 – 2017-08-17 (×5): 1 via ORAL
  Filled 2017-08-13 (×5): qty 1

## 2017-08-13 MED ORDER — SODIUM CHLORIDE 0.9 % IV BOLUS (SEPSIS)
1000.0000 mL | Freq: Once | INTRAVENOUS | Status: AC
Start: 2017-08-13 — End: 2017-08-13
  Administered 2017-08-13: 1000 mL via INTRAVENOUS

## 2017-08-13 MED ORDER — ACETAMINOPHEN 325 MG PO TABS: 650.0000 mg | ORAL_TABLET | Freq: Four times a day (QID) | ORAL | Status: DC | PRN

## 2017-08-13 MED ORDER — ONDANSETRON 4 MG PO TBDP
4.0000 mg | ORAL_TABLET | Freq: Once | ORAL | Status: AC
  Administered 2017-08-13: 4 mg via ORAL
  Filled 2017-08-13: qty 1

## 2017-08-13 MED ORDER — FAMOTIDINE IN NACL 20-0.9 MG/50ML-% IV SOLN
20.0000 mg | Freq: Two times a day (BID) | INTRAVENOUS | Status: DC
  Administered 2017-08-14 – 2017-08-15 (×4): 20 mg via INTRAVENOUS
  Filled 2017-08-13 (×4): qty 50

## 2017-08-13 MED ORDER — CALCIUM CARBONATE-VITAMIN D 500-200 MG-UNIT PO TABS
1.0000 | ORAL_TABLET | Freq: Every day | ORAL | Status: DC
  Administered 2017-08-15 – 2017-08-18 (×4): 1 via ORAL
  Filled 2017-08-13 (×5): qty 1

## 2017-08-13 MED ORDER — ONDANSETRON 4 MG PO TBDP
4.0000 mg | ORAL_TABLET | Freq: Once | ORAL | Status: AC | PRN
  Administered 2017-08-13: 4 mg via ORAL

## 2017-08-13 MED ORDER — DOXYCYCLINE HYCLATE 100 MG PO TABS
100.0000 mg | ORAL_TABLET | Freq: Once | ORAL | Status: AC
  Administered 2017-08-13: 100 mg via ORAL
  Filled 2017-08-13: qty 1

## 2017-08-13 MED ORDER — CEFTRIAXONE SODIUM 1 G IJ SOLR
1.0000 g | Freq: Once | INTRAMUSCULAR | Status: AC
  Administered 2017-08-13: 1 g via INTRAVENOUS
  Filled 2017-08-13: qty 10

## 2017-08-13 MED ORDER — HYDRALAZINE HCL 20 MG/ML IJ SOLN
10.0000 mg | INTRAMUSCULAR | Status: DC | PRN
  Administered 2017-08-14 – 2017-08-16 (×6): 10 mg via INTRAVENOUS
  Filled 2017-08-13 (×6): qty 1

## 2017-08-13 NOTE — ED Notes (Signed)
Patient transported to CT 

## 2017-08-13 NOTE — ED Provider Notes (Signed)
Patient placed in Quick Look pathway, seen and evaluated   Chief Complaint: Emesis  HPI:   52 year old female presents today with complaints of nausea vomiting.  Patient notes yesterday she had small cough and upper respiratory congestion.  Last night started to have vomiting with an episode of diarrhea.  She notes coughing through the night which is caused abdominal discomfort, no localization of abdominal pain.  Patient denies any associated chest pain at this time.  ROS: Denies chest pain, positive for abdominal pain, positive for shortness of breath, positive for cough  Physical Exam:   Gen: Lightly diaphoretic  Neuro: Awake and Alert  Skin: Warm    Focused Exam: No respiratory distress, lung sounds clear with normal expansion-cardiac regular rate, no murmur, normal cardiac sounds.   Initiation of care has begun. The patient has been counseled on the process, plan, and necessity for staying for the completion/evaluation, and the remainder of the medical screening examination  .   Okey Regal, PA-C 08/13/17 1424    Isla Pence, MD 08/13/17 [phone]

## 2017-08-13 NOTE — ED Notes (Signed)
Pt states that because she's been experiencing emesis since this morning she was NOT able to take her BP medication.

## 2017-08-13 NOTE — H&P (Signed)
History and Physical    Jasmine Stafford TKW:[id number] DOB: Jul 09, 1966 DOA: 08/13/2017  PCP: Cassandria Anger, MD   Patient coming from: Home  Chief Complaint: SOB, cough, N/V, fevers/chills   HPI: Jasmine Stafford is a 52 y.o. female with medical history significant for breast cancer, hypertension, and iron deficiency anemia, now presenting to the emergency department for evaluation of fevers, chills, cough, shortness of breath, and nausea with nonbloody vomiting.  Patient reports that she had been in her usual state of health until 3-4 days ago when she developed general malaise, subjective fevers and chills, congestion, and cough.  She also developed nausea and has had nonbloody vomiting that has persisted since then.  No significant abdominal pain or diarrhea.  Cough is occasionally productive of thick sputum.  She is short of breath but denies any chest pain or palpitations.  Denies any sick contacts or recent travel.  ED Course: Upon arrival to the ED, patient is found to be afebrile, saturating low 90s on room air, mildly tachypneic, and hypertensive to 240/110.  EKG features a sinus rhythm with PAC.  Chest x-ray is notable for cardiomegaly and atelectasis without acute cardiopulmonary disease.  Chemistry panel features a mild hyponatremia and CBC is notable for a hemoglobin of 8.6, down from 9.6 last June, and MCV of 73.7, previously normal.  Lactic acid is reassuringly normal and troponin is also within the normal limits.  Urinalysis CTA chest is negative for PE but demonstrates patchy airspace opacities in the left lower lobe and lingula concerning for pneumonia.  Blood cultures were collected, 1 L of normal saline was administered, and the patient was treated with empiric doxycycline and Rocephin in the ED.  She was treated with IV hydralazine x2.  Blood pressure improved, she remained hemodynamically stable, continues to be mildly tachypneic but not in acute distress, and she will be  admitted to the medical-surgical unit for ongoing evaluation and management of community-acquired pneumonia.  Review of Systems:  All other systems reviewed and apart from HPI, are negative.  Past Medical History:  Diagnosis Date  . Anemia   . Breast cancer ([place])   . Genital warts   . History of chicken pox   . HTN (hypertension)   . Status post blood transfusion without reported diagnosis 1998, 2002, 2005 x2    Past Surgical History:  Procedure Laterality Date  . CERVICAL CONE BIOPSY     cervical tumors  . MYOMECTOMY       reports that  has never smoked. she has never used smokeless tobacco. She reports that she does not drink alcohol or use drugs.  Allergies  Allergen Reactions  . Other Hives    Reaction to Tomatoes & Mushrooms  . Shellfish Allergy Hives  . Erythromycin Hives  . Moxifloxacin Hives  . Penicillins Hives    Has patient had a PCN reaction causing immediate rash, facial/tongue/throat swelling, SOB or lightheadedness with hypotension: Yes Has patient had a PCN reaction causing severe rash involving mucus membranes or skin necrosis: No Has patient had a PCN reaction that required hospitalization: No Has patient had a PCN reaction occurring within the last 10 years: No If all of the above answers are "NO", then may proceed with Cephalosporin use.  Flo Shanks [Gatifloxacin] Hives    Family History  Problem Relation Age of Onset  . Colon cancer Other        MGM's maternal half sister  . Breast cancer Mother 70  also diagnosed at 42 and 55  . Diabetes Father   . Hypertension Father   . Prostate cancer Father 84  . Diabetes Brother   . Colon cancer Maternal Aunt   . Breast cancer Maternal Aunt 51  . Rectal cancer Maternal Aunt   . Brain cancer Paternal Aunt        dx in her 34s  . Stomach cancer Paternal Uncle 70  . Lung cancer Maternal Grandfather 27  . Tuberculosis Maternal Grandfather   . Tuberculosis Maternal Grandmother   . Diabetes Paternal  Grandmother   . Breast cancer Other 4       Maternal great grandmother  . Lung cancer Paternal Uncle 29  . Cancer Paternal Uncle        cancerous tumor on his back  . Breast cancer Cousin        paternal cousin dx in her 27s     Prior to Admission medications   Medication Sig Start Date End Date Taking? Authorizing Provider  anastrozole (ARIMIDEX) 1 MG tablet Take 1 mg by mouth daily.   Yes [provider]  Ascorbic Acid (VITAMIN C) 1000 MG tablet Take 3,000 mg by mouth daily.    Yes [provider]  Calcium Carb-Cholecalciferol (CALCIUM 600 + D) 600-200 MG-UNIT TABS Take 1 tablet by mouth daily.   Yes [provider]  Cholecalciferol (VITAMIN D3) 10000 UNITS capsule Take 10,000 Units by mouth daily.   Yes [provider]  Coenzyme Q10 (COQ10) 400 MG CAPS Take 400 mg by mouth daily.   Yes [provider]  DANDELION PO Take 2 capsules by mouth daily.    Yes [provider]  Digestive Enzymes CAPS Take 2 capsules by mouth 3 (three) times daily with meals.   Yes [provider]  ferrous gluconate (FERGON) 325 MG tablet Take 325 mg by mouth daily.     Yes [provider]  GARLIC PO Take 3 capsules by mouth daily.   Yes [provider]  guaiFENesin (ROBITUSSIN) 100 MG/5ML SOLN Take 20 mLs by mouth every 4 (four) hours as needed for cough or to loosen phlegm.   Yes [provider]  Guaifenesin 1200 MG TB12 Take 1,200 mg by mouth 2 (two) times daily as needed (cough).   Yes [provider]  hydrALAZINE (APRESOLINE) 100 MG tablet Take 100 mg by mouth 2 (two) times daily.    Yes [provider]  hydrochlorothiazide (HYDRODIURIL) 25 MG tablet Take 25 mg by mouth daily with lunch.    Yes [provider]  labetalol (NORMODYNE) 100 MG tablet Take 200 mg by mouth 2 (two) times daily.    Yes [provider]  Lactobacillus (PROBIOTIC ACIDOPHILUS PO) Take 2 tablets by mouth daily.    Yes [provider]  Melatonin 10 MG TABS Take 20 mg by mouth at bedtime as needed (sleep).    Yes [provider]  Multiple Vitamin (MULTIVITAMIN WITH MINERALS) TABS tablet Take 1 tablet by mouth at bedtime.   Yes [provider]  Multiple Vitamins-Iron (CHLORELLA PO) Take 4 g by mouth daily.   Yes [provider]  OVER THE COUNTER MEDICATION Take 2 capsules by mouth daily. Dim Supreme vegetarian capsules (from cruciferous vegetables)   Yes [provider]  Turmeric POWD Take 7.5 mLs by mouth at bedtime.    Yes [provider]    Physical Exam: Vitals:   08/13/17 1845 08/13/17 1945 08/13/17 2115 08/13/17 2145  BP: Marland Kitchen)  213/104 (!) 185/86 (!) 206/93 (!) 172/87  Pulse: 83 90 92 98  Resp: (!) 22 (!) 24 (!) 25 (!) 24  Temp:      TempSrc:      SpO2: 100% 98% 99% 99%  Weight:      Height:          Constitutional: NAD, calm, in obvious discomfort Eyes: PERTLA, lids and conjunctivae normal ENMT: Mucous membranes are moist. Posterior pharynx clear of any exudate or lesions.   Neck: normal, supple, no masses, no thyromegaly Respiratory: Rhonchi at bases, dyspnea with speech, mild tachypnea. No pallor or cyanosis.  Cardiovascular: S1 & S2 heard, regular rate and rhythm. No significant JVD. Abdomen: No distension, no tenderness, no masses palpated. Bowel sounds normal.  Musculoskeletal: no clubbing / cyanosis. No joint deformity upper and lower extremities.  Skin: no significant rashes, lesions, ulcers. Warm, dry, well-perfused. Neurologic: CN 2-12 grossly intact. Sensation intact. Strength 5/5 in all 4 limbs.  Psychiatric:  Alert and oriented x 3. Calm, cooperative.     Labs on Admission: I have personally reviewed following labs and imaging studies  CBC: Recent Labs  Lab 08/13/17 1422  WBC 9.5  NEUTROABS 7.6  HGB 8.8*  HCT 29.1*  MCV 73.7*  PLT 696*   Basic Metabolic Panel: Recent Labs  Lab 08/13/17 1422  NA 134*  K  3.6  CL 99*  CO2 22  GLUCOSE 111*  BUN 5*  CREATININE 0.92  CALCIUM 8.8*   GFR: Estimated Creatinine Clearance: 94.5 mL/min (by C-G formula based on SCr of 0.92 mg/dL). Liver Function Tests: Recent Labs  Lab 08/13/17 1422  AST 38  ALT 29  ALKPHOS 85  BILITOT 0.8  PROT 8.0  ALBUMIN 3.8   Recent Labs  Lab 08/13/17 1422  LIPASE 24   No results for input(s): AMMONIA in the last 168 hours. Coagulation Profile: No results for input(s): INR, PROTIME in the last 168 hours. Cardiac Enzymes: No results for input(s): CKTOTAL, CKMB, CKMBINDEX, TROPONINI in the last 168 hours. BNP (last 3 results) No results for input(s): PROBNP in the last 8760 hours. HbA1C: No results for input(s): HGBA1C in the last 72 hours. CBG: No results for input(s): GLUCAP in the last 168 hours. Lipid Profile: No results for input(s): CHOL, HDL, LDLCALC, TRIG, CHOLHDL, LDLDIRECT in the last 72 hours. Thyroid Function Tests: No results for input(s): TSH, T4TOTAL, FREET4, T3FREE, THYROIDAB in the last 72 hours. Anemia Panel: No results for input(s): VITAMINB12, FOLATE, FERRITIN, TIBC, IRON, RETICCTPCT in the last 72 hours. Urine analysis:    Component Value Date/Time   COLORURINE YELLOW 08/13/2017 [place] 08/13/2017 1518   LABSPEC 1.016 08/13/2017 1518   PHURINE 8.0 08/13/2017 1518   GLUCOSEU NEGATIVE 08/13/2017 1518   HGBUR NEGATIVE 08/13/2017 1518   BILIRUBINUR NEGATIVE 08/13/2017 1518   KETONESUR 5 (A) 08/13/2017 1518   PROTEINUR 100 (A) 08/13/2017 1518   UROBILINOGEN 0.2 07/28/2014 1901   NITRITE NEGATIVE 08/13/2017 1518   LEUKOCYTESUR NEGATIVE 08/13/2017 1518   Sepsis Labs: @LABRCNTIP (procalcitonin:4,lacticidven:4) )No results found for this or any previous visit (from the past 240 hour(s)).   Radiological Exams on Admission: Dg Chest 2 View  Result Date: 08/13/2017 CLINICAL DATA:  Congestion, nausea and vomiting cough for the past day. EXAM: CHEST  2 VIEW COMPARISON:   01/14/2017; 03/31/2012 FINDINGS: Grossly unchanged enlarged cardiac silhouette and mediastinal contours. Overall improved aeration of the lungs with persistent bilateral infrahilar opacities favored to represent atelectasis. No new focal airspace  opacities. No definite evidence of edema. No pleural effusion or pneumothorax. No acute osseus abnormalities. IMPRESSION: Cardiomegaly and bilateral infrahilar atelectasis without superimposed acute cardiopulmonary disease. Electronically Signed   By: Sandi Mariscal M.D.   On: 08/13/2017 17:46   Ct Angio Chest Pe W And/or Wo Contrast  Result Date: 08/13/2017 CLINICAL DATA:  52 year old with acute onset of cough, shortness of breath and abdominal pain. Personal history of right breast cancer 5 years ago. EXAM: CT ANGIOGRAPHY CHEST WITH CONTRAST TECHNIQUE: Multidetector CT imaging of the chest was performed using the standard protocol during bolus administration of intravenous contrast. Multiplanar CT image reconstructions and MIPs were obtained to evaluate the vascular anatomy. CONTRAST:  126mL ISOVUE-[id number] IOPAMIDOL INJECTION 76% IV. COMPARISON:  No prior CT. Multiple prior chest x-rays including earlier same day. FINDINGS: Cardiovascular: Contrast opacification of pulmonary arteries is moderate at best. No filling defects within either main pulmonary artery or their central branches to suggest pulmonary embolism. Heart moderately enlarged. No visible coronary atherosclerosis. No visible atherosclerosis involving the thoracic or upper abdominal aorta. Mediastinum/Nodes: No pathologically enlarged mediastinal, hilar or axillary lymph nodes. No mediastinal masses. Normal-appearing esophagus. Visualized thyroid gland normal in appearance. Lungs/Pleura: Patchy airspace opacities involving the inferior left lower lobe and lingula, the left lower lobe, and to a lesser degree the right lower lobe. No significant airspace disease involving the right upper lobe. No pulmonary  parenchymal nodules or masses. Central airways patent with moderate to marked bronchial wall thickening. No pleural effusions. Upper Abdomen: Visualized upper abdomen unremarkable for the early arterial phase of enhancement. Musculoskeletal: Degenerative disc disease and spondylosis involving the upper and midthoracic spine. No acute abnormalities. Review of the MIP images confirms the above findings. Electronically Signed   By: Evangeline Dakin M.D.   On: 08/13/2017 20:45    EKG: Independently reviewed. Sinus rhythm, PAC.  Assessment/Plan  1. Pneumonia  - Presents with 3-4 days cough, SOB, N/V, and subjective fevers  - Imaging consistent with pneumonia, influenza PCR negative, lactic acid reassuringly normal - Blood cultures collected in ED and empiric Rocephin and doxycycline started  - Given comorbidity including severe anemia, morbid obesity, breast cancer, and her N/V with inability to take her medications at home today, she will require admission for treatment  - Add sputum culture, strep pneumo antigen, procalcitonin  - Continue current abx, supportive care    2. Hypertension with hypertensive urgency  - BP 240/110 in ED without chest pain or headache  - Pt reports N/V has left her unable to tolerate her BP meds at home  - Treated with IV hydralazine in ED and improved  - Continue prn hydralazine IVP's, resume labetalol and oral hydralazine when condition allows   3. Microcytic anemia  - Hgb is 8.8, down from 9.6 last June; MCV was previously normal, now 73.7  - Check anemia panel, continue iron-supplementation   4. Breast cancer  - Continue Arimidex, continue oncology follow-up    DVT prophylaxis: SCD's Code Status: Full  Family Communication: Mother updated at bedside at patient's request Disposition Plan: Admit to med-surg Consults called: None Admission status: Inpatient    Vianne Bulls, MD [facility] Pager [phone]  If 7PM-7AM, please contact  night-coverage [email or web address] Password [facility]  08/13/2017, 10:35 PM

## 2017-08-13 NOTE — ED Triage Notes (Signed)
Pt reports n/v, sob, respiratory infection since yesterday. PT diaphoretic in triage. Actively vomiting. PT hypertensive and reports not taking bp meds d/t vomiting

## 2017-08-13 NOTE — ED Provider Notes (Signed)
[facility] EMERGENCY DEPARTMENT Provider Note   CSN: [id number] Arrival date & time: 08/13/17  1359     History   Chief Complaint Chief Complaint  Patient presents with  . Emesis  . Shortness of Breath    HPI Jasmine Stafford is a 52 y.o. female with history of hypertension, breast cancer presents today with chief complaint acute onset, progressively worsening cough, shortness of breath, nausea, and vomiting.  She states that her symptoms began with a cough productive of clear sputum and "chest congestion" 4 days ago.  She states that yesterday she began vomiting and has had multiple episodes of nonbloody nonbilious emesis since then.  She states that her symptoms began after eating a [place] sandwich, berries, and carrot sticks.  She notes that she has had sick contacts at work with similar symptoms without the nausea and vomiting.  She states that she had one episode of nonbloody watery stools this morning.  She states that she has not been able to keep any food or drink down today but was able to keep fluids down yesterday.  She has not been able to take any of her medications including her blood pressure medication due to her persistent vomiting.  She notes aching pain at her flanks and "around my diaphragm" secondary to persistent cough.  She endorses shortness of breath with cough but denies chest pain.  Denies fevers or chills at home but temperature of 100 F in the ED today.  Endorses urinary frequency associated with her cough but denies dysuria, hematuria, melena, hematochezia, or vaginal symptoms.  The history is provided by the patient.    Past Medical History:  Diagnosis Date  . Anemia   . Breast cancer ([place])   . Genital warts   . History of chicken pox   . HTN (hypertension)   . Status post blood transfusion without reported diagnosis 1998, 2002, 2005 x2    Patient Active Problem List   Diagnosis Date Noted  . Hypertensive urgency 08/13/2017  . CAP  (community acquired pneumonia) 08/13/2017  . Intractable cyclical vomiting with nausea   . Cancer of upper-outer quadrant of female breast ([place]) 08/15/2012  . Acute kidney injury ([place]) 04/01/2012  . Hypokalemia 04/01/2012  . Dehydration 04/01/2012  . Gastroenteritis 04/01/2012  . Obesity (BMI 35.0-39.9 without comorbidity) 04/01/2012  . Constipation 04/01/2012  . Microcytic anemia 04/01/2012  . Upper respiratory infection 10/21/2010  . Seromucinous otitis media 10/21/2010  . Hypertrophy of tonsil 10/21/2010  . BACK PAIN, THORACIC REGION 06/29/2010  . Essential hypertension 05/21/2010  . BACK PAIN 05/21/2010    Past Surgical History:  Procedure Laterality Date  . CERVICAL CONE BIOPSY     cervical tumors  . MYOMECTOMY      OB History    No data available       Home Medications    Prior to Admission medications   Medication Sig Start Date End Date Taking? Authorizing Provider  anastrozole (ARIMIDEX) 1 MG tablet Take 1 mg by mouth daily.   Yes [provider]  Ascorbic Acid (VITAMIN C) 1000 MG tablet Take 3,000 mg by mouth daily.    Yes [provider]  Calcium Carb-Cholecalciferol (CALCIUM 600 + D) 600-200 MG-UNIT TABS Take 1 tablet by mouth daily.   Yes [provider]  Cholecalciferol (VITAMIN D3) 10000 UNITS capsule Take 10,000 Units by mouth daily.   Yes [provider]  Coenzyme Q10 (COQ10) 400 MG CAPS Take 400 mg by mouth daily.  Yes [provider]  DANDELION PO Take 2 capsules by mouth daily.    Yes [provider]  Digestive Enzymes CAPS Take 2 capsules by mouth 3 (three) times daily with meals.   Yes [provider]  ferrous gluconate (FERGON) 325 MG tablet Take 325 mg by mouth daily.     Yes [provider]  GARLIC PO Take 3 capsules by mouth daily.   Yes [provider]  guaiFENesin (ROBITUSSIN) 100 MG/5ML SOLN Take 20 mLs by mouth every 4 (four) hours as needed for cough or to  loosen phlegm.   Yes [provider]  Guaifenesin 1200 MG TB12 Take 1,200 mg by mouth 2 (two) times daily as needed (cough).   Yes [provider]  hydrALAZINE (APRESOLINE) 100 MG tablet Take 100 mg by mouth 2 (two) times daily.    Yes [provider]  hydrochlorothiazide (HYDRODIURIL) 25 MG tablet Take 25 mg by mouth daily with lunch.    Yes [provider]  labetalol (NORMODYNE) 100 MG tablet Take 200 mg by mouth 2 (two) times daily.    Yes [provider]  Lactobacillus (PROBIOTIC ACIDOPHILUS PO) Take 2 tablets by mouth daily.   Yes [provider]  Melatonin 10 MG TABS Take 20 mg by mouth at bedtime as needed (sleep).    Yes [provider]  Multiple Vitamin (MULTIVITAMIN WITH MINERALS) TABS tablet Take 1 tablet by mouth at bedtime.   Yes [provider]  Multiple Vitamins-Iron (CHLORELLA PO) Take 4 g by mouth daily.   Yes [provider]  OVER THE COUNTER MEDICATION Take 2 capsules by mouth daily. Dim Supreme vegetarian capsules (from cruciferous vegetables)   Yes [provider]  Turmeric POWD Take 7.5 mLs by mouth at bedtime.    Yes [provider]    Family History Family History  Problem Relation Age of Onset  . Colon cancer Other        MGM's maternal half sister  . Breast cancer Mother 74       also diagnosed at 4 and 75  . Diabetes Father   . Hypertension Father   . Prostate cancer Father 68  . Diabetes Brother   . Colon cancer Maternal Aunt   . Breast cancer Maternal Aunt 51  . Rectal cancer Maternal Aunt   . Brain cancer Paternal Aunt        dx in her 54s  . Stomach cancer Paternal Uncle 18  . Lung cancer Maternal Grandfather 38  . Tuberculosis Maternal Grandfather   . Tuberculosis Maternal Grandmother   . Diabetes Paternal Grandmother   . Breast cancer Other 8       Maternal great grandmother  . Lung cancer Paternal Uncle 42  . Cancer Paternal Uncle         cancerous tumor on his back  . Breast cancer Cousin        paternal cousin dx in her 49s    Social History Social History   Tobacco Use  . Smoking status: Never Smoker  . Smokeless tobacco: Never Used  Substance Use Topics  . Alcohol use: No  . Drug use: No     Allergies   Other; Shellfish allergy; Erythromycin; Moxifloxacin; Penicillins; and Tequin [gatifloxacin]   Review of Systems Review of Systems  Constitutional: Positive for chills and fever.  HENT: Positive for congestion. Negative for sore throat.   Respiratory: Positive for cough and shortness of breath.   Cardiovascular: Negative  for chest pain.  Gastrointestinal: Positive for abdominal pain, diarrhea, nausea and vomiting. Negative for blood in stool.  Genitourinary: Positive for flank pain, frequency and urgency. Negative for dysuria, hematuria, vaginal bleeding, vaginal discharge and vaginal pain.  All other systems reviewed and are negative.    Physical Exam Updated Vital Signs BP (!) 182/75   Pulse 99   Temp 99.8 F (37.7 C) (Oral)   Resp 18   Ht 5\' 4"  (1.626 m)   Wt 122.5 kg (270 lb)   SpO2 99%   BMI 46.35 kg/m   Physical Exam  Constitutional: She appears well-developed and well-nourished. No distress.  Lightly diaphoretic  HENT:  Head: Normocephalic and atraumatic.  No frontal or maxillary sinus tenderness.  Middle ear effusion bilaterally but no erythema or bulging of the TMs bilaterally.  Posterior oropharynx without tonsillar hypertrophy, erythema, exudates, or uvular deviation.  No trismus or sublingual abnormalities.  Eyes: Conjunctivae and EOM are normal. Pupils are equal, round, and reactive to light. Right eye exhibits no discharge. Left eye exhibits no discharge.  Neck: Normal range of motion. Neck supple. No JVD present. No tracheal deviation present.  No meningeal signs  Cardiovascular: Normal rate, regular rhythm, normal heart sounds and intact distal pulses.  Pulmonary/Chest: Effort  normal. No accessory muscle usage. No tachypnea. No respiratory distress. She has wheezes. She exhibits tenderness.  Diffuse expiratory wheezing noted in the anterior and posterior lung fields, crackles in the right lower lung base posterior  Abdominal: Soft. Bowel sounds are normal. She exhibits no distension. There is tenderness.  Mild generalized abdominal tenderness to palpation with  focal tenderness in the suprapubic region.  Murphy sign absent, Rovsing's absent, no CVA tenderness  Musculoskeletal: Normal range of motion. She exhibits no edema.       Right lower leg: Normal. She exhibits no tenderness and no edema.       Left lower leg: Normal. She exhibits no tenderness and no edema.  Bilateral lateral chest wall tenderness to palpation with no deformity, crepitus, or paradoxical wall motion noted  Lymphadenopathy:    She has no cervical adenopathy.  Neurological: She is alert.  Skin: Skin is warm and dry. No erythema.  Psychiatric: She has a normal mood and affect. Her behavior is normal.  Nursing note and vitals reviewed.    ED Treatments / Results  Labs (all labs ordered are listed, but only abnormal results are displayed) Labs Reviewed  COMPREHENSIVE METABOLIC PANEL - Abnormal; Notable for the following components:      Result Value   Sodium 134 (*)    Chloride 99 (*)    Glucose, Bld 111 (*)    BUN 5 (*)    Calcium 8.8 (*)    All other components within normal limits  CBC - Abnormal; Notable for the following components:   Hemoglobin 8.8 (*)    HCT 29.1 (*)    MCV 73.7 (*)    MCH 22.3 (*)    RDW 18.1 (*)    Platelets 423 (*)    All other components within normal limits  URINALYSIS, ROUTINE W REFLEX MICROSCOPIC - Abnormal; Notable for the following components:   Ketones, ur 5 (*)    Protein, ur 100 (*)    Bacteria, UA RARE (*)    Squamous Epithelial / LPF 0-5 (*)    All other components within normal limits  BRAIN NATRIURETIC PEPTIDE - Abnormal; Notable for the  following components:   B Natriuretic Peptide 101.1 (*)  All other components within normal limits  CULTURE, BLOOD (ROUTINE X 2)  CULTURE, BLOOD (ROUTINE X 2)  CULTURE, EXPECTORATED SPUTUM-ASSESSMENT  GRAM STAIN  LIPASE, BLOOD  INFLUENZA PANEL BY PCR (TYPE A & B)  DIFFERENTIAL  DIFFERENTIAL  HIV ANTIBODY (ROUTINE TESTING)  STREP PNEUMONIAE URINARY ANTIGEN  PROCALCITONIN  VITAMIN B12  FOLATE  IRON AND TIBC  FERRITIN  RETICULOCYTES  CBC WITH DIFFERENTIAL/PLATELET  LEGIONELLA PNEUMOPHILA TOTAL AB  BASIC METABOLIC PANEL  PROCALCITONIN  I-STAT BETA HCG BLOOD, ED ([facility], [facility], [facility] ONLY)  I-STAT TROPONIN, ED  I-STAT CG4 LACTIC ACID, ED  I-STAT CG4 LACTIC ACID, ED    EKG  EKG Interpretation  Date/Time:  Monday August 13 2017 14:12:11 EST Ventricular Rate:  63 PR Interval:  176 QRS Duration: 92 QT Interval:  446 QTC Calculation: 456 R Axis:   13 Text Interpretation:  Sinus rhythm with Premature atrial complexes Otherwise normal ECG no sig change . no acute ischemic changes Confirmed by Charlesetta Shanks [phone]) on 08/13/2017 6:05:07 PM       Radiology Dg Chest 2 View  Result Date: 08/13/2017 CLINICAL DATA:  Congestion, nausea and vomiting cough for the past day. EXAM: CHEST  2 VIEW COMPARISON:  01/14/2017; 03/31/2012 FINDINGS: Grossly unchanged enlarged cardiac silhouette and mediastinal contours. Overall improved aeration of the lungs with persistent bilateral infrahilar opacities favored to represent atelectasis. No new focal airspace opacities. No definite evidence of edema. No pleural effusion or pneumothorax. No acute osseus abnormalities. IMPRESSION: Cardiomegaly and bilateral infrahilar atelectasis without superimposed acute cardiopulmonary disease. Electronically Signed   By: Sandi Mariscal M.D.   On: 08/13/2017 17:46   Ct Angio Chest Pe W And/or Wo Contrast  Result Date: 08/13/2017 CLINICAL DATA:  52 year old with acute onset of cough, shortness of breath and abdominal pain.  Personal history of right breast cancer 5 years ago. EXAM: CT ANGIOGRAPHY CHEST WITH CONTRAST TECHNIQUE: Multidetector CT imaging of the chest was performed using the standard protocol during bolus administration of intravenous contrast. Multiplanar CT image reconstructions and MIPs were obtained to evaluate the vascular anatomy. CONTRAST:  118mL ISOVUE-370 IOPAMIDOL INJECTION 76% IV. COMPARISON:  No prior CT. Multiple prior chest x-rays including earlier same day. FINDINGS: Cardiovascular: Contrast opacification of pulmonary arteries is moderate at best. No filling defects within either main pulmonary artery or their central branches to suggest pulmonary embolism. Heart moderately enlarged. No visible coronary atherosclerosis. No visible atherosclerosis involving the thoracic or upper abdominal aorta. Mediastinum/Nodes: No pathologically enlarged mediastinal, hilar or axillary lymph nodes. No mediastinal masses. Normal-appearing esophagus. Visualized thyroid gland normal in appearance. Lungs/Pleura: Patchy airspace opacities involving the inferior left lower lobe and lingula, the left lower lobe, and to a lesser degree the right lower lobe. No significant airspace disease involving the right upper lobe. No pulmonary parenchymal nodules or masses. Central airways patent with moderate to marked bronchial wall thickening. No pleural effusions. Upper Abdomen: Visualized upper abdomen unremarkable for the early arterial phase of enhancement. Musculoskeletal: Degenerative disc disease and spondylosis involving the upper and midthoracic spine. No acute abnormalities. Review of the MIP images confirms the above findings. Electronically Signed   By: Evangeline Dakin M.D.   On: 08/13/2017 20:45    Procedures Procedures (including critical care time)  Medications Ordered in ED Medications  anastrozole (ARIMIDEX) tablet 1 mg (not administered)  Calcium Carb-Cholecalciferol 600-200 MG-UNIT TABS 1 tablet (not  administered)  guaiFENesin (ROBITUSSIN) 100 MG/5ML solution 400 mg (not administered)  hydrALAZINE (APRESOLINE) tablet 100 mg (not administered)  labetalol (  NORMODYNE) tablet 200 mg (not administered)  multivitamin with minerals tablet 1 tablet (not administered)  famotidine (PEPCID) IVPB 20 mg premix (not administered)  0.9 %  sodium chloride infusion (not administered)  cefTRIAXone (ROCEPHIN) 1 g in dextrose 5 % 50 mL IVPB (not administered)  doxycycline (VIBRA-TABS) tablet 100 mg (not administered)  ondansetron (ZOFRAN-ODT) disintegrating tablet 4 mg (not administered)  promethazine (PHENERGAN) injection 12.5-25 mg (not administered)  acetaminophen (TYLENOL) tablet 650 mg (not administered)  HYDROcodone-acetaminophen (NORCO/VICODIN) 5-325 MG per tablet 1-2 tablet (not administered)  hydrALAZINE (APRESOLINE) injection 10 mg (not administered)  ondansetron (ZOFRAN-ODT) disintegrating tablet 4 mg (4 mg Oral Given 08/13/17 1421)  ondansetron (ZOFRAN-ODT) disintegrating tablet 4 mg (4 mg Oral Given 08/13/17 1559)  sodium chloride 0.9 % bolus 1,000 mL (0 mLs Intravenous Stopped 08/13/17 2324)  ipratropium-albuterol (DUONEB) 0.5-2.5 (3) MG/3ML nebulizer solution 3 mL (3 mLs Nebulization Given 08/13/17 1650)  hydrALAZINE (APRESOLINE) injection 20 mg (20 mg Intravenous Given 08/13/17 1927)  promethazine (PHENERGAN) injection 25 mg (25 mg Intravenous Given 08/13/17 1927)  iopamidol (ISOVUE-370) 76 % injection (100 mLs Intravenous Contrast Given 08/13/17 1946)  cefTRIAXone (ROCEPHIN) 1 g in dextrose 5 % 50 mL IVPB (0 g Intravenous Stopped 08/13/17 2247)  doxycycline (VIBRA-TABS) tablet 100 mg (100 mg Oral Given 08/13/17 2245)  promethazine (PHENERGAN) injection 25 mg (25 mg Intravenous Given 08/13/17 2146)  hydrALAZINE (APRESOLINE) injection 20 mg (20 mg Intravenous Given 08/13/17 2246)     Initial Impression / Assessment and Plan / ED Course  I have reviewed the triage vital signs and the nursing  notes.  Pertinent labs & imaging results that were available during my care of the patient were reviewed by me and considered in my medical decision making (see chart for details).    Patient presents with worsening cough for 4 days, and nausea, vomiting, and diarrhea since last night.  Has not tolerated p.o. Today. Low-grade fever of 100 F and significantly hypertensive initially given that she has not been able to tolerate her blood pressure medications.  She has had significant improvement with administration of hydralazine.Patient is diaphoretic and ill-appearing on initial examination.  No leukocytosis, CMP consistent with vomiting.  Negative lactate, UA is not consistent with UTI.  She has a small amount of RBCs but states that she is currently on her menstrual cycle. BNP very midly elevated.  She has adventitious sounds on auscultation of the lungs and CTA of the chest shows no definitive PE but does show changes consistent with multifocal pneumonia.  Flu test negative. Oxygen saturations dropped to 89% on room air during my examination with improvement while on 3 L of oxygen via nasal cannula.  Given hypoxia, persistent tachypnea, and imaging consistent with community-acquired pneumonia in a patient who is not tolerating p.o., feel admission for further management and evaluation is reasonable.  She was given 2 doses of Zofran and 2 doses of Phenergan with no improvement in her nausea and vomiting.  She was given Rocephin and doxycycline which she tolerated without difficulty in the ED.  Spoke with Dr. Myna Hidalgo with [facility] who agrees to assume care of patient and bring her into the hospital.  Patient seen and evaluated by Dr. Johnney Killian who agrees with assessment and plan at this time. Final Clinical Impressions(s) / ED Diagnoses   Final diagnoses:  Multifocal pneumonia  Intractable cyclical vomiting with nausea    ED Discharge Orders    None       Renita Papa, PA-C 08/14/17 [id number]  Charlesetta Shanks, MD 08/19/17 2315

## 2017-08-13 NOTE — ED Notes (Signed)
Informed Mina - PA of pt's temp.

## 2017-08-14 ENCOUNTER — Encounter (HOSPITAL_COMMUNITY): Payer: Self-pay | Admitting: General Practice

## 2017-08-14 LAB — CBC WITH DIFFERENTIAL/PLATELET
BASOS PCT: 1 %
Basophils Absolute: 0.1 10*3/uL (ref 0.0–0.1)
EOS PCT: 0 %
Eosinophils Absolute: 0 10*3/uL (ref 0.0–0.7)
HEMATOCRIT: 28.1 % — AB (ref 36.0–46.0)
HEMOGLOBIN: 8.5 g/dL — AB (ref 12.0–15.0)
LYMPHS ABS: 0.9 10*3/uL (ref 0.7–4.0)
Lymphocytes Relative: 12 %
MCH: 22 pg — AB (ref 26.0–34.0)
MCHC: 30.2 g/dL (ref 30.0–36.0)
MCV: 72.6 fL — AB (ref 78.0–100.0)
MONOS PCT: 8 %
Monocytes Absolute: 0.6 10*3/uL (ref 0.1–1.0)
NEUTROS ABS: 6.3 10*3/uL (ref 1.7–7.7)
Neutrophils Relative %: 79 %
Platelets: 415 10*3/uL — ABNORMAL HIGH (ref 150–400)
RBC: 3.87 MIL/uL (ref 3.87–5.11)
RDW: 18.3 % — ABNORMAL HIGH (ref 11.5–15.5)
WBC: 7.9 10*3/uL (ref 4.0–10.5)

## 2017-08-14 LAB — VITAMIN B12: Vitamin B-12: 852 pg/mL (ref 180–914)

## 2017-08-14 LAB — BASIC METABOLIC PANEL
Anion gap: 12 (ref 5–15)
BUN: 8 mg/dL (ref 6–20)
CHLORIDE: 100 mmol/L — AB (ref 101–111)
CO2: 23 mmol/L (ref 22–32)
CREATININE: 0.88 mg/dL (ref 0.44–1.00)
Calcium: 8.3 mg/dL — ABNORMAL LOW (ref 8.9–10.3)
GFR calc Af Amer: >60 mL/min (ref >60)
GFR calc non Af Amer: >60 mL/min (ref >60)
GLUCOSE: 127 mg/dL — AB (ref 65–99)
Potassium: 3.2 mmol/L — ABNORMAL LOW (ref 3.5–5.1)
Sodium: 135 mmol/L (ref 135–145)

## 2017-08-14 LAB — HIV ANTIBODY (ROUTINE TESTING W REFLEX): HIV Screen 4th Generation wRfx: NONREACTIVE

## 2017-08-14 LAB — FOLATE: Folate: 11.7 ng/mL (ref >5.9)

## 2017-08-14 LAB — RETICULOCYTES
RBC.: 3.87 MIL/uL (ref 3.87–5.11)
RETIC CT PCT: 1.5 % (ref 0.4–3.1)
Retic Count, Absolute: 58.1 10*3/uL (ref 19.0–186.0)

## 2017-08-14 LAB — IRON AND TIBC
IRON: 18 ug/dL — AB (ref 28–170)
SATURATION RATIOS: 4 % — AB (ref 10.4–31.8)
TIBC: 489 ug/dL — AB (ref 250–450)
UIBC: 471 ug/dL

## 2017-08-14 LAB — PROCALCITONIN: Procalcitonin: <0.1 ng/mL

## 2017-08-14 LAB — FERRITIN: FERRITIN: 11 ng/mL (ref 11–307)

## 2017-08-14 MED ORDER — ONDANSETRON HCL 4 MG/2ML IJ SOLN
4.0000 mg | Freq: Four times a day (QID) | INTRAMUSCULAR | Status: DC | PRN
  Administered 2017-08-14 – 2017-08-15 (×2): 4 mg via INTRAVENOUS
  Filled 2017-08-14 (×2): qty 2

## 2017-08-14 MED ORDER — ONDANSETRON 4 MG PO TBDP: 4.0000 mg | ORAL_TABLET | Freq: Four times a day (QID) | ORAL | Status: DC | PRN

## 2017-08-14 MED ORDER — FERROUS GLUCONATE 324 (38 FE) MG PO TABS
324.0000 mg | ORAL_TABLET | Freq: Every day | ORAL | Status: DC
  Administered 2017-08-14 – 2017-08-16 (×3): 324 mg via ORAL
  Filled 2017-08-14 (×3): qty 1

## 2017-08-14 MED ORDER — FERROUS GLUCONATE 324 (38 FE) MG PO TABS: 325.0000 mg | ORAL_TABLET | Freq: Every day | ORAL | Status: DC

## 2017-08-14 MED ORDER — PROCHLORPERAZINE EDISYLATE 5 MG/ML IJ SOLN
10.0000 mg | Freq: Once | INTRAMUSCULAR | Status: AC
  Administered 2017-08-14: 10 mg via INTRAVENOUS
  Filled 2017-08-14 (×2): qty 2

## 2017-08-14 MED ORDER — HYDROCHLOROTHIAZIDE 25 MG PO TABS
25.0000 mg | ORAL_TABLET | Freq: Every day | ORAL | Status: DC
  Administered 2017-08-14 – 2017-08-18 (×5): 25 mg via ORAL
  Filled 2017-08-14 (×5): qty 1

## 2017-08-14 NOTE — ED Notes (Signed)
Patient tolerating PO intake

## 2017-08-14 NOTE — Progress Notes (Signed)
Pt complaining of nausea unrelieved by IV zofran.  Pt states she had phenergan earlier and that made nausea worse.  Paged provider Tylene Fantasia, who ordered Compazine 10mg  IV.  Will continue to monitor.

## 2017-08-14 NOTE — Progress Notes (Signed)
Dr. Lonny Prude text paged about patients blood pressure.

## 2017-08-14 NOTE — ED Notes (Signed)
Alert, NAD, calm, interactive, resps e/u, speaking in clear complete sentences, no dyspnea noted, skin W&D, VSS, states, "feel better", (denies: pain, sob, nausea, dizziness or visual changes). Transported to F5. Ambulated with steady gait, with assistance, from stretcher to bed.

## 2017-08-14 NOTE — Progress Notes (Signed)
PROGRESS NOTE    Jasmine Stafford  ZCH:[id number] DOB: 1965/09/07 DOA: 08/13/2017 PCP: Cassandria Anger, MD   Brief Narrative: Jasmine Stafford is a 52 y.o. female with medical history significant for breast cancer, hypertension, and iron deficiency anemia. She presented with dyspnea, coughing, nausea/vomiting and fevers/chills. She was found to have community acquired pneumonia. Influenza negative. Started on ceftriaxone and azithromycin.   Assessment & Plan:   Principal Problem:   CAP (community acquired pneumonia) Active Problems:   Essential hypertension   Microcytic anemia   Cancer of upper-outer quadrant of female breast ([place])   Hypertensive urgency   Community acquired pneumonia Subjective improvement overnight. Procalcitonin negative. Afebrile. -Continue Ceftriaxone and doxycycline -Blood cultures pending -Sputum culture, strep pneumo pending  Emesis Unknown etiology. Improving. Influenza negative. -continue antiemetics.  Essential hypertension Hypertensive urgency Patient has not been able to tolerate oral antihypertensives secondary to vomiting. -Continue hydralazine and labetalol -IV hydralazine prn  Microcytic anemia Stable. Iron panel suggests iron deficiency -Continue iron supplementation  Breast cancer -Continue Arimidex. Outpatient oncology follow-up.   DVT prophylaxis: SCDs Code Status: Full code Family Communication: None at bedside Disposition Plan: Home when medically stable   Consultants:   None  Procedures:   None  Antimicrobials:  Ceftriaxone  Doxycycline    Subjective: Mild nausea. Otherwise feels better  Objective: Vitals:   08/14/17 0745 08/14/17 0800 08/14/17 0815 08/14/17 0839  BP: (!) 179/79 (!) 172/84 (!) 181/82 (!) 198/89  Pulse: 74 77 78   Resp:      Temp:      TempSrc:      SpO2: 100% 100% 100%   Weight:      Height:        Intake/Output Summary (Last 24 hours) at 08/14/2017 0851 Last data filed at  08/13/2017 2324 Gross per 24 hour  Intake 1050 ml  Output -  Net 1050 ml   Filed Weights   08/13/17 1411  Weight: 122.5 kg (270 lb)    Examination:  General exam: Appears calm and comfortable Respiratory system: Bilateral rales at bases. Respiratory effort normal. Cardiovascular system: S1 & S2 heard, RRR. No murmurs, rubs, gallops or clicks. Gastrointestinal system: Abdomen is nondistended, soft and nontender. No organomegaly or masses felt. Normal bowel sounds heard. Central nervous system: Alert and oriented. No focal neurological deficits. Extremities: No edema. No calf tenderness Skin: No cyanosis. No rashes Psychiatry: Judgement and insight appear normal. Mood & affect appropriate.     Data Reviewed: I have personally reviewed following labs and imaging studies  CBC: Recent Labs  Lab 08/13/17 1422 08/14/17 0309  WBC 9.5 7.9  NEUTROABS 7.6 6.3  HGB 8.8* 8.5*  HCT 29.1* 28.1*  MCV 73.7* 72.6*  PLT 423* 287*   Basic Metabolic Panel: Recent Labs  Lab 08/13/17 1422 08/14/17 0309  NA 134* 135  K 3.6 3.2*  CL 99* 100*  CO2 22 23  GLUCOSE 111* 127*  BUN 5* 8  CREATININE 0.92 0.88  CALCIUM 8.8* 8.3*   GFR: Estimated Creatinine Clearance: 98.8 mL/min (by C-G formula based on SCr of 0.88 mg/dL). Liver Function Tests: Recent Labs  Lab 08/13/17 1422  AST 38  ALT 29  ALKPHOS 85  BILITOT 0.8  PROT 8.0  ALBUMIN 3.8   Recent Labs  Lab 08/13/17 1422  LIPASE 24   No results for input(s): AMMONIA in the last 168 hours. Coagulation Profile: No results for input(s): INR, PROTIME in the last 168 hours. Cardiac Enzymes: No results for input(s):  CKTOTAL, CKMB, CKMBINDEX, TROPONINI in the last 168 hours. BNP (last 3 results) No results for input(s): PROBNP in the last 8760 hours. HbA1C: No results for input(s): HGBA1C in the last 72 hours. CBG: No results for input(s): GLUCAP in the last 168 hours. Lipid Profile: No results for input(s): CHOL, HDL,  LDLCALC, TRIG, CHOLHDL, LDLDIRECT in the last 72 hours. Thyroid Function Tests: No results for input(s): TSH, T4TOTAL, FREET4, T3FREE, THYROIDAB in the last 72 hours. Anemia Panel: Recent Labs    08/14/17 0309  VITAMINB12 852  FOLATE 11.7  FERRITIN 11  TIBC 489*  IRON 18*  RETICCTPCT 1.5   Sepsis Labs: Recent Labs  Lab 08/13/17 1934 08/13/17 2144 08/14/17 0309  PROCALCITON  --   --  <0.10  LATICACIDVEN 1.63 1.74  --     No results found for this or any previous visit (from the past 240 hour(s)).       Radiology Studies: Dg Chest 2 View  Result Date: 08/13/2017 CLINICAL DATA:  Congestion, nausea and vomiting cough for the past day. EXAM: CHEST  2 VIEW COMPARISON:  01/14/2017; 03/31/2012 FINDINGS: Grossly unchanged enlarged cardiac silhouette and mediastinal contours. Overall improved aeration of the lungs with persistent bilateral infrahilar opacities favored to represent atelectasis. No new focal airspace opacities. No definite evidence of edema. No pleural effusion or pneumothorax. No acute osseus abnormalities. IMPRESSION: Cardiomegaly and bilateral infrahilar atelectasis without superimposed acute cardiopulmonary disease. Electronically Signed   By: Sandi Mariscal M.D.   On: 08/13/2017 17:46   Ct Angio Chest Pe W And/or Wo Contrast  Result Date: 08/13/2017 CLINICAL DATA:  52 year old with acute onset of cough, shortness of breath and abdominal pain. Personal history of right breast cancer 5 years ago. EXAM: CT ANGIOGRAPHY CHEST WITH CONTRAST TECHNIQUE: Multidetector CT imaging of the chest was performed using the standard protocol during bolus administration of intravenous contrast. Multiplanar CT image reconstructions and MIPs were obtained to evaluate the vascular anatomy. CONTRAST:  184mL ISOVUE-370 IOPAMIDOL INJECTION 76% IV. COMPARISON:  No prior CT. Multiple prior chest x-rays including earlier same day. FINDINGS: Cardiovascular: Contrast opacification of pulmonary arteries  is moderate at best. No filling defects within either main pulmonary artery or their central branches to suggest pulmonary embolism. Heart moderately enlarged. No visible coronary atherosclerosis. No visible atherosclerosis involving the thoracic or upper abdominal aorta. Mediastinum/Nodes: No pathologically enlarged mediastinal, hilar or axillary lymph nodes. No mediastinal masses. Normal-appearing esophagus. Visualized thyroid gland normal in appearance. Lungs/Pleura: Patchy airspace opacities involving the inferior left lower lobe and lingula, the left lower lobe, and to a lesser degree the right lower lobe. No significant airspace disease involving the right upper lobe. No pulmonary parenchymal nodules or masses. Central airways patent with moderate to marked bronchial wall thickening. No pleural effusions. Upper Abdomen: Visualized upper abdomen unremarkable for the early arterial phase of enhancement. Musculoskeletal: Degenerative disc disease and spondylosis involving the upper and midthoracic spine. No acute abnormalities. Review of the MIP images confirms the above findings. Electronically Signed   By: Evangeline Dakin M.D.   On: 08/13/2017 20:45        Scheduled Meds: . anastrozole  1 mg Oral Daily  . [START ON 08/15/2017] calcium-vitamin D  1 tablet Oral Daily  . doxycycline  100 mg Oral Q12H  . hydrALAZINE  100 mg Oral BID  . labetalol  200 mg Oral BID  . multivitamin with minerals  1 tablet Oral QHS   Continuous Infusions: . sodium chloride 100 mL/hr at 08/14/17 0845  .  cefTRIAXone (ROCEPHIN)  IV    . famotidine (PEPCID) IV Stopped (08/14/17 0401)     LOS: 1 day     Cordelia Poche, MD [facility] 08/14/2017, 8:51 AM Pager: [phone]  If 7PM-7AM, please contact night-coverage [email or web address] Password [facility] 08/14/2017, 8:51 AM

## 2017-08-14 NOTE — ED Notes (Signed)
Spoke with Admitting MD about patient BP in 180's and going to med Surg bed advise patient does not need Tele at this time.

## 2017-08-15 DIAGNOSIS — J189 Pneumonia, unspecified organism: Principal | ICD-10-CM

## 2017-08-15 LAB — CBC
HEMATOCRIT: 28.8 % — AB (ref 36.0–46.0)
HEMOGLOBIN: 8.6 g/dL — AB (ref 12.0–15.0)
MCH: 21.9 pg — AB (ref 26.0–34.0)
MCHC: 29.9 g/dL — ABNORMAL LOW (ref 30.0–36.0)
MCV: 73.3 fL — ABNORMAL LOW (ref 78.0–100.0)
Platelets: 432 10*3/uL — ABNORMAL HIGH (ref 150–400)
RBC: 3.93 MIL/uL (ref 3.87–5.11)
RDW: 18.3 % — ABNORMAL HIGH (ref 11.5–15.5)
WBC: 8.1 10*3/uL (ref 4.0–10.5)

## 2017-08-15 LAB — BASIC METABOLIC PANEL
Anion gap: 12 (ref 5–15)
BUN: 14 mg/dL (ref 6–20)
CHLORIDE: 98 mmol/L — AB (ref 101–111)
CO2: 26 mmol/L (ref 22–32)
Calcium: 8.5 mg/dL — ABNORMAL LOW (ref 8.9–10.3)
Creatinine, Ser: 1.01 mg/dL — ABNORMAL HIGH (ref 0.44–1.00)
GFR calc non Af Amer: >60 mL/min (ref >60)
Glucose, Bld: 106 mg/dL — ABNORMAL HIGH (ref 65–99)
POTASSIUM: 3.1 mmol/L — AB (ref 3.5–5.1)
SODIUM: 136 mmol/L (ref 135–145)

## 2017-08-15 LAB — PROCALCITONIN: Procalcitonin: <0.1 ng/mL

## 2017-08-15 LAB — LEGIONELLA PNEUMOPHILA TOTAL AB

## 2017-08-15 MED ORDER — SODIUM CHLORIDE 0.9 % IV SOLN
INTRAVENOUS | Status: DC
  Administered 2017-08-15 – 2017-08-16 (×3): via INTRAVENOUS

## 2017-08-15 MED ORDER — ENOXAPARIN SODIUM 40 MG/0.4ML ~~LOC~~ SOLN
40.0000 mg | SUBCUTANEOUS | Status: DC
  Administered 2017-08-17: 40 mg via SUBCUTANEOUS
  Filled 2017-08-15 (×3): qty 0.4

## 2017-08-15 MED ORDER — HYDRALAZINE HCL 20 MG/ML IJ SOLN
10.0000 mg | Freq: Once | INTRAMUSCULAR | Status: AC
  Administered 2017-08-15: 10 mg via INTRAVENOUS
  Filled 2017-08-15: qty 1

## 2017-08-15 MED ORDER — POTASSIUM CHLORIDE CRYS ER 20 MEQ PO TBCR
40.0000 meq | EXTENDED_RELEASE_TABLET | Freq: Once | ORAL | Status: AC
  Administered 2017-08-15: 40 meq via ORAL
  Filled 2017-08-15: qty 2

## 2017-08-15 MED ORDER — FAMOTIDINE 20 MG PO TABS
20.0000 mg | ORAL_TABLET | Freq: Two times a day (BID) | ORAL | Status: DC
  Administered 2017-08-15 – 2017-08-18 (×6): 20 mg via ORAL
  Filled 2017-08-15 (×6): qty 1

## 2017-08-15 MED ORDER — ORAL CARE MOUTH RINSE
15.0000 mL | Freq: Two times a day (BID) | OROMUCOSAL | Status: DC
  Administered 2017-08-15 – 2017-08-18 (×8): 15 mL via OROMUCOSAL

## 2017-08-15 MED ORDER — SENNOSIDES-DOCUSATE SODIUM 8.6-50 MG PO TABS
1.0000 | ORAL_TABLET | Freq: Two times a day (BID) | ORAL | Status: DC
  Administered 2017-08-15 – 2017-08-18 (×6): 1 via ORAL
  Filled 2017-08-15 (×7): qty 1

## 2017-08-15 NOTE — Progress Notes (Signed)
Pt noted to have a manual BP of 190/78, right arm, post administration of 10mg  IV hydralazine at approximately 1910 (this nurse witnessed the day shift nurse administer the med, but there is no record in the [facility]).  Paged provider Tylene Fantasia, who ordered 10mg  hydralazine IV.  Will continue to monitor.

## 2017-08-15 NOTE — Progress Notes (Signed)
PROGRESS NOTE    Jasmine Stafford  BWL:[id number] DOB: [birth year] DOA: 08/13/2017 PCP: Jasmine Anger, MD    Brief Narrative:Jasmine Stafford is a 52 y.o. femalewith medical history significant forbreast cancer, hypertension, and iron deficiency anemia. She presented with dyspnea, coughing, nausea/vomiting and fevers/chills. She was found to have community acquired pneumonia. Influenza negative. Started on ceftriaxone and azithromycin.    Assessment & Plan:   Principal Problem:   CAP (community acquired pneumonia) Active Problems:   Essential hypertension   Microcytic anemia   Cancer of upper-outer quadrant of female breast ([place])   Hypertensive urgency   1-Community acquired PNA;  continue with ceftriaxone and doxy.  Still with nausea and poor oral intake.   2-Nausea; suspect related to acute illness.  PRN zofran.   HTN; Urgency;  Continue with hydralazine, HCTZ, Labetalol.   Microcytic anemia Stable. Iron panel suggests iron deficiency -Continue iron supplementation  Breast cancer -Continue Arimidex. Outpatient oncology follow-up.     DVT prophylaxis: lovenox Code Status: Full code.  Family Communication:care discussed with patient.  Disposition Plan: home when tolerating oral.   Consultants:   none   Procedures: none   Antimicrobials:  ceftriaxone and doxy 1-15   Subjective: She is still nauseous. The medication she received last night made her nausea worse. Not eating.    Objective: Vitals:   08/14/17 1659 08/14/17 2119 08/15/17 0655 08/15/17 0759  BP: (!) 189/76 (!) 166/71 (!) 208/78 (!) 215/89  Pulse:  84 79 85  Resp:  18 18 18   Temp:  98.5 F (36.9 C) 98.9 F (37.2 C) 99.7 F (37.6 C)  TempSrc:  Oral Oral Oral  SpO2:  93% 92% 96%  Weight:      Height:        Intake/Output Summary (Last 24 hours) at 08/15/2017 0852 Last data filed at 08/15/2017 0315 Gross per 24 hour  Intake 200 ml  Output -  Net 200 ml   Filed  Weights   08/13/17 1411 08/14/17 1555  Weight: 122.5 kg (270 lb) 124.6 kg (274 lb 11.1 oz)    Examination:  General exam: Appears calm and comfortable  Respiratory system: bilateral ronchus.  Cardiovascular system: S1 & S2 heard, RRR. No JVD, murmurs, rubs, gallops or clicks. No pedal edema. Gastrointestinal system: Abdomen is nondistended, soft and nontender. No organomegaly or masses felt. Normal bowel sounds heard. Central nervous system: Alert and oriented. No focal neurological deficits. Extremities: Symmetric 5 x 5 power. Skin: No rashes, lesions or ulcers    Data Reviewed: I have personally reviewed following labs and imaging studies  CBC: Recent Labs  Lab 08/13/17 1422 08/14/17 0309 08/15/17 0656  WBC 9.5 7.9 8.1  NEUTROABS 7.6 6.3  --   HGB 8.8* 8.5* 8.6*  HCT 29.1* 28.1* 28.8*  MCV 73.7* 72.6* 73.3*  PLT 423* 415* 681*   Basic Metabolic Panel: Recent Labs  Lab 08/13/17 1422 08/14/17 0309 08/15/17 0503  NA 134* 135 136  K 3.6 3.2* 3.1*  CL 99* 100* 98*  CO2 22 23 26   GLUCOSE 111* 127* 106*  BUN 5* 8 14  CREATININE 0.92 0.88 1.01*  CALCIUM 8.8* 8.3* 8.5*   GFR: Estimated Creatinine Clearance: 87 mL/min (A) (by C-G formula based on SCr of 1.01 mg/dL (H)). Liver Function Tests: Recent Labs  Lab 08/13/17 1422  AST 38  ALT 29  ALKPHOS 85  BILITOT 0.8  PROT 8.0  ALBUMIN 3.8   Recent Labs  Lab 08/13/17 1422  LIPASE 24  No results for input(s): AMMONIA in the last 168 hours. Coagulation Profile: No results for input(s): INR, PROTIME in the last 168 hours. Cardiac Enzymes: No results for input(s): CKTOTAL, CKMB, CKMBINDEX, TROPONINI in the last 168 hours. BNP (last 3 results) No results for input(s): PROBNP in the last 8760 hours. HbA1C: No results for input(s): HGBA1C in the last 72 hours. CBG: No results for input(s): GLUCAP in the last 168 hours. Lipid Profile: No results for input(s): CHOL, HDL, LDLCALC, TRIG, CHOLHDL, LDLDIRECT in the  last 72 hours. Thyroid Function Tests: No results for input(s): TSH, T4TOTAL, FREET4, T3FREE, THYROIDAB in the last 72 hours. Anemia Panel: Recent Labs    08/14/17 0309  VITAMINB12 852  FOLATE 11.7  FERRITIN 11  TIBC 489*  IRON 18*  RETICCTPCT 1.5   Sepsis Labs: Recent Labs  Lab 08/13/17 1934 08/13/17 2144 08/14/17 0309  PROCALCITON  --   --  <0.10  LATICACIDVEN 1.63 1.74  --     No results found for this or any previous visit (from the past 240 hour(s)).       Radiology Studies: Dg Chest 2 View  Result Date: 08/13/2017 CLINICAL DATA:  Congestion, nausea and vomiting cough for the past day. EXAM: CHEST  2 VIEW COMPARISON:  01/14/2017; 03/31/2012 FINDINGS: Grossly unchanged enlarged cardiac silhouette and mediastinal contours. Overall improved aeration of the lungs with persistent bilateral infrahilar opacities favored to represent atelectasis. No new focal airspace opacities. No definite evidence of edema. No pleural effusion or pneumothorax. No acute osseus abnormalities. IMPRESSION: Cardiomegaly and bilateral infrahilar atelectasis without superimposed acute cardiopulmonary disease. Electronically Signed   By: Sandi Mariscal M.D.   On: 08/13/2017 17:46   Ct Angio Chest Pe W And/or Wo Contrast  Result Date: 08/13/2017 CLINICAL DATA:  52 year old with acute onset of cough, shortness of breath and abdominal pain. Personal history of right breast cancer 5 years ago. EXAM: CT ANGIOGRAPHY CHEST WITH CONTRAST TECHNIQUE: Multidetector CT imaging of the chest was performed using the standard protocol during bolus administration of intravenous contrast. Multiplanar CT image reconstructions and MIPs were obtained to evaluate the vascular anatomy. CONTRAST:  119mL ISOVUE-370 IOPAMIDOL INJECTION 76% IV. COMPARISON:  No prior CT. Multiple prior chest x-rays including earlier same day. FINDINGS: Cardiovascular: Contrast opacification of pulmonary arteries is moderate at best. No filling defects  within either main pulmonary artery or their central branches to suggest pulmonary embolism. Heart moderately enlarged. No visible coronary atherosclerosis. No visible atherosclerosis involving the thoracic or upper abdominal aorta. Mediastinum/Nodes: No pathologically enlarged mediastinal, hilar or axillary lymph nodes. No mediastinal masses. Normal-appearing esophagus. Visualized thyroid gland normal in appearance. Lungs/Pleura: Patchy airspace opacities involving the inferior left lower lobe and lingula, the left lower lobe, and to a lesser degree the right lower lobe. No significant airspace disease involving the right upper lobe. No pulmonary parenchymal nodules or masses. Central airways patent with moderate to marked bronchial wall thickening. No pleural effusions. Upper Abdomen: Visualized upper abdomen unremarkable for the early arterial phase of enhancement. Musculoskeletal: Degenerative disc disease and spondylosis involving the upper and midthoracic spine. No acute abnormalities. Review of the MIP images confirms the above findings. Electronically Signed   By: Evangeline Dakin M.D.   On: 08/13/2017 20:45        Scheduled Meds: . anastrozole  1 mg Oral Daily  . calcium-vitamin D  1 tablet Oral Daily  . doxycycline  100 mg Oral Q12H  . ferrous gluconate  324 mg Oral Daily  . hydrALAZINE  100 mg Oral BID  . hydrochlorothiazide  25 mg Oral Q lunch  . labetalol  200 mg Oral BID  . mouth rinse  15 mL Mouth Rinse BID  . multivitamin with minerals  1 tablet Oral QHS  . potassium chloride  40 mEq Oral Once  . senna-docusate  1 tablet Oral BID   Continuous Infusions: . sodium chloride    . cefTRIAXone (ROCEPHIN)  IV Stopped (08/14/17 2346)  . famotidine (PEPCID) IV Stopped (08/14/17 2257)     LOS: 2 days    Time spent: 35 minutes.     Elmarie Shiley, MD [facility] Pager [phone]  If 7PM-7AM, please contact night-coverage [email or web address] Password [facility] 08/15/2017,  8:52 AM

## 2017-08-16 LAB — CBC
HCT: 28.9 % — ABNORMAL LOW (ref 36.0–46.0)
Hemoglobin: 8.5 g/dL — ABNORMAL LOW (ref 12.0–15.0)
MCH: 21.9 pg — AB (ref 26.0–34.0)
MCHC: 29.4 g/dL — ABNORMAL LOW (ref 30.0–36.0)
MCV: 74.3 fL — AB (ref 78.0–100.0)
Platelets: 418 10*3/uL — ABNORMAL HIGH (ref 150–400)
RBC: 3.89 MIL/uL (ref 3.87–5.11)
RDW: 18.6 % — ABNORMAL HIGH (ref 11.5–15.5)
WBC: 8 10*3/uL (ref 4.0–10.5)

## 2017-08-16 LAB — BASIC METABOLIC PANEL
ANION GAP: 12 (ref 5–15)
BUN: 10 mg/dL (ref 6–20)
CHLORIDE: 98 mmol/L — AB (ref 101–111)
CO2: 25 mmol/L (ref 22–32)
Calcium: 8.6 mg/dL — ABNORMAL LOW (ref 8.9–10.3)
Creatinine, Ser: 0.92 mg/dL (ref 0.44–1.00)
GFR calc non Af Amer: >60 mL/min (ref >60)
Glucose, Bld: 117 mg/dL — ABNORMAL HIGH (ref 65–99)
Potassium: 3.4 mmol/L — ABNORMAL LOW (ref 3.5–5.1)
Sodium: 135 mmol/L (ref 135–145)

## 2017-08-16 LAB — EXPECTORATED SPUTUM ASSESSMENT W GRAM STAIN, RFLX TO RESP C

## 2017-08-16 LAB — EXPECTORATED SPUTUM ASSESSMENT W REFEX TO RESP CULTURE

## 2017-08-16 MED ORDER — HYDRALAZINE HCL 50 MG PO TABS
100.0000 mg | ORAL_TABLET | Freq: Three times a day (TID) | ORAL | Status: DC
  Administered 2017-08-16 – 2017-08-18 (×7): 100 mg via ORAL
  Filled 2017-08-16 (×8): qty 2

## 2017-08-16 MED ORDER — AMLODIPINE BESYLATE 5 MG PO TABS
5.0000 mg | ORAL_TABLET | Freq: Every day | ORAL | Status: DC
  Administered 2017-08-16 – 2017-08-18 (×3): 5 mg via ORAL
  Filled 2017-08-16 (×3): qty 1

## 2017-08-16 MED ORDER — POTASSIUM CHLORIDE CRYS ER 20 MEQ PO TBCR
20.0000 meq | EXTENDED_RELEASE_TABLET | Freq: Once | ORAL | Status: AC
  Administered 2017-08-16: 20 meq via ORAL
  Filled 2017-08-16: qty 1

## 2017-08-16 MED ORDER — FERROUS GLUCONATE 324 (38 FE) MG PO TABS
324.0000 mg | ORAL_TABLET | Freq: Two times a day (BID) | ORAL | Status: DC
  Administered 2017-08-16 – 2017-08-18 (×4): 324 mg via ORAL
  Filled 2017-08-16 (×4): qty 1

## 2017-08-16 NOTE — Evaluation (Signed)
Physical Therapy Evaluation Patient Details Name: Jasmine Stafford MRN: [id number] DOB: 12/05/1965 Today's Date: 08/16/2017   History of Present Illness  51yo female c/o fever, chills, coughing, nausea/vomiting at ED and presenting with BP 240/110. Diagnosed with pneumonia, HTN with hypertensive urgency. PMH breast CA, HTN, hx cervical tumors, myomectomy   Clinical Impression   Patient received in bed, pleasant and willing to participate with PT this afternoon; she remains on 2LPM O2 and reports that nursing staff had been concerned about her sats dropping during bathing earlier. She is able to complete all functional bed mobility and transfers with Mod(I) to S, also able to ambulate approximately 150ft with no device and S but fatigued following this distance. MMT reveals generalized BLE weakness in isolated muscle groups. Note O2 sats remained at 97% during gait period. She will benefit from skilled PT services during her hospital stay as well as skilled OP PT services moving forward. RN aware of patient status and mobility. She was left up in chair with visitors present, all needs otherwise met.     Follow Up Recommendations Outpatient PT    Equipment Recommendations  None recommended by PT    Recommendations for Other Services       Precautions / Restrictions Precautions Precautions: None Precaution Comments: watch O2 sats  Restrictions Weight Bearing Restrictions: No      Mobility  Bed Mobility Overal bed mobility: Modified Independent             General bed mobility comments: increased time and effort   Transfers Overall transfer level: Needs assistance Equipment used: None Transfers: Sit to/from Stand Sit to Stand: Supervision         General transfer comment: S for safety   Ambulation/Gait Ambulation/Gait assistance: Supervision Ambulation Distance (Feet): 150 Feet Assistive device: None Gait Pattern/deviations: Step-through pattern;Decreased step length  - right;Decreased step length - left;Trunk flexed     General Gait Details: limited by fatigue and c/o weakess during gait; O2 sats remain at 97% on 2LPM O2   Stairs            Wheelchair Mobility    Modified Rankin (Stroke Patients Only)       Balance Overall balance assessment: No apparent balance deficits (not formally assessed)                                           Pertinent Vitals/Pain Pain Assessment: No/denies pain    Home Living Family/patient expects to be discharged to:: Private residence Living Arrangements: Other relatives Available Help at Discharge: Family Type of Home: House Home Access: Stairs to enter Entrance Stairs-Rails: Right Entrance Stairs-Number of Steps: 4 Home Layout: One level Home Equipment: None      Prior Function Level of Independence: Independent               Hand Dominance   Dominant Hand: Right    Extremity/Trunk Assessment   Upper Extremity Assessment Upper Extremity Assessment: Overall WFL for tasks assessed    Lower Extremity Assessment Lower Extremity Assessment: Generalized weakness    Cervical / Trunk Assessment Cervical / Trunk Assessment: Normal  Communication   Communication: No difficulties  Cognition Arousal/Alertness: Awake/alert Behavior During Therapy: WFL for tasks assessed/performed Overall Cognitive Status: Within Functional Limits for tasks assessed                                          General Comments      Exercises     Assessment/Plan    PT Assessment Patient needs continued PT services  PT Problem List Decreased strength;Decreased mobility;Decreased activity tolerance;Decreased coordination;Obesity       PT Treatment Interventions Therapeutic activities;Gait training;Therapeutic exercise;Patient/family education;Stair training;Balance training;Functional mobility training;Neuromuscular re-education    PT Goals (Current goals can be  found in the Care Plan section)  Acute Rehab PT Goals Patient Stated Goal: to get better/go home  PT Goal Formulation: With patient Time For Goal Achievement: 08/23/17 Potential to Achieve Goals: Good    Frequency Min 3X/week   Barriers to discharge        Co-evaluation               AM-PAC PT "6 Clicks" Daily Activity  Outcome Measure Difficulty turning over in bed (including adjusting bedclothes, sheets and blankets)?: None Difficulty moving from lying on back to sitting on the side of the bed? : None Difficulty sitting down on and standing up from a chair with arms (e.g., wheelchair, bedside commode, etc,.)?: None Help needed moving to and from a bed to chair (including a wheelchair)?: None Help needed walking in hospital room?: None Help needed climbing 3-5 steps with a railing? : A Little 6 Click Score: 23    End of Session Equipment Utilized During Treatment: Gait belt;Oxygen Activity Tolerance: Patient tolerated treatment well;Patient limited by fatigue Patient left: in chair;with family/visitor present;with call bell/phone within reach Nurse Communication: Mobility status;Other (comment)(O2 sats ) PT Visit Diagnosis: Muscle weakness (generalized) (M62.81);Difficulty in walking, not elsewhere classified (R26.2)    Time: 1550-1613 PT Time Calculation (min) (ACUTE ONLY): 23 min   Charges:   PT Evaluation $PT Eval Low Complexity: 1 Low PT Treatments $Gait Training: 8-22 mins   PT G Codes:        Kristen Unger PT, DPT, CBIS  Supplemental Physical Therapist [place]   Pager [phone]    

## 2017-08-16 NOTE — Progress Notes (Signed)
MD notified of pt's bp ranging from 240X(BDZHGD)-924Q systollically. Pt has been given IV hydralazine this morning d/t bp being  194/88 rechecked  & it was 175/69. Per pt she take labetalol, hydralazine & hctz outpt all of which she's on here. Will continue to monitor the pt. Hoover Brunette, RN

## 2017-08-16 NOTE — Progress Notes (Signed)
MD notified of pt's O2 sat being 88% on RA pt not in  Any apparent distress. Pt placed on 2 L  [place] & given her incentive spirometer w/ edu & her O2 sat went up to 97%. Pt's bp also better at 146/69. Will continue to monitor the pt. Hoover Brunette, RN

## 2017-08-16 NOTE — Progress Notes (Signed)
PROGRESS NOTE    Jasmine Stafford  VHQ:[id number] DOB: 04-Jul-1966 DOA: 08/13/2017 PCP: Cassandria Anger, MD    Brief Narrative:Jasmine Stafford is a 52 y.o. femalewith medical history significant forbreast cancer, hypertension, and iron deficiency anemia. She presented with dyspnea, coughing, nausea/vomiting and fevers/chills. She was found to have community acquired pneumonia. Influenza negative. Started on ceftriaxone and azithromycin.    Assessment & Plan:   Principal Problem:   CAP (community acquired pneumonia) Active Problems:   Essential hypertension   Microcytic anemia   Cancer of upper-outer quadrant of female breast ([place])   Hypertensive urgency   1-Community acquired PNA;  continue with ceftriaxone and doxy.  Nausea improved. Poor oral intake.   2-Nausea; suspect related to acute illness.  PRN zofran.   HTN; Urgency;  Continue with hydralazine, HCTZ, Labetalol.  Increase hydralazine and added norvasc.  Monitor BP today on new medications.   Microcytic anemia Stable. Iron panel suggests iron deficiency -Continue iron supplementation  Breast cancer -Continue Arimidex. Outpatient oncology follow-up.     DVT prophylaxis: lovenox Code Status: Full code.  Family Communication:care discussed with patient.  Disposition Plan: home when tolerating oral.   Consultants:   none   Procedures: none   Antimicrobials:  ceftriaxone and doxy 1-15   Subjective: She report cough and dyspnea have improved.  Nausea is better. Poor oral intake but improving.   Objective: Vitals:   08/16/17 0839 08/16/17 0938 08/16/17 1258 08/16/17 1300  BP: (!) 176/76 (!) 176/76 (!) 146/69   Pulse:  77    Resp:      Temp:      TempSrc:      SpO2:   (!) 88% 97%  Weight:      Height:        Intake/Output Summary (Last 24 hours) at 08/16/2017 1304 Last data filed at 08/16/2017 0302 Gross per 24 hour  Intake 1884.67 ml  Output -  Net 1884.67 ml   Filed Weights    08/13/17 1411 08/14/17 1555  Weight: 122.5 kg (270 lb) 124.6 kg (274 lb 11.1 oz)    Examination:  General exam: NAD, appears depressed.  Respiratory system: Normal respiratory effort, bilateral ronchus.  Cardiovascular system: S 1, S 2 RRR Gastrointestinal system: BS present, soft, nt Central nervous system: non focal.  Extremities: no edema.      Data Reviewed: I have personally reviewed following labs and imaging studies  CBC: Recent Labs  Lab 08/13/17 1422 08/14/17 0309 08/15/17 0656 08/16/17 0541  WBC 9.5 7.9 8.1 8.0  NEUTROABS 7.6 6.3  --   --   HGB 8.8* 8.5* 8.6* 8.5*  HCT 29.1* 28.1* 28.8* 28.9*  MCV 73.7* 72.6* 73.3* 74.3*  PLT 423* 415* 432* 413*   Basic Metabolic Panel: Recent Labs  Lab 08/13/17 1422 08/14/17 0309 08/15/17 0503 08/16/17 0541  NA 134* 135 136 135  K 3.6 3.2* 3.1* 3.4*  CL 99* 100* 98* 98*  CO2 22 23 26 25   GLUCOSE 111* 127* 106* 117*  BUN 5* 8 14 10   CREATININE 0.92 0.88 1.01* 0.92  CALCIUM 8.8* 8.3* 8.5* 8.6*   GFR: Estimated Creatinine Clearance: 95.5 mL/min (by C-G formula based on SCr of 0.92 mg/dL). Liver Function Tests: Recent Labs  Lab 08/13/17 1422  AST 38  ALT 29  ALKPHOS 85  BILITOT 0.8  PROT 8.0  ALBUMIN 3.8   Recent Labs  Lab 08/13/17 1422  LIPASE 24   No results for input(s): AMMONIA in the last 168  hours. Coagulation Profile: No results for input(s): INR, PROTIME in the last 168 hours. Cardiac Enzymes: No results for input(s): CKTOTAL, CKMB, CKMBINDEX, TROPONINI in the last 168 hours. BNP (last 3 results) No results for input(s): PROBNP in the last 8760 hours. HbA1C: No results for input(s): HGBA1C in the last 72 hours. CBG: No results for input(s): GLUCAP in the last 168 hours. Lipid Profile: No results for input(s): CHOL, HDL, LDLCALC, TRIG, CHOLHDL, LDLDIRECT in the last 72 hours. Thyroid Function Tests: No results for input(s): TSH, T4TOTAL, FREET4, T3FREE, THYROIDAB in the last 72  hours. Anemia Panel: Recent Labs    08/14/17 0309  VITAMINB12 852  FOLATE 11.7  FERRITIN 11  TIBC 489*  IRON 18*  RETICCTPCT 1.5   Sepsis Labs: Recent Labs  Lab 08/13/17 1934 08/13/17 2144 08/14/17 0309 08/15/17 0503  PROCALCITON  --   --  <0.10 <0.10  LATICACIDVEN 1.63 1.74  --   --     No results found for this or any previous visit (from the past 240 hour(s)).       Radiology Studies: No results found.      Scheduled Meds: . amLODipine  5 mg Oral Daily  . anastrozole  1 mg Oral Daily  . calcium-vitamin D  1 tablet Oral Daily  . doxycycline  100 mg Oral Q12H  . enoxaparin (LOVENOX) injection  40 mg Subcutaneous Q24H  . famotidine  20 mg Oral BID  . ferrous gluconate  324 mg Oral BID WC  . hydrALAZINE  100 mg Oral Q8H  . hydrochlorothiazide  25 mg Oral Q lunch  . labetalol  200 mg Oral BID  . mouth rinse  15 mL Mouth Rinse BID  . multivitamin with minerals  1 tablet Oral QHS  . senna-docusate  1 tablet Oral BID   Continuous Infusions: . cefTRIAXone (ROCEPHIN)  IV Stopped (08/15/17 2316)     LOS: 3 days    Time spent: 35 minutes.     Elmarie Shiley, MD [facility] Pager [phone]  If 7PM-7AM, please contact night-coverage [email or web address] Password [email or web address] 08/16/2017, 1:04 PM
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
## 2017-08-17 ENCOUNTER — Inpatient Hospital Stay (HOSPITAL_COMMUNITY): Payer: 59

## 2017-08-17 LAB — CBC
HCT: 28.4 % — ABNORMAL LOW (ref 36.0–46.0)
Hemoglobin: 8.5 g/dL — ABNORMAL LOW (ref 12.0–15.0)
MCH: 22.1 pg — ABNORMAL LOW (ref 26.0–34.0)
MCHC: 29.9 g/dL — AB (ref 30.0–36.0)
MCV: 74 fL — ABNORMAL LOW (ref 78.0–100.0)
PLATELETS: 431 10*3/uL — AB (ref 150–400)
RBC: 3.84 MIL/uL — ABNORMAL LOW (ref 3.87–5.11)
RDW: 18.4 % — AB (ref 11.5–15.5)
WBC: 9.1 10*3/uL (ref 4.0–10.5)

## 2017-08-17 LAB — ABO/RH: ABO/RH(D): B POS

## 2017-08-17 LAB — PREPARE RBC (CROSSMATCH)

## 2017-08-17 MED ORDER — IPRATROPIUM-ALBUTEROL 0.5-2.5 (3) MG/3ML IN SOLN
3.0000 mL | Freq: Four times a day (QID) | RESPIRATORY_TRACT | Status: DC
  Administered 2017-08-17 (×2): 3 mL via RESPIRATORY_TRACT
  Filled 2017-08-17 (×2): qty 3

## 2017-08-17 MED ORDER — SODIUM CHLORIDE 0.9 % IV SOLN
Freq: Once | INTRAVENOUS | Status: AC
  Administered 2017-08-17: 15:00:00 via INTRAVENOUS

## 2017-08-17 MED ORDER — IPRATROPIUM-ALBUTEROL 0.5-2.5 (3) MG/3ML IN SOLN
3.0000 mL | Freq: Two times a day (BID) | RESPIRATORY_TRACT | Status: DC
  Administered 2017-08-18: 3 mL via RESPIRATORY_TRACT
  Filled 2017-08-17: qty 3

## 2017-08-17 MED ORDER — FUROSEMIDE 10 MG/ML IJ SOLN
20.0000 mg | Freq: Once | INTRAMUSCULAR | Status: AC
  Administered 2017-08-17: 20 mg via INTRAVENOUS
  Filled 2017-08-17: qty 2

## 2017-08-17 MED ORDER — POTASSIUM CHLORIDE CRYS ER 20 MEQ PO TBCR
40.0000 meq | EXTENDED_RELEASE_TABLET | Freq: Once | ORAL | Status: AC
Start: 2017-08-17 — End: 2017-08-17
  Administered 2017-08-17: 40 meq via ORAL
  Filled 2017-08-17: qty 2

## 2017-08-17 NOTE — Evaluation (Signed)
Occupational Therapy Evaluation Patient Details Name: Jasmine Stafford MRN: [id number] DOB: October 11, 1965 Today's Date: 08/17/2017    History of Present Illness 52yo female c/o fever, chills, coughing, nausea/vomiting at ED and presenting with BP 240/110. Diagnosed with pneumonia, HTN with hypertensive urgency. PMH breast CA, HTN, hx cervical tumors, myomectomy    Clinical Impression   Pt overall modified independent to independent for simulated selfcare tasks and functional transfers without assistive device.  Her O2 sats decreased to 88-93% on room air during session with mobility and standing at the sink.  Recommended initial use of shower seat for energy conservation but otherwise no needs.  Pt/family has or will obtain seat for use.  No further OT needs at this time.     Follow Up Recommendations  No OT follow up    Equipment Recommendations  Tub/shower seat(Pt will obtain on her own. )       Precautions / Restrictions Precautions Precautions: None Restrictions Weight Bearing Restrictions: No      Mobility Bed Mobility Overal bed mobility: Independent                Transfers Overall transfer level: Modified independent Equipment used: None Transfers: Sit to/from Stand Sit to Stand: Modified independent (Device/Increase time)         General transfer comment: Pt moving slightly slower than normal secondary to weakness and inactivity, but no LOB or physical assistance needed.     Balance Overall balance assessment: Modified Independent(Pt able to retrieve items from the floor without difficulty placing one UE on a solid surface.  She was also able to step over a  12-15 inch wide marking on the floor without difficulty. )                                         ADL either performed or assessed with clinical judgement   ADL Overall ADL's : Modified independent                                       General ADL Comments: Pt  currently modified independent for selfcare tasks at this time.  Recommend initial use of a shower seat for energy conservation but otherwise no OT needs at this time.  Oxygen sats 88-93% on room air during functional mobility.      Vision Baseline Vision/History: No visual deficits Patient Visual Report: No change from baseline Vision Assessment?: No apparent visual deficits            Pertinent Vitals/Pain Pain Assessment: No/denies pain     Hand Dominance Left   Extremity/Trunk Assessment Upper Extremity Assessment Upper Extremity Assessment: Overall WFL for tasks assessed   Lower Extremity Assessment Lower Extremity Assessment: Defer to PT evaluation   Cervical / Trunk Assessment Cervical / Trunk Assessment: Normal   Communication Communication Communication: No difficulties   Cognition Arousal/Alertness: Awake/alert Behavior During Therapy: WFL for tasks assessed/performed Overall Cognitive Status: Within Functional Limits for tasks assessed                                                Home Living Family/patient expects to be discharged to:: Private residence Living Arrangements:  Other relatives Available Help at Discharge: Family Type of Home: House Home Access: Stairs to enter Technical brewer of Steps: 4 Entrance Stairs-Rails: Right Home Layout: One level     Bathroom Shower/Tub: Teacher, early years/pre: Standard     Home Equipment: None          Prior Functioning/Environment Level of Independence: Independent                                           AM-PAC PT "6 Clicks" Daily Activity     Outcome Measure Help from another person eating meals?: None Help from another person taking care of personal grooming?: None Help from another person toileting, which includes using toliet, bedpan, or urinal?: None Help from another person bathing (including washing, rinsing, drying)?: None Help from  another person to put on and taking off regular upper body clothing?: None Help from another person to put on and taking off regular lower body clothing?: None 6 Click Score: 24   End of Session Nurse Communication: Mobility status  Activity Tolerance: Patient tolerated treatment well Patient left: in chair;with call bell/phone within reach;with family/visitor present                   Time: 1751-0258 OT Time Calculation (min): 28 min Charges:  OT General Charges $OT Visit: 1 Visit OT Evaluation $OT Eval Low Complexity: 1 Low OT Treatments $Self Care/Home Management : 8-22 mins   Danzel Marszalek OTR/L 08/17/2017, 9:34 AM

## 2017-08-17 NOTE — Care Management (Signed)
CM requested [facility] to review pt per insurance requirements  for home oxygen

## 2017-08-17 NOTE — Progress Notes (Signed)
Physical Therapy Treatment Patient Details Name: Jasmine Stafford MRN: [id number] DOB: June 29, 1966 Today's Date: 08/17/2017    History of Present Illness 52yo female c/o fever, chills, coughing, nausea/vomiting at ED and presenting with BP 240/110. Diagnosed with pneumonia, HTN with hypertensive urgency. PMH breast CA, HTN, hx cervical tumors, myomectomy     PT Comments    Pt met coming out of bathroom and reports being very fatigued and requests not walking. Pt returned to seated EoB and was able to participate in LE therex to maintain strength. Pt currently mod I for bed mobility, supervision for stand>sit and ambulation of 8 feet without AD. D/c plans continue to remain appropriate at this time. PT will continue to follow acutely.     Follow Up Recommendations  Outpatient PT     Equipment Recommendations  None recommended by PT    Recommendations for Other Services       Precautions / Restrictions Precautions Precautions: None Precaution Comments: watch O2 sats  Restrictions Weight Bearing Restrictions: No    Mobility  Bed Mobility Overal bed mobility: Modified Independent             General bed mobility comments: increased time and effort   Transfers Overall transfer level: Needs assistance Equipment used: None Transfers: Sit to/from Stand Sit to Stand: Supervision         General transfer comment: S for safety   Ambulation/Gait Ambulation/Gait assistance: Supervision Ambulation Distance (Feet): 8 Feet Assistive device: None Gait Pattern/deviations: Step-through pattern;Decreased step length - right;Decreased step length - left;Trunk flexed Gait velocity: slowed Gait velocity interpretation: Below normal speed for age/gender General Gait Details: limited by fatigue and c/o weakess during gait; O2 sats remain at 97% on 2LPM O2       Balance Overall balance assessment: No apparent balance deficits (not formally assessed)                                           Cognition Arousal/Alertness: Awake/alert Behavior During Therapy: WFL for tasks assessed/performed Overall Cognitive Status: Within Functional Limits for tasks assessed                                        Exercises General Exercises - Lower Extremity Quad Sets: PROM;Both;10 reps;Supine Gluteal Sets: AROM;Both;10 reps;Supine Long Arc Quad: AROM;Both;10 reps;Seated Hip ABduction/ADduction: AROM;10 reps;Both;Seated Hip Flexion/Marching: AROM;Both;10 reps;Seated Toe Raises: AROM;Both;10 reps;Seated Heel Raises: AROM;Both;10 reps;Seated    General Comments General comments (skin integrity, edema, etc.): SaO2 on 2L of O2 via nasal cannula 97%O2      Pertinent Vitals/Pain Pain Assessment: Faces Faces Pain Scale: Hurts little more Pain Location: chest with coughing Pain Descriptors / Indicators: Grimacing Pain Intervention(s): Limited activity within patient's tolerance;Monitored during session;Repositioned           PT Goals (current goals can now be found in the care plan section) Acute Rehab PT Goals Patient Stated Goal: to get better/go home  PT Goal Formulation: With patient Time For Goal Achievement: 08/23/17 Potential to Achieve Goals: Good Progress towards PT goals: Not progressing toward goals - comment    Frequency    Min 3X/week      PT Plan Current plan remains appropriate       AM-PAC PT "6 Clicks" Daily Activity  Outcome Measure  Difficulty turning over in bed (including adjusting bedclothes, sheets and blankets)?: None Difficulty moving from lying on back to sitting on the side of the bed? : None Difficulty sitting down on and standing up from a chair with arms (e.g., wheelchair, bedside commode, etc,.)?: None Help needed moving to and from a bed to chair (including a wheelchair)?: None Help needed walking in hospital room?: None Help needed climbing 3-5 steps with a railing? : A Little 6 Click  Score: 23    End of Session Equipment Utilized During Treatment: Gait belt;Oxygen Activity Tolerance: Patient tolerated treatment well;Patient limited by fatigue Patient left: in chair;with family/visitor present;with call bell/phone within reach Nurse Communication: Mobility status;Other (comment)(O2 sats ) PT Visit Diagnosis: Muscle weakness (generalized) (M62.81);Difficulty in walking, not elsewhere classified (R26.2)     Time: 3354-5625 PT Time Calculation (min) (ACUTE ONLY): 19 min  Charges:  $Therapeutic Exercise: 8-22 mins                    G Codes:       Shelbee Apgar B. Migdalia Dk PT, DPT Acute Rehabilitation  [phone] Pager [phone]     [place] 08/17/2017, 5:01 PM

## 2017-08-17 NOTE — Progress Notes (Signed)
PT Cancellation Note  Patient Details Name: Jasmine Stafford MRN: [id number] DOB: Sep 03, 1965   Cancelled Treatment:    Reason Eval/Treat Not Completed: Fatigue/lethargy limiting ability to participate. Pt reports she is feeling worn out and unable to participate in therapy at this time. She is currently in bed after sitting up this AM, participating in OT and taking her bath. She states she has started her menstrual cycle and the heavy flow is making her even more tired. PT to re-attempt as time allows.    Lorriane Shire 08/17/2017, 12:02 PM

## 2017-08-17 NOTE — Progress Notes (Signed)
PROGRESS NOTE    Jasmine Stafford  UTM:[id number] DOB: 1966-02-19 DOA: 08/13/2017 PCP: Cassandria Anger, MD    Brief Narrative:Jasmine Stafford is a 52 y.o. femalewith medical history significant forbreast cancer, hypertension, and iron deficiency anemia. She presented with dyspnea, coughing, nausea/vomiting and fevers/chills. She was found to have community acquired pneumonia. Influenza negative. Started on ceftriaxone and azithromycin.    Assessment & Plan:   Principal Problem:   CAP (community acquired pneumonia) Active Problems:   Essential hypertension   Microcytic anemia   Cancer of upper-outer quadrant of female breast ([place])   Hypertensive urgency   1-Community acquired PNA;  continue with ceftriaxone and doxy.  Nausea improved.  Repeat chest x ray due to dyspnea.  patient with bilateral wheezing, will start nebulizer.   2-Nausea; suspect related to acute illness.  PRN zofran.   HTN; Urgency;  Continue with hydralazine, HCTZ, Labetalol.  Continue with hydralazine and Norvasc.    Microcytic anemia Stable. Iron panel suggests iron deficiency -Continue iron supplementation transfuse one unit PRBC, symptomatic anemia.   Breast cancer -Continue Arimidex. Outpatient oncology follow-up.     DVT prophylaxis: lovenox Code Status: Full code.  Family Communication:care discussed with patient.  Disposition Plan: home in 24 hours if respiratory status improved.   Consultants:   none   Procedures: none   Antimicrobials:  ceftriaxone and doxy 1-15   Subjective: She is eating better. Report Worsening dyspnea, worse on exertion. Report wheezing.   Objective: Vitals:   08/17/17 0605 08/17/17 0830 08/17/17 0900 08/17/17 1320  BP: (!) 177/77  (!) 144/75 (!) 166/87  Pulse: 79 74  72  Resp: 18   19  Temp: 99.5 F (37.5 C)   98.6 F (37 C)  TempSrc: Oral   Oral  SpO2: 98% 91% 96% 97%  Weight:      Height:        Intake/Output Summary (Last 24  hours) at 08/17/2017 1353 Last data filed at 08/17/2017 5681 Gross per 24 hour  Intake 240 ml  Output -  Net 240 ml   Filed Weights   08/13/17 1411 08/14/17 1555  Weight: 122.5 kg (270 lb) 124.6 kg (274 lb 11.1 oz)    Examination:  General exam: NAD Respiratory system: Normal respiratory effort, bilateral wheezing.  Cardiovascular system: S 1, S 2 RRR Gastrointestinal system: BS present, soft, nt Central nervous system: non focal.  Extremities: no edema     Data Reviewed: I have personally reviewed following labs and imaging studies  CBC: Recent Labs  Lab 08/13/17 1422 08/14/17 0309 08/15/17 0656 08/16/17 0541 08/17/17 0941  WBC 9.5 7.9 8.1 8.0 9.1  NEUTROABS 7.6 6.3  --   --   --   HGB 8.8* 8.5* 8.6* 8.5* 8.5*  HCT 29.1* 28.1* 28.8* 28.9* 28.4*  MCV 73.7* 72.6* 73.3* 74.3* 74.0*  PLT 423* 415* 432* 418* 275*   Basic Metabolic Panel: Recent Labs  Lab 08/13/17 1422 08/14/17 0309 08/15/17 0503 08/16/17 0541  NA 134* 135 136 135  K 3.6 3.2* 3.1* 3.4*  CL 99* 100* 98* 98*  CO2 22 23 26 25   GLUCOSE 111* 127* 106* 117*  BUN 5* 8 14 10   CREATININE 0.92 0.88 1.01* 0.92  CALCIUM 8.8* 8.3* 8.5* 8.6*   GFR: Estimated Creatinine Clearance: 95.5 mL/min (by C-G formula based on SCr of 0.92 mg/dL). Liver Function Tests: Recent Labs  Lab 08/13/17 1422  AST 38  ALT 29  ALKPHOS 85  BILITOT 0.8  PROT 8.0  ALBUMIN 3.8   Recent Labs  Lab 08/13/17 1422  LIPASE 24   No results for input(s): AMMONIA in the last 168 hours. Coagulation Profile: No results for input(s): INR, PROTIME in the last 168 hours. Cardiac Enzymes: No results for input(s): CKTOTAL, CKMB, CKMBINDEX, TROPONINI in the last 168 hours. BNP (last 3 results) No results for input(s): PROBNP in the last 8760 hours. HbA1C: No results for input(s): HGBA1C in the last 72 hours. CBG: No results for input(s): GLUCAP in the last 168 hours. Lipid Profile: No results for input(s): CHOL, HDL, LDLCALC,  TRIG, CHOLHDL, LDLDIRECT in the last 72 hours. Thyroid Function Tests: No results for input(s): TSH, T4TOTAL, FREET4, T3FREE, THYROIDAB in the last 72 hours. Anemia Panel: No results for input(s): VITAMINB12, FOLATE, FERRITIN, TIBC, IRON, RETICCTPCT in the last 72 hours. Sepsis Labs: Recent Labs  Lab 08/13/17 1934 08/13/17 2144 08/14/17 0309 08/15/17 0503  PROCALCITON  --   --  <0.10 <0.10  LATICACIDVEN 1.63 1.74  --   --     Recent Results (from the past 240 hour(s))  Culture, blood (routine x 2)     Status: None (Preliminary result)   Collection Time: 08/13/17  7:24 PM  Result Value Ref Range Status   Specimen Description BLOOD RIGHT ANTECUBITAL  Final   Special Requests   Final    BOTTLES DRAWN AEROBIC AND ANAEROBIC Blood Culture adequate volume   Culture NO GROWTH 4 DAYS  Final   Report Status PENDING  Incomplete  Culture, expectorated sputum-assessment     Status: None   Collection Time: 08/15/17 10:00 PM  Result Value Ref Range Status   Specimen Description EXPECTORATED SPUTUM  Final   Special Requests NONE  Final   Sputum evaluation THIS SPECIMEN IS ACCEPTABLE FOR SPUTUM CULTURE  Final   Report Status 08/16/2017 FINAL  Final  Culture, respiratory (NON-Expectorated)     Status: None (Preliminary result)   Collection Time: 08/15/17 10:00 PM  Result Value Ref Range Status   Specimen Description EXPECTORATED SPUTUM  Final   Special Requests NONE Reflexed from [id number]  Final   Gram Stain   Final    FEW WBC PRESENT,BOTH PMN AND MONONUCLEAR MODERATE GRAM POSITIVE RODS FEW GRAM POSITIVE COCCI    Culture CULTURE REINCUBATED FOR BETTER GROWTH  Final   Report Status PENDING  Incomplete         Radiology Studies: Dg Chest Port 1 View  Result Date: 08/17/2017 CLINICAL DATA:  Shortness of Breath EXAM: PORTABLE CHEST 1 VIEW COMPARISON:  Chest radiograph and chest CT August 13, 2017 FINDINGS: There is no edema or consolidation. Heart is mildly enlarged with pulmonary  vascularity within normal limits. No adenopathy. No pneumothorax. No bone lesions. IMPRESSION: Mild cardiomegaly.  No edema or consolidation. Electronically Signed   By: Lowella Grip III M.D.   On: 08/17/2017 10:27        Scheduled Meds: . amLODipine  5 mg Oral Daily  . anastrozole  1 mg Oral Daily  . calcium-vitamin D  1 tablet Oral Daily  . doxycycline  100 mg Oral Q12H  . enoxaparin (LOVENOX) injection  40 mg Subcutaneous Q24H  . famotidine  20 mg Oral BID  . ferrous gluconate  324 mg Oral BID WC  . furosemide  20 mg Intravenous Once  . hydrALAZINE  100 mg Oral Q8H  . hydrochlorothiazide  25 mg Oral Q lunch  . ipratropium-albuterol  3 mL Nebulization Q6H  . labetalol  200 mg Oral BID  .  mouth rinse  15 mL Mouth Rinse BID  . multivitamin with minerals  1 tablet Oral QHS  . senna-docusate  1 tablet Oral BID   Continuous Infusions: . sodium chloride    . cefTRIAXone (ROCEPHIN)  IV Stopped (08/16/17 2336)     LOS: 4 days    Time spent: 35 minutes.     Elmarie Shiley, MD [facility] Pager [phone]  If 7PM-7AM, please contact night-coverage [email or web address] Password [email or web address] 08/17/2017, 1:53 PM

## 2017-08-17 NOTE — Progress Notes (Signed)
SATURATION QUALIFICATIONS: (This note is used to comply with regulatory documentation for home oxygen)  Patient Saturations on Room Air at Rest = 93%  Patient Saturations on Room Air while Ambulating = 88%  Patient Saturations on 2 Liters of oxygen while Ambulating = 97%  Please briefly explain why patient needs home oxygen:

## 2017-08-18 LAB — CULTURE, RESPIRATORY W GRAM STAIN: Culture: NORMAL

## 2017-08-18 LAB — CBC
HCT: 28.7 % — ABNORMAL LOW (ref 36.0–46.0)
Hemoglobin: 8.6 g/dL — ABNORMAL LOW (ref 12.0–15.0)
MCH: 22.2 pg — ABNORMAL LOW (ref 26.0–34.0)
MCHC: 30 g/dL (ref 30.0–36.0)
MCV: 74 fL — ABNORMAL LOW (ref 78.0–100.0)
PLATELETS: 379 10*3/uL (ref 150–400)
RBC: 3.88 MIL/uL (ref 3.87–5.11)
RDW: 17.8 % — ABNORMAL HIGH (ref 11.5–15.5)
WBC: 10.4 10*3/uL (ref 4.0–10.5)

## 2017-08-18 LAB — BPAM RBC
Blood Product Expiration Date: 201901252359
ISSUE DATE / TIME: 201901181624
Unit Type and Rh: 7300

## 2017-08-18 LAB — CULTURE, RESPIRATORY

## 2017-08-18 LAB — CULTURE, BLOOD (ROUTINE X 2)
Culture: NO GROWTH
Culture: NO GROWTH
SPECIAL REQUESTS: ADEQUATE
Special Requests: ADEQUATE

## 2017-08-18 LAB — TYPE AND SCREEN
ABO/RH(D): B POS
ANTIBODY SCREEN: NEGATIVE
Unit division: 0

## 2017-08-18 MED ORDER — FERROUS GLUCONATE 325 MG PO TABS: 325.0000 mg | ORAL_TABLET | Freq: Three times a day (TID) | ORAL | 0 refills | Status: AC

## 2017-08-18 MED ORDER — DOXYCYCLINE HYCLATE 100 MG PO TABS: 100.0000 mg | ORAL_TABLET | Freq: Two times a day (BID) | ORAL | 0 refills | Status: DC

## 2017-08-18 MED ORDER — AMLODIPINE BESYLATE 5 MG PO TABS: 5.0000 mg | ORAL_TABLET | Freq: Every day | ORAL | 0 refills | Status: DC

## 2017-08-18 MED ORDER — SENNOSIDES-DOCUSATE SODIUM 8.6-50 MG PO TABS: 1.0000 | ORAL_TABLET | Freq: Two times a day (BID) | ORAL | 0 refills | Status: DC

## 2017-08-18 MED ORDER — IPRATROPIUM-ALBUTEROL 0.5-2.5 (3) MG/3ML IN SOLN: 3.0000 mL | Freq: Two times a day (BID) | RESPIRATORY_TRACT | 0 refills | Status: AC

## 2017-08-18 MED ORDER — GUAIFENESIN 100 MG/5ML PO SOLN: 20.0000 mL | ORAL | 0 refills | Status: DC | PRN

## 2017-08-18 NOTE — Progress Notes (Signed)
Pt given discharge instructions, prescriptions, and care notes. Pt verbalized understanding AEB no further questions or concerns at this time. IV was discontinued, no redness, pain, or swelling noted at this time. Telemetry discontinued and Centralized Telemetry was notified. Pt to leave the floor via wheelchair with staff in stable condition. 

## 2017-08-18 NOTE — Care Management (Signed)
Jermaine with [facility] made aware of pt discharge, nebulizer, and O2 needs.  Pt will be staying with family at [facility] in [place] for a few days and O2 should be delivered there today.  Outpt PT referral placed.  Pt states she will have transportation.

## 2017-08-18 NOTE — Discharge Summary (Signed)
Physician Discharge Summary  AISLYN HAYSE IRS:[id number] DOB: April 10, 1966 DOA: 08/13/2017  PCP: Cassandria Anger, MD  Admit date: 08/13/2017 Discharge date: 08/18/2017  Admitted From: home  Disposition: home   Recommendations for Outpatient Follow-up:  1. Follow up with PCP in 1-2 weeks 2. Please obtain BMP/CBC in one week 3. Needs cbc, adjustment of BP medications.   Home Health: yes  Discharge Condition: stable.  CODE STATUS; full code.  Diet recommendation: Heart Healthy   Brief/Interim Summary:  Brief Narrative:Tarae D Barnesis a 52 y.o.femalewith medical history significant forbreast cancer, hypertension, and iron deficiency anemia. She presented with dyspnea, coughing, nausea/vomiting and fevers/chills. She was found to have community acquired pneumonia. Influenza negative. Started on ceftriaxone and azithromycin.    Assessment & Plan:   Principal Problem:   CAP (community acquired pneumonia) Active Problems:   Essential hypertension   Microcytic anemia   Cancer of upper-outer quadrant of female breast ([place])   Hypertensive urgency   1-Community acquired PNA;  continue with ceftriaxone and doxy day 5 antibiotics. Discharge on doxy 2 more days.  Nausea improved.  Chest x ray stable.  Dyspnea, improved with nebulizer.  Bronchospasm; improved with nebulizer.   2-Nausea; suspect related to acute illness.  PRN zofran.   HTN; Urgency;  Continue with hydralazine, HCTZ, Labetalol.  Continue with hydralazine and Norvasc.    Microcytic anemia Stable. Iron panel suggests iron deficiency -Continue iron supplementation transfuse one unit PRBC, symptomatic anemia.  hb stable, dyspnea improved.   Breast cancer -Continue Arimidex. Outpatient oncology follow-up.        Discharge Diagnoses:  Principal Problem:   CAP (community acquired pneumonia) Active Problems:   Essential hypertension   Microcytic anemia   Cancer of upper-outer  quadrant of female breast ([place])   Hypertensive urgency    Discharge Instructions  Discharge Instructions    Diet - low sodium heart healthy   Complete by:  As directed    Increase activity slowly   Complete by:  As directed      Allergies as of 08/18/2017      Reactions   Other Hives   Reaction to Tomatoes & Mushrooms   Shellfish Allergy Hives   Erythromycin Hives   Moxifloxacin Hives   Penicillins Hives   Has patient had a PCN reaction causing immediate rash, facial/tongue/throat swelling, SOB or lightheadedness with hypotension: Yes Has patient had a PCN reaction causing severe rash involving mucus membranes or skin necrosis: No Has patient had a PCN reaction that required hospitalization: No Has patient had a PCN reaction occurring within the last 10 years: No If all of the above answers are "NO", then may proceed with Cephalosporin use.   Tequin [gatifloxacin] Hives      Medication List    TAKE these medications   amLODipine 5 MG tablet Commonly known as:  NORVASC Take 1 tablet (5 mg total) by mouth daily. Start taking on:  08/19/2017   anastrozole 1 MG tablet Commonly known as:  ARIMIDEX Take 1 mg by mouth daily.   CALCIUM 600 + D 600-200 MG-UNIT Tabs Generic drug:  Calcium Carb-Cholecalciferol Take 1 tablet by mouth daily.   CHLORELLA PO Take 4 g by mouth daily.   CoQ10 400 MG Caps Take 400 mg by mouth daily.   DANDELION PO Take 2 capsules by mouth daily.   Digestive Enzymes Caps Take 2 capsules by mouth 3 (three) times daily with meals.   doxycycline 100 MG tablet Commonly known as:  VIBRA-TABS Take 1  tablet (100 mg total) by mouth every 12 (twelve) hours.   ferrous gluconate 325 MG tablet Commonly known as:  FERGON Take 1 tablet (325 mg total) by mouth 3 (three) times daily with meals. What changed:  when to take this   GARLIC PO Take 3 capsules by mouth daily.   Guaifenesin 1200 MG Tb12 Take 1,200 mg by mouth 2 (two) times daily as needed  (cough).   guaiFENesin 100 MG/5ML Soln Commonly known as:  ROBITUSSIN Take 20 mLs (400 mg total) by mouth every 4 (four) hours as needed for cough or to loosen phlegm.   hydrALAZINE 100 MG tablet Commonly known as:  APRESOLINE Take 100 mg by mouth 2 (two) times daily.   hydrochlorothiazide 25 MG tablet Commonly known as:  HYDRODIURIL Take 25 mg by mouth daily with lunch.   ipratropium-albuterol 0.5-2.5 (3) MG/3ML Soln Commonly known as:  DUONEB Take 3 mLs by nebulization 2 (two) times daily.   labetalol 100 MG tablet Commonly known as:  NORMODYNE Take 200 mg by mouth 2 (two) times daily.   Melatonin 10 MG Tabs Take 20 mg by mouth at bedtime as needed (sleep).   multivitamin with minerals Tabs tablet Take 1 tablet by mouth at bedtime.   OVER THE COUNTER MEDICATION Take 2 capsules by mouth daily. Dim Supreme vegetarian capsules (from cruciferous vegetables)   PROBIOTIC ACIDOPHILUS PO Take 2 tablets by mouth daily.   senna-docusate 8.6-50 MG tablet Commonly known as:  Senokot-S Take 1 tablet by mouth 2 (two) times daily.   Turmeric Powd Take 7.5 mLs by mouth at bedtime.   vitamin C 1000 MG tablet Take 3,000 mg by mouth daily.   Vitamin D3 10000 units capsule Take 10,000 Units by mouth daily.            Durable Medical Equipment  (From admission, onward)        Start     Ordered   08/18/17 1036  For home use only DME Nebulizer machine  Once    Question:  Patient needs a nebulizer to treat with the following condition  Answer:  Bronchospasm, acute   08/18/17 1035   08/17/17 1422  For home use only DME oxygen  Once    Question Answer Comment  Mode or (Route) Nasal cannula   Liters per Minute 2   Frequency Continuous (stationary and portable oxygen unit needed)   Oxygen delivery system Gas      08/17/17 1421      Allergies  Allergen Reactions  . Other Hives    Reaction to Tomatoes & Mushrooms  . Shellfish Allergy Hives  . Erythromycin Hives  .  Moxifloxacin Hives  . Penicillins Hives    Has patient had a PCN reaction causing immediate rash, facial/tongue/throat swelling, SOB or lightheadedness with hypotension: Yes Has patient had a PCN reaction causing severe rash involving mucus membranes or skin necrosis: No Has patient had a PCN reaction that required hospitalization: No Has patient had a PCN reaction occurring within the last 10 years: No If all of the above answers are "NO", then may proceed with Cephalosporin use.  Flo Shanks [Gatifloxacin] Hives    Consultations:  none   Procedures/Studies: Dg Chest 2 View  Result Date: 08/13/2017 CLINICAL DATA:  Congestion, nausea and vomiting cough for the past day. EXAM: CHEST  2 VIEW COMPARISON:  01/14/2017; 03/31/2012 FINDINGS: Grossly unchanged enlarged cardiac silhouette and mediastinal contours. Overall improved aeration of the lungs with persistent bilateral infrahilar opacities favored to  represent atelectasis. No new focal airspace opacities. No definite evidence of edema. No pleural effusion or pneumothorax. No acute osseus abnormalities. IMPRESSION: Cardiomegaly and bilateral infrahilar atelectasis without superimposed acute cardiopulmonary disease. Electronically Signed   By: Sandi Mariscal M.D.   On: 08/13/2017 17:46   Ct Angio Chest Pe W And/or Wo Contrast  Result Date: 08/13/2017 CLINICAL DATA:  52 year old with acute onset of cough, shortness of breath and abdominal pain. Personal history of right breast cancer 5 years ago. EXAM: CT ANGIOGRAPHY CHEST WITH CONTRAST TECHNIQUE: Multidetector CT imaging of the chest was performed using the standard protocol during bolus administration of intravenous contrast. Multiplanar CT image reconstructions and MIPs were obtained to evaluate the vascular anatomy. CONTRAST:  175mL ISOVUE-370 IOPAMIDOL INJECTION 76% IV. COMPARISON:  No prior CT. Multiple prior chest x-rays including earlier same day. FINDINGS: Cardiovascular: Contrast opacification  of pulmonary arteries is moderate at best. No filling defects within either main pulmonary artery or their central branches to suggest pulmonary embolism. Heart moderately enlarged. No visible coronary atherosclerosis. No visible atherosclerosis involving the thoracic or upper abdominal aorta. Mediastinum/Nodes: No pathologically enlarged mediastinal, hilar or axillary lymph nodes. No mediastinal masses. Normal-appearing esophagus. Visualized thyroid gland normal in appearance. Lungs/Pleura: Patchy airspace opacities involving the inferior left lower lobe and lingula, the left lower lobe, and to a lesser degree the right lower lobe. No significant airspace disease involving the right upper lobe. No pulmonary parenchymal nodules or masses. Central airways patent with moderate to marked bronchial wall thickening. No pleural effusions. Upper Abdomen: Visualized upper abdomen unremarkable for the early arterial phase of enhancement. Musculoskeletal: Degenerative disc disease and spondylosis involving the upper and midthoracic spine. No acute abnormalities. Review of the MIP images confirms the above findings. Electronically Signed   By: Evangeline Dakin M.D.   On: 08/13/2017 20:45   Dg Chest Port 1 View  Result Date: 08/17/2017 CLINICAL DATA:  Shortness of Breath EXAM: PORTABLE CHEST 1 VIEW COMPARISON:  Chest radiograph and chest CT August 13, 2017 FINDINGS: There is no edema or consolidation. Heart is mildly enlarged with pulmonary vascularity within normal limits. No adenopathy. No pneumothorax. No bone lesions. IMPRESSION: Mild cardiomegaly.  No edema or consolidation. Electronically Signed   By: Lowella Grip III M.D.   On: 08/17/2017 10:27        Subjective: She is feeling better. Dyspnea improved.  She has a lot going on. Her husband might need hospice.   Discharge Exam: Vitals:   08/18/17 0539 08/18/17 0931  BP: (!) 113/46   Pulse: 68   Resp:    Temp: 98.5 F (36.9 C)   SpO2: 98% 99%    Vitals:   08/17/17 2023 08/17/17 2205 08/18/17 0539 08/18/17 0931  BP:  (!) 165/69 (!) 113/46   Pulse:  79 68   Resp:  17    Temp:  99.1 F (37.3 C) 98.5 F (36.9 C)   TempSrc:   Oral   SpO2: 99% 100% 98% 99%  Weight:      Height:        General: Pt is alert, awake, not in acute distress Cardiovascular: RRR, S1/S2 +, no rubs, no gallops Respiratory: CTA bilaterally, no wheezing, no rhonchi Abdominal: Soft, NT, ND, bowel sounds + Extremities: no edema, no cyanosis    The results of significant diagnostics from this hospitalization (including imaging, microbiology, ancillary and laboratory) are listed below for reference.     Microbiology: Recent Results (from the past 240 hour(s))  Culture, blood (routine  x 2)     Status: None (Preliminary result)   Collection Time: 08/13/17  7:23 PM  Result Value Ref Range Status   Specimen Description BLOOD LEFT HAND  Final   Special Requests   Final    BOTTLES DRAWN AEROBIC ONLY Blood Culture adequate volume   Culture NO GROWTH 4 DAYS  Final   Report Status PENDING  Incomplete  Culture, blood (routine x 2)     Status: None (Preliminary result)   Collection Time: 08/13/17  7:24 PM  Result Value Ref Range Status   Specimen Description BLOOD RIGHT ANTECUBITAL  Final   Special Requests   Final    BOTTLES DRAWN AEROBIC AND ANAEROBIC Blood Culture adequate volume   Culture NO GROWTH 4 DAYS  Final   Report Status PENDING  Incomplete  Culture, expectorated sputum-assessment     Status: None   Collection Time: 08/15/17 10:00 PM  Result Value Ref Range Status   Specimen Description EXPECTORATED SPUTUM  Final   Special Requests NONE  Final   Sputum evaluation THIS SPECIMEN IS ACCEPTABLE FOR SPUTUM CULTURE  Final   Report Status 08/16/2017 FINAL  Final  Culture, respiratory (NON-Expectorated)     Status: None (Preliminary result)   Collection Time: 08/15/17 10:00 PM  Result Value Ref Range Status   Specimen Description EXPECTORATED  SPUTUM  Final   Special Requests NONE Reflexed from [id number]  Final   Gram Stain   Final    FEW WBC PRESENT,BOTH PMN AND MONONUCLEAR MODERATE GRAM POSITIVE RODS FEW GRAM POSITIVE COCCI    Culture CULTURE REINCUBATED FOR BETTER GROWTH  Final   Report Status PENDING  Incomplete     Labs: BNP (last 3 results) Recent Labs    08/13/17 1914  BNP 277.4*   Basic Metabolic Panel: Recent Labs  Lab 08/13/17 1422 08/14/17 0309 08/15/17 0503 08/16/17 0541  NA 134* 135 136 135  K 3.6 3.2* 3.1* 3.4*  CL 99* 100* 98* 98*  CO2 22 23 26 25   GLUCOSE 111* 127* 106* 117*  BUN 5* 8 14 10   CREATININE 0.92 0.88 1.01* 0.92  CALCIUM 8.8* 8.3* 8.5* 8.6*   Liver Function Tests: Recent Labs  Lab 08/13/17 1422  AST 38  ALT 29  ALKPHOS 85  BILITOT 0.8  PROT 8.0  ALBUMIN 3.8   Recent Labs  Lab 08/13/17 1422  LIPASE 24   No results for input(s): AMMONIA in the last 168 hours. CBC: Recent Labs  Lab 08/13/17 1422 08/14/17 0309 08/15/17 0656 08/16/17 0541 08/17/17 0941 08/18/17 0639  WBC 9.5 7.9 8.1 8.0 9.1 10.4  NEUTROABS 7.6 6.3  --   --   --   --   HGB 8.8* 8.5* 8.6* 8.5* 8.5* 8.6*  HCT 29.1* 28.1* 28.8* 28.9* 28.4* 28.7*  MCV 73.7* 72.6* 73.3* 74.3* 74.0* 74.0*  PLT 423* 415* 432* 418* 431* 379   Cardiac Enzymes: No results for input(s): CKTOTAL, CKMB, CKMBINDEX, TROPONINI in the last 168 hours. BNP: Invalid input(s): POCBNP CBG: No results for input(s): GLUCAP in the last 168 hours. D-Dimer No results for input(s): DDIMER in the last 72 hours. Hgb A1c No results for input(s): HGBA1C in the last 72 hours. Lipid Profile No results for input(s): CHOL, HDL, LDLCALC, TRIG, CHOLHDL, LDLDIRECT in the last 72 hours. Thyroid function studies No results for input(s): TSH, T4TOTAL, T3FREE, THYROIDAB in the last 72 hours.  Invalid input(s): FREET3 Anemia work up No results for input(s): VITAMINB12, FOLATE, FERRITIN, TIBC, IRON, RETICCTPCT in the  last 72 hours. Urinalysis     Component Value Date/Time   COLORURINE YELLOW 08/13/2017 1518   APPEARANCEUR CLEAR 08/13/2017 1518   LABSPEC 1.016 08/13/2017 1518   PHURINE 8.0 08/13/2017 1518   GLUCOSEU NEGATIVE 08/13/2017 1518   HGBUR NEGATIVE 08/13/2017 1518   BILIRUBINUR NEGATIVE 08/13/2017 1518   KETONESUR 5 (A) 08/13/2017 1518   PROTEINUR 100 (A) 08/13/2017 1518   UROBILINOGEN 0.2 07/28/2014 1901   NITRITE NEGATIVE 08/13/2017 1518   LEUKOCYTESUR NEGATIVE 08/13/2017 1518   Sepsis Labs Invalid input(s): PROCALCITONIN,  WBC,  LACTICIDVEN Microbiology Recent Results (from the past 240 hour(s))  Culture, blood (routine x 2)     Status: None (Preliminary result)   Collection Time: 08/13/17  7:23 PM  Result Value Ref Range Status   Specimen Description BLOOD LEFT HAND  Final   Special Requests   Final    BOTTLES DRAWN AEROBIC ONLY Blood Culture adequate volume   Culture NO GROWTH 4 DAYS  Final   Report Status PENDING  Incomplete  Culture, blood (routine x 2)     Status: None (Preliminary result)   Collection Time: 08/13/17  7:24 PM  Result Value Ref Range Status   Specimen Description BLOOD RIGHT ANTECUBITAL  Final   Special Requests   Final    BOTTLES DRAWN AEROBIC AND ANAEROBIC Blood Culture adequate volume   Culture NO GROWTH 4 DAYS  Final   Report Status PENDING  Incomplete  Culture, expectorated sputum-assessment     Status: None   Collection Time: 08/15/17 10:00 PM  Result Value Ref Range Status   Specimen Description EXPECTORATED SPUTUM  Final   Special Requests NONE  Final   Sputum evaluation THIS SPECIMEN IS ACCEPTABLE FOR SPUTUM CULTURE  Final   Report Status 08/16/2017 FINAL  Final  Culture, respiratory (NON-Expectorated)     Status: None (Preliminary result)   Collection Time: 08/15/17 10:00 PM  Result Value Ref Range Status   Specimen Description EXPECTORATED SPUTUM  Final   Special Requests NONE Reflexed from [id number]  Final   Gram Stain   Final    FEW WBC PRESENT,BOTH PMN AND  MONONUCLEAR MODERATE GRAM POSITIVE RODS FEW GRAM POSITIVE COCCI    Culture CULTURE REINCUBATED FOR BETTER GROWTH  Final   Report Status PENDING  Incomplete     Time coordinating discharge: Over 30 minutes  SIGNED:   Elmarie Shiley, MD  [facility] 08/18/2017, 10:35 AM Pager   If 7PM-7AM, please contact night-coverage [email or web address] Password [email or web address]

## 2017-08-20 DIAGNOSIS — J069 Acute upper respiratory infection, unspecified: Secondary | ICD-10-CM | POA: Diagnosis not present

## 2017-08-20 DIAGNOSIS — G4733 Obstructive sleep apnea (adult) (pediatric): Secondary | ICD-10-CM | POA: Diagnosis not present

## 2017-08-20 DIAGNOSIS — J189 Pneumonia, unspecified organism: Secondary | ICD-10-CM | POA: Diagnosis not present

## 2017-08-24 ENCOUNTER — Ambulatory Visit: Payer: 59 | Admitting: Internal Medicine

## 2017-08-27 ENCOUNTER — Encounter: Payer: Self-pay | Admitting: Internal Medicine

## 2017-08-27 ENCOUNTER — Ambulatory Visit: Payer: 59 | Admitting: Internal Medicine

## 2017-08-27 ENCOUNTER — Telehealth: Payer: Self-pay | Admitting: Internal Medicine

## 2017-08-27 VITALS — BP 124/62 | HR 72 | Temp 99.0°F | Ht 64.0 in | Wt 268.0 lb

## 2017-08-27 DIAGNOSIS — Z23 Encounter for immunization: Secondary | ICD-10-CM | POA: Diagnosis not present

## 2017-08-27 DIAGNOSIS — I10 Essential (primary) hypertension: Secondary | ICD-10-CM | POA: Diagnosis not present

## 2017-08-27 DIAGNOSIS — C50411 Malignant neoplasm of upper-outer quadrant of right female breast: Secondary | ICD-10-CM

## 2017-08-27 DIAGNOSIS — D509 Iron deficiency anemia, unspecified: Secondary | ICD-10-CM | POA: Diagnosis not present

## 2017-08-27 DIAGNOSIS — J189 Pneumonia, unspecified organism: Secondary | ICD-10-CM | POA: Diagnosis not present

## 2017-08-27 MED ORDER — ANASTROZOLE 1 MG PO TABS: 1.0000 mg | ORAL_TABLET | Freq: Every day | ORAL | 5 refills | Status: DC

## 2017-08-27 NOTE — Progress Notes (Signed)
Subjective:  Patient ID: Jasmine Stafford, female    DOB: [birth year]  Age: 52 y.o. MRN: [id number]  CC: No chief complaint on file.   HPI Jasmine Stafford presents for a new pt visist to re-establish and to f/u on CAP and anemia s/p hosp stay   F/u anemia w/fibroids - heavy bleeding H/o breast Ca She had a blood transfusion   F/u CT chest: Lungs/Pleura: Patchy airspace opacities involving the inferior left lower lobe and lingula, the left lower lobe, and to a lesser degree the right lower lobe. No significant airspace disease involving the right upper lobe. No pulmonary parenchymal nodules or masses. Central airways patent with moderate to marked bronchial wall thickening. No pleural effusions  Past Medical History:  Diagnosis Date  . Anemia   . Breast cancer ([employer])   . Genital warts   . History of chicken pox   . HTN (hypertension)   . Status post blood transfusion without reported diagnosis 1998, 2002, 2005 x2   Past Surgical History:  Procedure Laterality Date  . CERVICAL CONE BIOPSY     cervical tumors  . MYOMECTOMY      reports that  has never smoked. she has never used smokeless tobacco. She reports that she does not drink alcohol or use drugs. family history includes Brain cancer in her paternal aunt; Breast cancer in her cousin; Breast cancer (age of onset: 81) in her mother and other; Breast cancer (age of onset: 2) in her maternal aunt; Cancer in her paternal uncle; Colon cancer in her maternal aunt and other; Diabetes in her brother, father, and paternal grandmother; Hypertension in her father; Lung cancer (age of onset: 89) in her paternal uncle; Lung cancer (age of onset: 45) in her maternal grandfather; Prostate cancer (age of onset: 69) in her father; Rectal cancer in her maternal aunt; Stomach cancer (age of onset: 26) in her paternal uncle; Tuberculosis in her maternal grandfather and maternal grandmother. Allergies  Allergen Reactions  . Other Hives   Reaction to Tomatoes & Mushrooms  . Shellfish Allergy Hives  . Erythromycin Hives  . Moxifloxacin Hives  . Penicillins Hives    Has patient had a PCN reaction causing immediate rash, facial/tongue/throat swelling, SOB or lightheadedness with hypotension: Yes Has patient had a PCN reaction causing severe rash involving mucus membranes or skin necrosis: No Has patient had a PCN reaction that required hospitalization: No Has patient had a PCN reaction occurring within the last 10 years: No If all of the above answers are "NO", then may proceed with Cephalosporin use.  Jasmine Stafford [Gatifloxacin] Hives     Outpatient Medications Prior to Visit  Medication Sig Dispense Refill  . anastrozole (ARIMIDEX) 1 MG tablet Take 1 mg by mouth daily.    . Ascorbic Acid (VITAMIN C) 1000 MG tablet Take 3,000 mg by mouth daily.     . Calcium Carb-Cholecalciferol (CALCIUM 600 + D) 600-200 MG-UNIT TABS Take 1 tablet by mouth daily.    . Cholecalciferol (VITAMIN D3) 10000 UNITS capsule Take 10,000 Units by mouth daily.    . Coenzyme Q10 (COQ10) 400 MG CAPS Take 400 mg by mouth daily.    Marland Kitchen DANDELION PO Take 2 capsules by mouth daily.     . Digestive Enzymes CAPS Take 2 capsules by mouth 3 (three) times daily with meals.    Marland Kitchen doxycycline (VIBRA-TABS) 100 MG tablet Take 1 tablet (100 mg total) by mouth every 12 (twelve) hours. 4 tablet 0  . ferrous  gluconate (FERGON) 325 MG tablet Take 1 tablet (325 mg total) by mouth 3 (three) times daily with meals. 30 tablet 0  . GARLIC PO Take 3 capsules by mouth daily.    Marland Kitchen guaiFENesin (ROBITUSSIN) 100 MG/5ML SOLN Take 20 mLs (400 mg total) by mouth every 4 (four) hours as needed for cough or to loosen phlegm. 1200 mL 0  . Guaifenesin 1200 MG TB12 Take 1,200 mg by mouth 2 (two) times daily as needed (cough).    . hydrALAZINE (APRESOLINE) 100 MG tablet Take 100 mg by mouth 2 (two) times daily.     . hydrochlorothiazide (HYDRODIURIL) 25 MG tablet Take 25 mg by mouth daily with  lunch.     Marland Kitchen ipratropium-albuterol (DUONEB) 0.5-2.5 (3) MG/3ML SOLN Take 3 mLs by nebulization 2 (two) times daily. 360 mL 0  . labetalol (NORMODYNE) 300 MG tablet Take 600 mg by mouth 2 (two) times daily.     . Lactobacillus (PROBIOTIC ACIDOPHILUS PO) Take 2 tablets by mouth daily.    . Melatonin 10 MG TABS Take 20 mg by mouth at bedtime as needed (sleep).     . Multiple Vitamin (MULTIVITAMIN WITH MINERALS) TABS tablet Take 1 tablet by mouth at bedtime.    . Multiple Vitamins-Iron (CHLORELLA PO) Take 4 g by mouth daily.    Marland Kitchen OVER THE COUNTER MEDICATION Take 2 capsules by mouth daily. Dim Supreme vegetarian capsules (from cruciferous vegetables)    . senna-docusate (SENOKOT-S) 8.6-50 MG tablet Take 1 tablet by mouth 2 (two) times daily. 10 tablet 0  . Turmeric POWD Take 7.5 mLs by mouth at bedtime.     Marland Kitchen amLODipine (NORVASC) 5 MG tablet Take 1 tablet (5 mg total) by mouth daily. 30 tablet 0   No facility-administered medications prior to visit.     ROS Review of Systems  Constitutional: Positive for fatigue. Negative for activity change, appetite change, chills and unexpected weight change.  HENT: Negative for congestion, mouth sores and sinus pressure.   Eyes: Negative for visual disturbance.  Respiratory: Negative for cough and chest tightness.   Gastrointestinal: Negative for abdominal pain and nausea.  Genitourinary: Negative for difficulty urinating, frequency and vaginal pain.  Musculoskeletal: Negative for back pain and gait problem.  Skin: Negative for pallor and rash.  Neurological: Negative for dizziness, tremors, weakness, numbness and headaches.  Psychiatric/Behavioral: Negative for confusion, sleep disturbance and suicidal ideas.    Objective:  BP 124/62 (BP Location: Left Arm, Patient Position: Sitting, Cuff Size: Large)   Pulse 72   Temp 99 F (37.2 C) (Oral)   Ht 5\' 4"  (1.626 m)   Wt 268 lb (121.6 kg)   LMP 08/13/2017   SpO2 98%   BMI 46.00 kg/m   BP Readings  from Last 3 Encounters:  08/27/17 124/62  08/18/17 (!) 113/46  01/14/17 (!) 216/91    Wt Readings from Last 3 Encounters:  08/27/17 268 lb (121.6 kg)  08/14/17 274 lb 11.1 oz (124.6 kg)  01/14/17 260 lb (117.9 kg)    Physical Exam  Constitutional: She appears well-developed. No distress.  HENT:  Head: Normocephalic.  Right Ear: External ear normal.  Left Ear: External ear normal.  Nose: Nose normal.  Mouth/Throat: Oropharynx is clear and moist.  Eyes: Conjunctivae are normal. Pupils are equal, round, and reactive to light. Right eye exhibits no discharge. Left eye exhibits no discharge.  Neck: Normal range of motion. Neck supple. No JVD present. No tracheal deviation present. No thyromegaly present.  Cardiovascular: Normal  rate, regular rhythm and normal heart sounds.  Pulmonary/Chest: No stridor. No respiratory distress. She has no wheezes.  Abdominal: Soft. Bowel sounds are normal. She exhibits no distension and no mass. There is no tenderness. There is no rebound and no guarding.  Musculoskeletal: She exhibits no edema or tenderness.  Lymphadenopathy:    She has no cervical adenopathy.  Neurological: She displays normal reflexes. No cranial nerve deficit. She exhibits normal muscle tone. Coordination normal.  Skin: No rash noted. No erythema.  Psychiatric: She has a normal mood and affect. Her behavior is normal. Judgment and thought content normal.  obese  Lab Results  Component Value Date   WBC 10.4 08/18/2017   HGB 8.6 (L) 08/18/2017   HCT 28.7 (L) 08/18/2017   PLT 379 08/18/2017   GLUCOSE 117 (H) 08/16/2017   ALT 29 08/13/2017   AST 38 08/13/2017   NA 135 08/16/2017   K 3.4 (L) 08/16/2017   CL 98 (L) 08/16/2017   CREATININE 0.92 08/16/2017   BUN 10 08/16/2017   CO2 25 08/16/2017    Dg Chest 2 View  Result Date: 08/13/2017 CLINICAL DATA:  Congestion, nausea and vomiting cough for the past day. EXAM: CHEST  2 VIEW COMPARISON:  01/14/2017; 03/31/2012 FINDINGS:  Grossly unchanged enlarged cardiac silhouette and mediastinal contours. Overall improved aeration of the lungs with persistent bilateral infrahilar opacities favored to represent atelectasis. No new focal airspace opacities. No definite evidence of edema. No pleural effusion or pneumothorax. No acute osseus abnormalities. IMPRESSION: Cardiomegaly and bilateral infrahilar atelectasis without superimposed acute cardiopulmonary disease. Electronically Signed   By: Sandi Mariscal M.D.   On: 08/13/2017 17:46   Ct Angio Chest Pe W And/or Wo Contrast  Result Date: 08/13/2017 CLINICAL DATA:  52 year old with acute onset of cough, shortness of breath and abdominal pain. Personal history of right breast cancer 5 years ago. EXAM: CT ANGIOGRAPHY CHEST WITH CONTRAST TECHNIQUE: Multidetector CT imaging of the chest was performed using the standard protocol during bolus administration of intravenous contrast. Multiplanar CT image reconstructions and MIPs were obtained to evaluate the vascular anatomy. CONTRAST:  119mL ISOVUE-370 IOPAMIDOL INJECTION 76% IV. COMPARISON:  No prior CT. Multiple prior chest x-rays including earlier same day. FINDINGS: Cardiovascular: Contrast opacification of pulmonary arteries is moderate at best. No filling defects within either main pulmonary artery or their central branches to suggest pulmonary embolism. Heart moderately enlarged. No visible coronary atherosclerosis. No visible atherosclerosis involving the thoracic or upper abdominal aorta. Mediastinum/Nodes: No pathologically enlarged mediastinal, hilar or axillary lymph nodes. No mediastinal masses. Normal-appearing esophagus. Visualized thyroid gland normal in appearance. Lungs/Pleura: Patchy airspace opacities involving the inferior left lower lobe and lingula, the left lower lobe, and to a lesser degree the right lower lobe. No significant airspace disease involving the right upper lobe. No pulmonary parenchymal nodules or masses. Central  airways patent with moderate to marked bronchial wall thickening. No pleural effusions. Upper Abdomen: Visualized upper abdomen unremarkable for the early arterial phase of enhancement. Musculoskeletal: Degenerative disc disease and spondylosis involving the upper and midthoracic spine. No acute abnormalities. Review of the MIP images confirms the above findings. Electronically Signed   By: Evangeline Dakin M.D.   On: 08/13/2017 20:45    Assessment & Plan:   There are no diagnoses linked to this encounter. I have discontinued Ever Halberg. [place]'s amLODipine. I am also having her maintain her hydrALAZINE, labetalol, hydrochlorothiazide, Multiple Vitamins-Iron (CHLORELLA PO), DANDELION PO, anastrozole, Calcium Carb-Cholecalciferol, Vitamin D3, OVER THE COUNTER MEDICATION, Melatonin, vitamin  C, GARLIC PO, Digestive Enzymes, Lactobacillus (PROBIOTIC ACIDOPHILUS PO), Turmeric, CoQ10, multivitamin with minerals, Guaifenesin, doxycycline, ferrous gluconate, guaiFENesin, ipratropium-albuterol, and senna-docusate.  No orders of the defined types were placed in this encounter.    Follow-up: No Follow-up on file.  Walker Kehr, MD

## 2017-08-27 NOTE — Assessment & Plan Note (Addendum)
Arimidex  Oncology ref

## 2017-08-27 NOTE — Assessment & Plan Note (Signed)
BP Readings from Last 3 Encounters:  08/27/17 124/62  08/18/17 (!) 113/46  01/14/17 (!) 216/91  Amlodipine, HCTZ, Hydralazine, Labetalol

## 2017-08-27 NOTE — Telephone Encounter (Signed)
Copied from [place] [id number]. Topic: General - Other >> Aug 27, 2017  4:48 PM Lolita Rieger, [place] wrote: Reason for CRM: pt called and stated that she needs a note to return to work please call pt [phone] Fax to [phone] attn of Pati Gallo

## 2017-08-27 NOTE — Assessment & Plan Note (Signed)
Hosp stay reviewed 

## 2017-08-27 NOTE — Assessment & Plan Note (Signed)
Severe due to vaginal bleed- On Iron po Fibroids discussed - GYN appt pending Pt refused hysterectomy

## 2017-08-28 NOTE — Telephone Encounter (Signed)
letter faxed

## 2017-09-03 ENCOUNTER — Encounter: Payer: Self-pay | Admitting: Hematology and Oncology

## 2017-09-03 ENCOUNTER — Telehealth: Payer: Self-pay | Admitting: Hematology and Oncology

## 2017-09-03 NOTE — Telephone Encounter (Signed)
Pt has been scheduled to see D. Gudena on 2/27 at 345pm. An earlier date and time has been offered to the pt, but was unable to schedule due to work. Letter mailed.

## 2017-09-18 ENCOUNTER — Other Ambulatory Visit: Payer: Self-pay

## 2017-09-18 DIAGNOSIS — R5381 Other malaise: Secondary | ICD-10-CM

## 2017-09-26 ENCOUNTER — Inpatient Hospital Stay: Payer: 59 | Attending: Hematology and Oncology | Admitting: Hematology and Oncology

## 2017-09-26 ENCOUNTER — Other Ambulatory Visit: Payer: Self-pay | Admitting: Hematology and Oncology

## 2017-09-26 DIAGNOSIS — Z170 Estrogen receptor positive status [ER+]: Secondary | ICD-10-CM | POA: Diagnosis not present

## 2017-09-26 DIAGNOSIS — Z79811 Long term (current) use of aromatase inhibitors: Secondary | ICD-10-CM | POA: Diagnosis not present

## 2017-09-26 DIAGNOSIS — C50411 Malignant neoplasm of upper-outer quadrant of right female breast: Secondary | ICD-10-CM | POA: Diagnosis not present

## 2017-09-26 DIAGNOSIS — D509 Iron deficiency anemia, unspecified: Secondary | ICD-10-CM | POA: Diagnosis not present

## 2017-09-26 MED ORDER — ANASTROZOLE 1 MG PO TABS: 1.0000 mg | ORAL_TABLET | Freq: Every day | ORAL | 3 refills | Status: DC

## 2017-09-26 NOTE — Assessment & Plan Note (Signed)
Right breast invasive lobular cancer grade 1-2, ER 99%, PR 99%, HER-2 negative Ratio 1.3, Ki-67 13%, initial MRI revealed 7.1 cm area of mass or non-mass enhancement. Current treatment: Zoladex last given August 2017, anastrozole 1 mg daily Patient never had surgery because she does not wish to undergo mastectomy.  Recommendation: 1.  I reviewed her entire folder of information and it took more than an hour and half to review it. 2. we will continue with Zoladex and anastrozole.  I recommended the monthly Zoladex. 3.  We will obtain a mammogram and a breast MRI. 4.  Return to clinic after these imaging studies to discuss the treatment plan.

## 2017-09-26 NOTE — Progress Notes (Signed)
[facility] CONSULT NOTE  Patient Care Team: Plotnikov, Evie Lacks, MD as PCP - General  CHIEF COMPLAINTS/PURPOSE OF CONSULTATION:  Prior history of breast cancer and chronic iron deficiency anemia  HISTORY OF PRESENTING ILLNESS:  Jasmine Stafford 52 y.o. female is here because of a previous diagnosis of right breast invasive lobular cancer.  She was recommended surgery but she went to cancer treatment centers of [place] and was informed that she could be treated naturally with Zoladex and anastrozole along with herbs.  She has been on this treatment since 2014.  She wanted to moved to [facility] for her oncology care but they refused to refer her out.  The last injection of Zoladex that she received was in August 2016.  She however continued with anastrozole therapy even though she kept having regular menstrual cycles without the additional ovarian suppression.  She was primarily referred to [place] for iron deficiency anemia but also to assist her with her breast cancer care. She is still strongly believes that she does not want to undergo a mastectomy.  She may be willing to undergo a lumpectomy if it was possible.  I reviewed her records extensively and collaborated the history with the patient.  SUMMARY OF ONCOLOGIC HISTORY:   Cancer of upper-outer quadrant of female breast ([place])   08/09/2012 Initial Diagnosis    Right breast biopsy 10 o'clock position: Invasive lobular cancer, ALH, grade 1-2, ER 99%, PR 99%, Ki-67 13%, HER-2 negative ratio 1.3      09/26/2012 -  Anti-estrogen oral therapy    Zoladex with anastrozole prescribed at cancer treatment centers of [place] along with lots of herbs.  Annual mammograms and MRIs showed stable findings.  Patient never had surgery      MEDICAL HISTORY:  Past Medical History:  Diagnosis Date  . Anemia   . Breast cancer ([place])   . Genital warts   . History of chicken pox   . HTN (hypertension)   . Status post blood transfusion  without reported diagnosis 1998, 2002, 2005 x2    SURGICAL HISTORY: Past Surgical History:  Procedure Laterality Date  . CERVICAL CONE BIOPSY     cervical tumors  . MYOMECTOMY      SOCIAL HISTORY: Social History   Socioeconomic History  . Marital status: Single    Spouse name: Not on file  . Number of children: Not on file  . Years of education: Not on file  . Highest education level: Not on file  Social Needs  . Financial resource strain: Not on file  . Food insecurity - worry: Not on file  . Food insecurity - inability: Not on file  . Transportation needs - medical: Not on file  . Transportation needs - non-medical: Not on file  Occupational History  . Occupation: Scientist, research (medical): [employer]  Tobacco Use  . Smoking status: Never Smoker  . Smokeless tobacco: Never Used  Substance and Sexual Activity  . Alcohol use: No  . Drug use: No  . Sexual activity: Yes    Birth control/protection: None  Other Topics Concern  . Not on file  Social History Narrative   Lives alone    FAMILY HISTORY: Family History  Problem Relation Age of Onset  . Colon cancer Other        MGM's maternal half sister  . Breast cancer Mother 56       also diagnosed at 46 and 65  . Diabetes Father   .  Hypertension Father   . Prostate cancer Father 53  . Diabetes Brother   . Colon cancer Maternal Aunt   . Breast cancer Maternal Aunt 51  . Rectal cancer Maternal Aunt   . Brain cancer Paternal Aunt        dx in her 41s  . Stomach cancer Paternal Uncle 21  . Lung cancer Maternal Grandfather 52  . Tuberculosis Maternal Grandfather   . Tuberculosis Maternal Grandmother   . Diabetes Paternal Grandmother   . Breast cancer Other 5       Maternal great grandmother  . Lung cancer Paternal Uncle 58  . Cancer Paternal Uncle        cancerous tumor on his back  . Breast cancer Cousin        paternal cousin dx in her 87s    ALLERGIES:  is allergic to other; shellfish allergy;  erythromycin; moxifloxacin; penicillins; and tequin [gatifloxacin].  MEDICATIONS:  Current Outpatient Medications  Medication Sig Dispense Refill  . anastrozole (ARIMIDEX) 1 MG tablet Take 1 tablet (1 mg total) by mouth daily. 90 tablet 3  . Ascorbic Acid (VITAMIN C) 1000 MG tablet Take 3,000 mg by mouth daily.     . Calcium Carb-Cholecalciferol (CALCIUM 600 + D) 600-200 MG-UNIT TABS Take 1 tablet by mouth daily.    . Cholecalciferol (VITAMIN D3) 10000 UNITS capsule Take 10,000 Units by mouth daily.    . Coenzyme Q10 (COQ10) 400 MG CAPS Take 400 mg by mouth daily.    Marland Kitchen DANDELION PO Take 2 capsules by mouth daily.     . Digestive Enzymes CAPS Take 2 capsules by mouth 3 (three) times daily with meals.    Marland Kitchen doxycycline (VIBRA-TABS) 100 MG tablet Take 1 tablet (100 mg total) by mouth every 12 (twelve) hours. 4 tablet 0  . ferrous gluconate (FERGON) 325 MG tablet Take 1 tablet (325 mg total) by mouth 3 (three) times daily with meals. 30 tablet 0  . GARLIC PO Take 3 capsules by mouth daily.    Marland Kitchen guaiFENesin (ROBITUSSIN) 100 MG/5ML SOLN Take 20 mLs (400 mg total) by mouth every 4 (four) hours as needed for cough or to loosen phlegm. 1200 mL 0  . Guaifenesin 1200 MG TB12 Take 1,200 mg by mouth 2 (two) times daily as needed (cough).    . hydrALAZINE (APRESOLINE) 100 MG tablet Take 100 mg by mouth 2 (two) times daily.     . hydrochlorothiazide (HYDRODIURIL) 25 MG tablet Take 25 mg by mouth daily with lunch.     Marland Kitchen ipratropium-albuterol (DUONEB) 0.5-2.5 (3) MG/3ML SOLN Take 3 mLs by nebulization 2 (two) times daily. 360 mL 0  . labetalol (NORMODYNE) 300 MG tablet Take 600 mg by mouth 2 (two) times daily.     . Lactobacillus (PROBIOTIC ACIDOPHILUS PO) Take 2 tablets by mouth daily.    . Melatonin 10 MG TABS Take 20 mg by mouth at bedtime as needed (sleep).     . Multiple Vitamin (MULTIVITAMIN WITH MINERALS) TABS tablet Take 1 tablet by mouth at bedtime.    . Multiple Vitamins-Iron (CHLORELLA PO) Take 4 g  by mouth daily.    Marland Kitchen OVER THE COUNTER MEDICATION Take 2 capsules by mouth daily. Dim Supreme vegetarian capsules (from cruciferous vegetables)    . senna-docusate (SENOKOT-S) 8.6-50 MG tablet Take 1 tablet by mouth 2 (two) times daily. 10 tablet 0  . Turmeric POWD Take 7.5 mLs by mouth at bedtime.      No current facility-administered medications  for this visit.     REVIEW OF SYSTEMS:   Constitutional: Denies fevers, chills or abnormal night sweats Eyes: Denies blurriness of vision, double vision or watery eyes Ears, nose, mouth, throat, and face: Denies mucositis or sore throat Respiratory: Denies cough, dyspnea or wheezes Cardiovascular: Denies palpitation, chest discomfort or lower extremity swelling Gastrointestinal:  Denies nausea, heartburn or change in bowel habits Skin: Denies abnormal skin rashes Lymphatics: Denies new lymphadenopathy or easy bruising Neurological:Denies numbness, tingling or new weaknesses Behavioral/Psych: Mood is stable, no new changes   All other systems were reviewed with the patient and are negative.  PHYSICAL EXAMINATION: ECOG PERFORMANCE STATUS: 1 - Symptomatic but completely ambulatory  Vitals:   09/26/17 1549  BP: (!) 186/89  Pulse: 84  Resp: 18  Temp: 98.2 F (36.8 C)  SpO2: 98%   Filed Weights   09/26/17 1549  Weight: 274 lb (124.3 kg)    GENERAL:alert, no distress and comfortable SKIN: skin color, texture, turgor are normal, no rashes or significant lesions EYES: normal, conjunctiva are pink and non-injected, sclera clear OROPHARYNX:no exudate, no erythema and lips, buccal mucosa, and tongue normal  NECK: supple, thyroid normal size, non-tender, without nodularity LYMPH:  no palpable lymphadenopathy in the cervical, axillary or inguinal LUNGS: clear to auscultation and percussion with normal breathing effort HEART: regular rate & rhythm and no murmurs and no lower extremity edema ABDOMEN:abdomen soft, non-tender and normal bowel  sounds Musculoskeletal:no cyanosis of digits and no clubbing  PSYCH: alert & oriented x 3 with fluent speech NEURO: no focal motor/sensory deficits   LABORATORY DATA:  I have reviewed the data as listed Lab Results  Component Value Date   WBC 10.4 08/18/2017   HGB 8.6 (L) 08/18/2017   HCT 28.7 (L) 08/18/2017   MCV 74.0 (L) 08/18/2017   PLT 379 08/18/2017   Lab Results  Component Value Date   NA 135 08/16/2017   K 3.4 (L) 08/16/2017   CL 98 (L) 08/16/2017   CO2 25 08/16/2017    RADIOGRAPHIC STUDIES: I have personally reviewed the radiological reports and agreed with the findings in the report.  ASSESSMENT AND PLAN:  Cancer of upper-outer quadrant of female breast ([place]) Right breast invasive lobular cancer grade 1-2, ER 99%, PR 99%, HER-2 negative Ratio 1.3, Ki-67 13%, initial MRI revealed 7.1 cm area of mass or non-mass enhancement. Current treatment: Zoladex last given August 2017, anastrozole 1 mg daily Patient never had surgery because she does not wish to undergo mastectomy.  Recommendation: 1.  I reviewed her entire folder of information and it took more than an hour and half to review it. 2. we will continue with Zoladex and anastrozole.  I recommended the monthly Zoladex. 3.  We will obtain a mammogram and a breast MRI. 4.  Return to clinic after these imaging studies to discuss the treatment plan.  Microcytic anemia Chronic microcytic iron deficiency anemia: Possibly related to heavy menstrual bleeding versus malabsorption.  Patient has been on oral iron therapy for the last 20 years and most recent blood work showed profound anemia with a hemoglobin of 8.6 and MCV of 74 and a ferritin of 11 with an iron saturation of 5%.  She received 1 unit of blood transfusion in the hospital recently when she was admitted for pneumonia. Previously she received 10 units of blood transfusion during her myomectomy surgery.  Recommendation: Intravenous iron therapy with Feraheme 2  doses 1 week apart starting next week.  All questions were answered. The patient  knows to call the clinic with any problems, questions or concerns.    Harriette Ohara, MD 09/26/17

## 2017-09-26 NOTE — Assessment & Plan Note (Signed)
Chronic microcytic iron deficiency anemia: Possibly related to heavy menstrual bleeding versus malabsorption.  Patient has been on oral iron therapy for the last 20 years and most recent blood work showed profound anemia with a hemoglobin of 8.6 and MCV of 74 and a ferritin of 11 with an iron saturation of 5%.  She received 1 unit of blood transfusion in the hospital recently when she was admitted for pneumonia. Previously she received 10 units of blood transfusion during her myomectomy surgery.  Recommendation: Intravenous iron therapy with Feraheme 2 doses 1 week apart starting next week.

## 2017-10-02 ENCOUNTER — Inpatient Hospital Stay: Payer: 59 | Attending: Hematology and Oncology

## 2017-10-02 VITALS — BP 159/67 | HR 55 | Temp 98.9°F | Resp 17

## 2017-10-02 DIAGNOSIS — Z5111 Encounter for antineoplastic chemotherapy: Secondary | ICD-10-CM | POA: Diagnosis present

## 2017-10-02 DIAGNOSIS — C50411 Malignant neoplasm of upper-outer quadrant of right female breast: Secondary | ICD-10-CM | POA: Diagnosis not present

## 2017-10-02 DIAGNOSIS — D509 Iron deficiency anemia, unspecified: Secondary | ICD-10-CM | POA: Diagnosis present

## 2017-10-02 MED ORDER — GOSERELIN ACETATE 3.6 MG ~~LOC~~ IMPL
3.6000 mg | DRUG_IMPLANT | Freq: Once | SUBCUTANEOUS | Status: AC
  Administered 2017-10-02: 3.6 mg via SUBCUTANEOUS

## 2017-10-02 MED ORDER — SODIUM CHLORIDE 0.9 % IV SOLN
510.0000 mg | Freq: Once | INTRAVENOUS | Status: AC
  Administered 2017-10-02: 510 mg via INTRAVENOUS
  Filled 2017-10-02: qty 17

## 2017-10-02 MED ORDER — GOSERELIN ACETATE 3.6 MG ~~LOC~~ IMPL
DRUG_IMPLANT | SUBCUTANEOUS | Status: AC
  Filled 2017-10-02: qty 3.6

## 2017-10-02 MED ORDER — SODIUM CHLORIDE 0.9 % IV SOLN
Freq: Once | INTRAVENOUS | Status: AC
  Administered 2017-10-02: 16:00:00 via INTRAVENOUS

## 2017-10-02 NOTE — Patient Instructions (Signed)

## 2017-10-09 ENCOUNTER — Inpatient Hospital Stay: Payer: 59

## 2017-10-09 VITALS — BP 183/79 | HR 59 | Temp 98.8°F | Resp 17

## 2017-10-09 DIAGNOSIS — D509 Iron deficiency anemia, unspecified: Secondary | ICD-10-CM

## 2017-10-09 DIAGNOSIS — Z5111 Encounter for antineoplastic chemotherapy: Secondary | ICD-10-CM | POA: Diagnosis not present

## 2017-10-09 MED ORDER — SODIUM CHLORIDE 0.9 % IV SOLN
Freq: Once | INTRAVENOUS | Status: AC
  Administered 2017-10-09: 15:00:00 via INTRAVENOUS

## 2017-10-09 MED ORDER — SODIUM CHLORIDE 0.9 % IV SOLN
510.0000 mg | Freq: Once | INTRAVENOUS | Status: AC
  Administered 2017-10-09: 510 mg via INTRAVENOUS
  Filled 2017-10-09: qty 17

## 2017-10-09 NOTE — Progress Notes (Signed)
Ok to proceed with treatment with today's vital signs per Dr. Lindi Adie. Dr. Lindi Adie advised patient to follow-up with PCP, patient advised. Patient reports "It's fine. It's been higher than that. It'll come down." RN educated patient on importance of blood pressure management. Patient verbalized understanding; however, continuing to state "it's fine. It'll come down. My job is very stressful"

## 2017-10-09 NOTE — Patient Instructions (Signed)

## 2017-12-12 DIAGNOSIS — E669 Obesity, unspecified: Secondary | ICD-10-CM | POA: Diagnosis not present

## 2017-12-12 DIAGNOSIS — C50919 Malignant neoplasm of unspecified site of unspecified female breast: Secondary | ICD-10-CM | POA: Diagnosis not present

## 2017-12-12 DIAGNOSIS — I10 Essential (primary) hypertension: Secondary | ICD-10-CM | POA: Diagnosis not present

## 2018-01-10 ENCOUNTER — Ambulatory Visit (HOSPITAL_COMMUNITY)
Admission: EM | Admit: 2018-01-10 | Discharge: 2018-01-10 | Disposition: A | Discharge status: Home / Self Care | Payer: 59 | Attending: Family Medicine | Admitting: Family Medicine

## 2018-01-10 ENCOUNTER — Encounter (HOSPITAL_COMMUNITY): Payer: Self-pay | Admitting: Emergency Medicine

## 2018-01-10 DIAGNOSIS — M25561 Pain in right knee: Secondary | ICD-10-CM | POA: Diagnosis not present

## 2018-01-10 MED ORDER — MELOXICAM 7.5 MG PO TABS: 7.5000 mg | ORAL_TABLET | Freq: Every day | ORAL | 0 refills | Status: DC

## 2018-01-10 NOTE — Discharge Instructions (Addendum)
Ice and elevation, especially at the end of the night. Daily meloxicam, take with food and do not take additional ibuprofen. May take addition tylenol as needed. Knee sleeve for support and compression. If no improvement or worsening of pain please follow up with orthopedics.

## 2018-01-10 NOTE — ED Triage Notes (Signed)
Pt states she woke up Tuesday with R knee pain, denies recent injury. Pt limping.

## 2018-01-10 NOTE — ED Provider Notes (Signed)
[facility]    CSN: [id number] Arrival date & time: 01/10/18  1753     History   Chief Complaint Chief Complaint  Patient presents with  . Knee Pain    HPI Jasmine Stafford is a 52 y.o. female.   Kaedence presents presents with complaints of right knee pain which started 3 days ago, no specific known injury. No numbness or tingling. Worse with activity. No weakness. Yesterday had improved. This morning felt stiff. Today sat more at work to try to rest it. This afternoon pain worsened. Took ibuprofen last night, did not take any today. No previous knee surgery. States strained knee in the past. Hx of anemia, breast cancer, genital warts, chicken pox, htn, constipation.    ROS per HPI.      Past Medical History:  Diagnosis Date  . Anemia   . Breast cancer ([place])   . Genital warts   . History of chicken pox   . HTN (hypertension)   . Status post blood transfusion without reported diagnosis 1998, 2002, 2005 x2    Patient Active Problem List   Diagnosis Date Noted  . Hypertensive urgency 08/13/2017  . CAP (community acquired pneumonia) 08/13/2017  . Intractable cyclical vomiting with nausea   . Cancer of upper-outer quadrant of female breast ([place]) 08/15/2012  . Acute kidney injury ([place]) 04/01/2012  . Hypokalemia 04/01/2012  . Dehydration 04/01/2012  . Gastroenteritis 04/01/2012  . Obesity (BMI 35.0-39.9 without comorbidity) 04/01/2012  . Constipation 04/01/2012  . Microcytic anemia 04/01/2012  . Upper respiratory infection 10/21/2010  . Seromucinous otitis media 10/21/2010  . Hypertrophy of tonsil 10/21/2010  . BACK PAIN, THORACIC REGION 06/29/2010  . Essential hypertension 05/21/2010  . BACK PAIN 05/21/2010    Past Surgical History:  Procedure Laterality Date  . CERVICAL CONE BIOPSY     cervical tumors  . MYOMECTOMY      OB History   None      Home Medications    Prior to Admission medications   Medication Sig Start Date End Date Taking?  Authorizing Provider  anastrozole (ARIMIDEX) 1 MG tablet Take 1 tablet (1 mg total) by mouth daily. 09/26/17   Nicholas Lose, MD  Ascorbic Acid (VITAMIN C) 1000 MG tablet Take 3,000 mg by mouth daily.     [provider]  Calcium Carb-Cholecalciferol (CALCIUM 600 + D) 600-200 MG-UNIT TABS Take 1 tablet by mouth daily.    [provider]  Cholecalciferol (VITAMIN D3) 10000 UNITS capsule Take 10,000 Units by mouth daily.    [provider]  Coenzyme Q10 (COQ10) 400 MG CAPS Take 400 mg by mouth daily.    [provider]  DANDELION PO Take 2 capsules by mouth daily.     [provider]  Digestive Enzymes CAPS Take 2 capsules by mouth 3 (three) times daily with meals.    [provider]  doxycycline (VIBRA-TABS) 100 MG tablet Take 1 tablet (100 mg total) by mouth every 12 (twelve) hours. 08/18/17   Regalado, Belkys A, MD  ferrous gluconate (FERGON) 325 MG tablet Take 1 tablet (325 mg total) by mouth 3 (three) times daily with meals. 08/18/17   Regalado, Belkys A, MD  GARLIC PO Take 3 capsules by mouth daily.    [provider]  guaiFENesin (ROBITUSSIN) 100 MG/5ML SOLN Take 20 mLs (400 mg total) by mouth every 4 (four) hours as needed for cough or to loosen phlegm. 08/18/17   Regalado, Cassie Freer, MD  Guaifenesin 1200  MG TB12 Take 1,200 mg by mouth 2 (two) times daily as needed (cough).    [provider]  hydrALAZINE (APRESOLINE) 100 MG tablet Take 100 mg by mouth 3 (three) times daily.     [provider]  hydrochlorothiazide (HYDRODIURIL) 25 MG tablet Take 25 mg by mouth daily with lunch.     [provider]  ipratropium-albuterol (DUONEB) 0.5-2.5 (3) MG/3ML SOLN Take 3 mLs by nebulization 2 (two) times daily. 08/18/17   Regalado, Belkys A, MD  labetalol (NORMODYNE) 300 MG tablet Take 600 mg by mouth 2 (two) times daily.     [provider]  Lactobacillus (PROBIOTIC ACIDOPHILUS PO) Take 2 tablets by mouth daily.     [provider]  Melatonin 10 MG TABS Take 20 mg by mouth at bedtime as needed (sleep).     [provider]  meloxicam (MOBIC) 7.5 MG tablet Take 1 tablet (7.5 mg total) by mouth daily. 01/10/18   Zigmund Gottron, NP  Multiple Vitamin (MULTIVITAMIN WITH MINERALS) TABS tablet Take 1 tablet by mouth at bedtime.    [provider]  Multiple Vitamins-Iron (CHLORELLA PO) Take 4 g by mouth daily.    [provider]  OVER THE COUNTER MEDICATION Take 2 capsules by mouth daily. Dim Supreme vegetarian capsules (from cruciferous vegetables)    [provider]  senna-docusate (SENOKOT-S) 8.6-50 MG tablet Take 1 tablet by mouth 2 (two) times daily. 08/18/17   Regalado, Belkys A, MD  Turmeric POWD Take 7.5 mLs by mouth at bedtime.     [provider]    Family History Family History  Problem Relation Age of Onset  . Colon cancer Other        MGM's maternal half sister  . Breast cancer Mother 77       also diagnosed at 59 and 30  . Diabetes Father   . Hypertension Father   . Prostate cancer Father 63  . Diabetes Brother   . Colon cancer Maternal Aunt   . Breast cancer Maternal Aunt 51  . Rectal cancer Maternal Aunt   . Brain cancer Paternal Aunt        dx in her 83s  . Stomach cancer Paternal Uncle 67  . Lung cancer Maternal Grandfather 65  . Tuberculosis Maternal Grandfather   . Tuberculosis Maternal Grandmother   . Diabetes Paternal Grandmother   . Breast cancer Other 49       Maternal great grandmother  . Lung cancer Paternal Uncle 21  . Cancer Paternal Uncle        cancerous tumor on his back  . Breast cancer Cousin        paternal cousin dx in her 64s    Social History Social History   Tobacco Use  . Smoking status: Never Smoker  . Smokeless tobacco: Never Used  Substance Use Topics  . Alcohol use: No  . Drug use: No     Allergies   Other; Shellfish allergy; Erythromycin; Moxifloxacin; Penicillins; and Tequin  [gatifloxacin]   Review of Systems Review of Systems   Physical Exam Triage Vital Signs ED Triage Vitals [01/10/18 1823]  Enc Vitals Group     BP (!) 147/72     Pulse Rate 65     Resp 18     Temp 98.5 F (36.9 C)     Temp src      SpO2 95 %     Weight      Height  Head Circumference      Peak Flow      Pain Score      Pain Loc      Pain Edu?      Excl. in [place]?    No data found.  Updated Vital Signs BP (!) 147/72   Pulse 65   Temp 98.5 F (36.9 C)   Resp 18   SpO2 95%   Visual Acuity Right Eye Distance:   Left Eye Distance:   Bilateral Distance:    Right Eye Near:   Left Eye Near:    Bilateral Near:     Physical Exam  Constitutional: She is oriented to person, place, and time. She appears well-developed and well-nourished. No distress.  Cardiovascular: Normal rate, regular rhythm and normal heart sounds.  Pulmonary/Chest: Effort normal and breath sounds normal.  Musculoskeletal:       Right knee: She exhibits swelling. She exhibits normal range of motion, no effusion, no ecchymosis, no deformity, no laceration, no erythema, normal alignment, no LCL laxity, no bony tenderness, normal meniscus and no MCL laxity. Tenderness found. Medial joint line and MCL tenderness noted. No lateral joint line, no LCL and no patellar tendon tenderness noted.       Legs: Tenderness at medial knee with mild swelling; no pain with flexion or extension; no pain with medial or lateral stress; strength equal bilaterally and sensation intact   Neurological: She is alert and oriented to person, place, and time.  Skin: Skin is warm and dry.     UC Treatments / Results  Labs (all labs ordered are listed, but only abnormal results are displayed) Labs Reviewed - No data to display  EKG None  Radiology No results found.  Procedures Procedures (including critical care time)  Medications Ordered in UC Medications - No data to display  Initial Impression / Assessment and  Plan / UC Course  I have reviewed the triage vital signs and the nursing notes.  Pertinent labs & imaging results that were available during my care of the patient were reviewed by me and considered in my medical decision making (see chart for details).     No injury. Pain worse with immobility today. Question arthritis vs strain. Pain primarily to medial knee. Nsaids, exercises, compression with sleeve recommended. Follow up with PCP and/or orthopedics for recheck for persistent symptoms.Patient verbalized understanding and agreeable to plan.    Final Clinical Impressions(s) / UC Diagnoses   Final diagnoses:  Acute pain of right knee     Discharge Instructions     Ice and elevation, especially at the end of the night. Daily meloxicam, take with food and do not take additional ibuprofen. May take addition tylenol as needed. Knee sleeve for support and compression. If no improvement or worsening of pain please follow up with orthopedics.    ED Prescriptions    Medication Sig Dispense Auth. Provider   meloxicam (MOBIC) 7.5 MG tablet Take 1 tablet (7.5 mg total) by mouth daily. 20 tablet Zigmund Gottron, NP     Controlled Substance Prescriptions [place] Controlled Substance Registry consulted? Not Applicable   Zigmund Gottron, NP 01/10/18 1902

## 2018-02-06 DIAGNOSIS — C50919 Malignant neoplasm of unspecified site of unspecified female breast: Secondary | ICD-10-CM | POA: Diagnosis not present

## 2018-02-06 DIAGNOSIS — N182 Chronic kidney disease, stage 2 (mild): Secondary | ICD-10-CM | POA: Diagnosis not present

## 2018-02-06 DIAGNOSIS — I10 Essential (primary) hypertension: Secondary | ICD-10-CM | POA: Diagnosis not present

## 2018-10-22 ENCOUNTER — Other Ambulatory Visit: Payer: Self-pay | Admitting: Hematology and Oncology

## 2018-10-24 ENCOUNTER — Telehealth: Payer: Self-pay | Admitting: Hematology and Oncology

## 2018-10-24 NOTE — Telephone Encounter (Signed)
Scheduled appt per 3/24 sch message - pt is aware of appt date and time

## 2018-11-20 ENCOUNTER — Ambulatory Visit: Payer: 59 | Admitting: Internal Medicine

## 2018-11-20 ENCOUNTER — Other Ambulatory Visit (INDEPENDENT_AMBULATORY_CARE_PROVIDER_SITE_OTHER): Payer: 59

## 2018-11-20 ENCOUNTER — Other Ambulatory Visit: Payer: Self-pay

## 2018-11-20 ENCOUNTER — Encounter: Payer: Self-pay | Admitting: Internal Medicine

## 2018-11-20 VITALS — BP 166/98 | HR 81 | Temp 99.0°F | Ht 64.0 in | Wt 276.0 lb

## 2018-11-20 DIAGNOSIS — R109 Unspecified abdominal pain: Secondary | ICD-10-CM

## 2018-11-20 DIAGNOSIS — I10 Essential (primary) hypertension: Secondary | ICD-10-CM

## 2018-11-20 DIAGNOSIS — R300 Dysuria: Secondary | ICD-10-CM

## 2018-11-20 DIAGNOSIS — K5900 Constipation, unspecified: Secondary | ICD-10-CM

## 2018-11-20 DIAGNOSIS — D509 Iron deficiency anemia, unspecified: Secondary | ICD-10-CM | POA: Diagnosis not present

## 2018-11-20 LAB — CBC WITH DIFFERENTIAL/PLATELET
Basophils Absolute: 0 10*3/uL (ref 0.0–0.1)
Basophils Relative: 0.3 % (ref 0.0–3.0)
Eosinophils Absolute: 0 10*3/uL (ref 0.0–0.7)
Eosinophils Relative: 0.2 % (ref 0.0–5.0)
HCT: 31.6 % — ABNORMAL LOW (ref 36.0–46.0)
Hemoglobin: 10.2 g/dL — ABNORMAL LOW (ref 12.0–15.0)
Lymphocytes Relative: 6.7 % — ABNORMAL LOW (ref 12.0–46.0)
Lymphs Abs: 1.1 10*3/uL (ref 0.7–4.0)
MCHC: 32.2 g/dL (ref 30.0–36.0)
MCV: 79.2 fl (ref 78.0–100.0)
Monocytes Absolute: 1 10*3/uL (ref 0.1–1.0)
Monocytes Relative: 6.1 % (ref 3.0–12.0)
Neutro Abs: 14 10*3/uL — ABNORMAL HIGH (ref 1.4–7.7)
Neutrophils Relative %: 86.7 % — ABNORMAL HIGH (ref 43.0–77.0)
Platelets: 368 10*3/uL (ref 150.0–400.0)
RBC: 3.99 Mil/uL (ref 3.87–5.11)
RDW: 18 % — ABNORMAL HIGH (ref 11.5–15.5)
WBC: 16.1 10*3/uL — ABNORMAL HIGH (ref 4.0–10.5)

## 2018-11-20 LAB — URINALYSIS, ROUTINE W REFLEX MICROSCOPIC
Hgb urine dipstick: NEGATIVE
Nitrite: POSITIVE — AB
Specific Gravity, Urine: 1.025 (ref 1.000–1.030)
Total Protein, Urine: 100 — AB
Urine Glucose: 100 — AB
Urobilinogen, UA: 4 — AB (ref 0.0–1.0)
pH: 5.5 (ref 5.0–8.0)

## 2018-11-20 LAB — BASIC METABOLIC PANEL
BUN: 14 mg/dL (ref 6–23)
CO2: 28 mEq/L (ref 19–32)
Calcium: 8.6 mg/dL (ref 8.4–10.5)
Chloride: 100 mEq/L (ref 96–112)
Creatinine, Ser: 1.04 mg/dL (ref 0.40–1.20)
GFR: 67.41 mL/min (ref >60.00)
Glucose, Bld: 104 mg/dL — ABNORMAL HIGH (ref 70–99)
Potassium: 3.4 mEq/L — ABNORMAL LOW (ref 3.5–5.1)
Sodium: 137 mEq/L (ref 135–145)

## 2018-11-20 LAB — IBC PANEL
Iron: 10 ug/dL — ABNORMAL LOW (ref 42–145)
Saturation Ratios: 2.1 % — ABNORMAL LOW (ref 20.0–50.0)
Transferrin: 334 mg/dL (ref 212.0–360.0)

## 2018-11-20 LAB — LIPASE: Lipase: 6 U/L — ABNORMAL LOW (ref 11.0–59.0)

## 2018-11-20 LAB — HEPATIC FUNCTION PANEL
ALT: 17 U/L (ref 0–35)
AST: 14 U/L (ref 0–37)
Albumin: 3.9 g/dL (ref 3.5–5.2)
Alkaline Phosphatase: 62 U/L (ref 39–117)
Bilirubin, Direct: 0.2 mg/dL (ref 0.0–0.3)
Total Bilirubin: 0.8 mg/dL (ref 0.2–1.2)
Total Protein: 7.7 g/dL (ref 6.0–8.3)

## 2018-11-20 MED ORDER — POLYETHYLENE GLYCOL 3350 17 GM/SCOOP PO POWD: 17.0000 g | Freq: Two times a day (BID) | ORAL | 1 refills | Status: DC | PRN

## 2018-11-20 MED ORDER — ONDANSETRON HCL 4 MG PO TABS: 4.0000 mg | ORAL_TABLET | Freq: Three times a day (TID) | ORAL | 0 refills | Status: DC | PRN

## 2018-11-20 MED ORDER — SULFAMETHOXAZOLE-TRIMETHOPRIM 800-160 MG PO TABS: 1.0000 | ORAL_TABLET | Freq: Two times a day (BID) | ORAL | 0 refills | Status: DC

## 2018-11-20 MED ORDER — TRAMADOL HCL 50 MG PO TABS: 50.0000 mg | ORAL_TABLET | Freq: Four times a day (QID) | ORAL | 0 refills | Status: DC | PRN

## 2018-11-20 NOTE — Patient Instructions (Addendum)
Please take all new medication as prescribed - the antibiotic, and pain and nausea medication if needed, and miralax for constipation  Please continue all other medications as before, and refills have been done if requested.  Please have the pharmacy call with any other refills you may need.  Please keep your appointments with your specialists as you may have planned  Please go to the LAB in the Basement (turn left off the elevator) for the tests to be done today  You will be contacted by phone if any changes need to be made immediately.  Otherwise, you will receive a letter about your results with an explanation, but please check with MyChart first.  Please remember to sign up for MyChart if you have not done so, as this will be important to you in the future with finding out test results, communicating by private email, and scheduling acute appointments online when needed.

## 2018-11-20 NOTE — Assessment & Plan Note (Signed)
Symptoms likely c/w UTI, cant r/o stone, but will need UA and cx, empiric septra ds, and labs as ordered, consider urology referral and/o CT scan

## 2018-11-20 NOTE — Assessment & Plan Note (Signed)
Likely iron deficiency, for cbc, iron panel today,  to f/u any worsening symptoms or concerns

## 2018-11-20 NOTE — Assessment & Plan Note (Signed)
Elevated today, likely situational, ok to cont same tx

## 2018-11-20 NOTE — Progress Notes (Signed)
Subjective:    Patient ID: Jasmine Stafford, female    DOB: 1966-02-21, 53 y.o.   MRN: [id number]  HPI  Here with acute onset 2-3 days moderate low abd pain burning and pressure like, with urinary pain and frequency, Nausea, as well as small BRB, but Denies urinary symptoms such as urgency, flank pain, or vomiting, fever, chills. Pt denies chest pain, increased sob or doe, wheezing, orthopnea, PND, increased LE swelling, palpitations, dizziness or syncope.  Does have ongoing menorrhagia with fatigue, has known fibroids, trying to avoid TAH.  Due for cbc/iron f/u as well.  Denies worsening reflux, dysphagia,or blood but has current mild constipatoin as well, tends to be a recurrent issue..  BP at home usually much better.  Urine currently orange on Azo.  Has been drinking more fluids and alovera juice.   Past Medical History:  Diagnosis Date  . Anemia   . Breast cancer ([place])   . Genital warts   . History of chicken pox   . HTN (hypertension)   . Status post blood transfusion without reported diagnosis 1998, 2002, 2005 x2   Past Surgical History:  Procedure Laterality Date  . CERVICAL CONE BIOPSY     cervical tumors  . MYOMECTOMY      reports that she has never smoked. She has never used smokeless tobacco. She reports that she does not drink alcohol or use drugs. family history includes Brain cancer in her paternal aunt; Breast cancer in her cousin; Breast cancer (age of onset: 93) in her mother and another family member; Breast cancer (age of onset: 40) in her maternal aunt; Cancer in her paternal uncle; Colon cancer in her maternal aunt and another family member; Diabetes in her brother, father, and paternal grandmother; Hypertension in her father; Lung cancer (age of onset: 4) in her paternal uncle; Lung cancer (age of onset: 42) in her maternal grandfather; Prostate cancer (age of onset: 2) in her father; Rectal cancer in her maternal aunt; Stomach cancer (age of onset: 39) in her paternal  uncle; Tuberculosis in her maternal grandfather and maternal grandmother. Allergies  Allergen Reactions  . Other Hives    Reaction to Tomatoes & Mushrooms  . Shellfish Allergy Hives  . Erythromycin Hives  . Moxifloxacin Hives  . Penicillins Hives    Has patient had a PCN reaction causing immediate rash, facial/tongue/throat swelling, SOB or lightheadedness with hypotension: Yes Has patient had a PCN reaction causing severe rash involving mucus membranes or skin necrosis: No Has patient had a PCN reaction that required hospitalization: No Has patient had a PCN reaction occurring within the last 10 years: No If all of the above answers are "NO", then may proceed with Cephalosporin use.  Flo Shanks [Gatifloxacin] Hives   Current Outpatient Medications on File Prior to Visit  Medication Sig Dispense Refill  . anastrozole (ARIMIDEX) 1 MG tablet TAKE 1 TABLET BY MOUTH EVERY DAY 30 tablet 3  . Ascorbic Acid (VITAMIN C) 1000 MG tablet Take 3,000 mg by mouth daily.     . Calcium Carb-Cholecalciferol (CALCIUM 600 + D) 600-200 MG-UNIT TABS Take 1 tablet by mouth daily.    . Cholecalciferol (VITAMIN D3) 10000 UNITS capsule Take 10,000 Units by mouth daily.    . Coenzyme Q10 (COQ10) 400 MG CAPS Take 400 mg by mouth daily.    Marland Kitchen DANDELION PO Take 2 capsules by mouth daily.     . Digestive Enzymes CAPS Take 2 capsules by mouth 3 (three) times daily with  meals.    . doxycycline (VIBRA-TABS) 100 MG tablet Take 1 tablet (100 mg total) by mouth every 12 (twelve) hours. 4 tablet 0  . ferrous gluconate (FERGON) 325 MG tablet Take 1 tablet (325 mg total) by mouth 3 (three) times daily with meals. 30 tablet 0  . GARLIC PO Take 3 capsules by mouth daily.    Marland Kitchen guaiFENesin (ROBITUSSIN) 100 MG/5ML SOLN Take 20 mLs (400 mg total) by mouth every 4 (four) hours as needed for cough or to loosen phlegm. 1200 mL 0  . Guaifenesin 1200 MG TB12 Take 1,200 mg by mouth 2 (two) times daily as needed (cough).    . hydrALAZINE  (APRESOLINE) 100 MG tablet Take 100 mg by mouth 3 (three) times daily.     . hydrochlorothiazide (HYDRODIURIL) 25 MG tablet Take 25 mg by mouth daily with lunch.     Marland Kitchen ipratropium-albuterol (DUONEB) 0.5-2.5 (3) MG/3ML SOLN Take 3 mLs by nebulization 2 (two) times daily. 360 mL 0  . labetalol (NORMODYNE) 300 MG tablet Take 600 mg by mouth 2 (two) times daily.     . Lactobacillus (PROBIOTIC ACIDOPHILUS PO) Take 2 tablets by mouth daily.    . Melatonin 10 MG TABS Take 20 mg by mouth at bedtime as needed (sleep).     . meloxicam (MOBIC) 7.5 MG tablet Take 1 tablet (7.5 mg total) by mouth daily. 20 tablet 0  . Multiple Vitamin (MULTIVITAMIN WITH MINERALS) TABS tablet Take 1 tablet by mouth at bedtime.    . Multiple Vitamins-Iron (CHLORELLA PO) Take 4 g by mouth daily.    Marland Kitchen OVER THE COUNTER MEDICATION Take 2 capsules by mouth daily. Dim Supreme vegetarian capsules (from cruciferous vegetables)    . senna-docusate (SENOKOT-S) 8.6-50 MG tablet Take 1 tablet by mouth 2 (two) times daily. 10 tablet 0  . Turmeric POWD Take 7.5 mLs by mouth at bedtime.      No current facility-administered medications on file prior to visit.    Review of Systems  Constitutional: Negative for other unusual diaphoresis or sweats HENT: Negative for ear discharge or swelling Eyes: Negative for other worsening visual disturbances Respiratory: Negative for stridor or other swelling  Gastrointestinal: Negative for worsening distension or other blood Genitourinary: Negative for retention or other urinary change Musculoskeletal: Negative for other MSK pain or swelling Skin: Negative for color change or other new lesions Neurological: Negative for worsening tremors and other numbness  Psychiatric/Behavioral: Negative for worsening agitation or other fatigue All other system neg per pt    Objective:   Physical Exam BP (!) 166/98   Pulse 81   Temp 99 F (37.2 C) (Oral)   Ht 5\' 4"  (1.626 m)   Wt 276 lb (125.2 kg)   SpO2  97%   BMI 47.38 kg/m  VS noted, mild to mod ill and uncomfortable with pointing to low abd with any movement, somewhat grimacing Constitutional: Pt appears in NAD HENT: Head: NCAT.  Right Ear: External ear normal.  Left Ear: External ear normal.  Eyes: . Pupils are equal, round, and reactive to light. Conjunctivae and EOM are normal Nose: without d/c or deformity Neck: Neck supple. Gross normal ROM Cardiovascular: Normal rate and regular rhythm.   Pulmonary/Chest: Effort normal and breath sounds without rales or wheezing.  Abd:  Soft, ND, + BS, no organomegaly with tender mid low abd and somewhat LLQ as well Neurological: Pt is alert. At baseline orientation, motor grossly intact Skin: Skin is warm. No rashes, other new lesions,  no LE edema Psychiatric: Pt behavior is normal without agitation  No other exam findings Lab Results  Component Value Date   WBC 10.4 08/18/2017   HGB 8.6 (L) 08/18/2017   HCT 28.7 (L) 08/18/2017   PLT 379 08/18/2017   GLUCOSE 117 (H) 08/16/2017   ALT 29 08/13/2017   AST 38 08/13/2017   NA 135 08/16/2017   K 3.4 (L) 08/16/2017   CL 98 (L) 08/16/2017   CREATININE 0.92 08/16/2017   BUN 10 08/16/2017   CO2 25 08/16/2017       Assessment & Plan:

## 2018-11-20 NOTE — Assessment & Plan Note (Signed)
As above.

## 2018-11-20 NOTE — Assessment & Plan Note (Signed)
[place] for [employer] asd,  to f/u any worsening symptoms or concerns

## 2018-11-21 ENCOUNTER — Other Ambulatory Visit: Payer: Self-pay

## 2018-11-21 ENCOUNTER — Encounter (HOSPITAL_COMMUNITY): Payer: Self-pay | Admitting: Emergency Medicine

## 2018-11-21 ENCOUNTER — Observation Stay (HOSPITAL_COMMUNITY): Payer: 59

## 2018-11-21 ENCOUNTER — Ambulatory Visit: Payer: Self-pay

## 2018-11-21 ENCOUNTER — Encounter: Payer: Self-pay | Admitting: Internal Medicine

## 2018-11-21 ENCOUNTER — Observation Stay (HOSPITAL_COMMUNITY)
Admission: EM | Admit: 2018-11-21 | Discharge: 2018-11-22 | Disposition: A | Discharge status: Home / Self Care | Payer: 59 | Attending: Internal Medicine | Admitting: Internal Medicine

## 2018-11-21 ENCOUNTER — Telehealth: Payer: Self-pay

## 2018-11-21 DIAGNOSIS — Z791 Long term (current) use of non-steroidal anti-inflammatories (NSAID): Secondary | ICD-10-CM | POA: Diagnosis not present

## 2018-11-21 DIAGNOSIS — Z853 Personal history of malignant neoplasm of breast: Secondary | ICD-10-CM | POA: Diagnosis not present

## 2018-11-21 DIAGNOSIS — R300 Dysuria: Secondary | ICD-10-CM | POA: Diagnosis not present

## 2018-11-21 DIAGNOSIS — Z7951 Long term (current) use of inhaled steroids: Secondary | ICD-10-CM | POA: Diagnosis not present

## 2018-11-21 DIAGNOSIS — I10 Essential (primary) hypertension: Secondary | ICD-10-CM | POA: Diagnosis not present

## 2018-11-21 DIAGNOSIS — Z79899 Other long term (current) drug therapy: Secondary | ICD-10-CM | POA: Insufficient documentation

## 2018-11-21 DIAGNOSIS — N3001 Acute cystitis with hematuria: Secondary | ICD-10-CM | POA: Insufficient documentation

## 2018-11-21 DIAGNOSIS — E876 Hypokalemia: Secondary | ICD-10-CM | POA: Diagnosis not present

## 2018-11-21 DIAGNOSIS — Z6841 Body Mass Index (BMI) 40.0 and over, adult: Secondary | ICD-10-CM | POA: Insufficient documentation

## 2018-11-21 DIAGNOSIS — D259 Leiomyoma of uterus, unspecified: Secondary | ICD-10-CM | POA: Diagnosis not present

## 2018-11-21 DIAGNOSIS — R509 Fever, unspecified: Secondary | ICD-10-CM | POA: Diagnosis not present

## 2018-11-21 DIAGNOSIS — D509 Iron deficiency anemia, unspecified: Secondary | ICD-10-CM | POA: Insufficient documentation

## 2018-11-21 DIAGNOSIS — R112 Nausea with vomiting, unspecified: Principal | ICD-10-CM | POA: Diagnosis present

## 2018-11-21 DIAGNOSIS — Z8249 Family history of ischemic heart disease and other diseases of the circulatory system: Secondary | ICD-10-CM | POA: Diagnosis not present

## 2018-11-21 DIAGNOSIS — R319 Hematuria, unspecified: Secondary | ICD-10-CM | POA: Diagnosis not present

## 2018-11-21 DIAGNOSIS — E860 Dehydration: Secondary | ICD-10-CM | POA: Insufficient documentation

## 2018-11-21 LAB — CBC WITH DIFFERENTIAL/PLATELET
Abs Immature Granulocytes: 0.1 10*3/uL — ABNORMAL HIGH (ref 0.00–0.07)
Basophils Absolute: 0 10*3/uL (ref 0.0–0.1)
Basophils Relative: 0 %
Eosinophils Absolute: 0 10*3/uL (ref 0.0–0.5)
Eosinophils Relative: 0 %
HCT: 32.8 % — ABNORMAL LOW (ref 36.0–46.0)
Hemoglobin: 10.1 g/dL — ABNORMAL LOW (ref 12.0–15.0)
Immature Granulocytes: 1 %
Lymphocytes Relative: 8 %
Lymphs Abs: 1.4 10*3/uL (ref 0.7–4.0)
MCH: 25.1 pg — ABNORMAL LOW (ref 26.0–34.0)
MCHC: 30.8 g/dL (ref 30.0–36.0)
MCV: 81.4 fL (ref 80.0–100.0)
Monocytes Absolute: 1.1 10*3/uL — ABNORMAL HIGH (ref 0.1–1.0)
Monocytes Relative: 6 %
Neutro Abs: 15.4 10*3/uL — ABNORMAL HIGH (ref 1.7–7.7)
Neutrophils Relative %: 85 %
Platelets: 415 10*3/uL — ABNORMAL HIGH (ref 150–400)
RBC: 4.03 MIL/uL (ref 3.87–5.11)
RDW: 17.2 % — ABNORMAL HIGH (ref 11.5–15.5)
WBC: 18.1 10*3/uL — ABNORMAL HIGH (ref 4.0–10.5)
nRBC: 0 % (ref 0.0–0.2)

## 2018-11-21 LAB — URINALYSIS, ROUTINE W REFLEX MICROSCOPIC
Bilirubin Urine: NEGATIVE
Glucose, UA: NEGATIVE mg/dL
Ketones, ur: 20 mg/dL — AB
Leukocytes,Ua: NEGATIVE
Nitrite: POSITIVE — AB
Protein, ur: 100 mg/dL — AB
RBC / HPF: >50 RBC/hpf — ABNORMAL HIGH (ref 0–5)
Specific Gravity, Urine: 1.018 (ref 1.005–1.030)
pH: 6 (ref 5.0–8.0)

## 2018-11-21 LAB — URINE CULTURE
MICRO NUMBER:: 413754
Result:: NO GROWTH
SPECIMEN QUALITY:: ADEQUATE

## 2018-11-21 LAB — COMPREHENSIVE METABOLIC PANEL
ALT: 19 U/L (ref 0–44)
AST: 20 U/L (ref 15–41)
Albumin: 3.6 g/dL (ref 3.5–5.0)
Alkaline Phosphatase: 67 U/L (ref 38–126)
Anion gap: 14 (ref 5–15)
BUN: 10 mg/dL (ref 6–20)
CO2: 23 mmol/L (ref 22–32)
Calcium: 8.9 mg/dL (ref 8.9–10.3)
Chloride: 99 mmol/L (ref 98–111)
Creatinine, Ser: 1.05 mg/dL — ABNORMAL HIGH (ref 0.44–1.00)
GFR calc Af Amer: >60 mL/min (ref >60)
GFR calc non Af Amer: >60 mL/min (ref >60)
Glucose, Bld: 112 mg/dL — ABNORMAL HIGH (ref 70–99)
Potassium: 3.3 mmol/L — ABNORMAL LOW (ref 3.5–5.1)
Sodium: 136 mmol/L (ref 135–145)
Total Bilirubin: 1 mg/dL (ref 0.3–1.2)
Total Protein: 8.2 g/dL — ABNORMAL HIGH (ref 6.5–8.1)

## 2018-11-21 LAB — LIPASE, BLOOD: Lipase: 23 U/L (ref 11–51)

## 2018-11-21 MED ORDER — DROPERIDOL 2.5 MG/ML IJ SOLN
2.5000 mg | Freq: Once | INTRAMUSCULAR | Status: AC
  Administered 2018-11-21: 18:00:00 2.5 mg via INTRAVENOUS
  Filled 2018-11-21: qty 2

## 2018-11-21 MED ORDER — ACETAMINOPHEN 325 MG PO TABS: 650.0000 mg | ORAL_TABLET | Freq: Four times a day (QID) | ORAL | Status: DC | PRN

## 2018-11-21 MED ORDER — HYDRALAZINE HCL 20 MG/ML IJ SOLN
10.0000 mg | Freq: Four times a day (QID) | INTRAMUSCULAR | Status: DC | PRN
  Administered 2018-11-21: 20:00:00 10 mg via INTRAVENOUS
  Filled 2018-11-21: qty 1

## 2018-11-21 MED ORDER — POLYETHYLENE GLYCOL 3350 17 G PO PACK: 17.0000 g | PACK | Freq: Two times a day (BID) | ORAL | Status: DC | PRN

## 2018-11-21 MED ORDER — SODIUM CHLORIDE 0.9 % IV SOLN
1.0000 g | Freq: Once | INTRAVENOUS | Status: AC
  Administered 2018-11-21: 1 g via INTRAVENOUS
  Filled 2018-11-21: qty 10

## 2018-11-21 MED ORDER — FERROUS GLUCONATE 324 (38 FE) MG PO TABS
324.0000 mg | ORAL_TABLET | Freq: Every day | ORAL | Status: DC
  Filled 2018-11-21: qty 1

## 2018-11-21 MED ORDER — ONDANSETRON HCL 4 MG/2ML IJ SOLN
4.0000 mg | Freq: Once | INTRAMUSCULAR | Status: AC
  Administered 2018-11-21: 4 mg via INTRAVENOUS
  Filled 2018-11-21: qty 2

## 2018-11-21 MED ORDER — SODIUM CHLORIDE 0.9 % IV BOLUS
1000.0000 mL | Freq: Once | INTRAVENOUS | Status: AC
  Administered 2018-11-21: 1000 mL via INTRAVENOUS

## 2018-11-21 MED ORDER — ONDANSETRON HCL 4 MG/2ML IJ SOLN
4.0000 mg | Freq: Four times a day (QID) | INTRAMUSCULAR | Status: DC | PRN
  Administered 2018-11-21: 4 mg via INTRAVENOUS
  Filled 2018-11-21: qty 2

## 2018-11-21 MED ORDER — SODIUM CHLORIDE 0.9% FLUSH
3.0000 mL | Freq: Two times a day (BID) | INTRAVENOUS | Status: DC
  Administered 2018-11-21: 10 mL via INTRAVENOUS
  Administered 2018-11-22: 3 mL via INTRAVENOUS

## 2018-11-21 MED ORDER — SODIUM CHLORIDE 0.9 % IV SOLN
1.0000 g | INTRAVENOUS | Status: DC
  Administered 2018-11-22: 1 g via INTRAVENOUS
  Filled 2018-11-21: qty 10

## 2018-11-21 MED ORDER — SODIUM CHLORIDE 0.9% FLUSH: 3.0000 mL | INTRAVENOUS | Status: DC | PRN

## 2018-11-21 MED ORDER — HYDRALAZINE HCL 20 MG/ML IJ SOLN
10.0000 mg | Freq: Four times a day (QID) | INTRAMUSCULAR | Status: DC
  Administered 2018-11-21: 10 mg via INTRAVENOUS
  Filled 2018-11-21: qty 1

## 2018-11-21 MED ORDER — LABETALOL HCL 5 MG/ML IV SOLN
20.0000 mg | Freq: Once | INTRAVENOUS | Status: AC
  Administered 2018-11-21: 20 mg via INTRAVENOUS
  Filled 2018-11-21: qty 4

## 2018-11-21 MED ORDER — HYDRALAZINE HCL 20 MG/ML IJ SOLN
10.0000 mg | Freq: Once | INTRAMUSCULAR | Status: AC
  Administered 2018-11-21: 10 mg via INTRAVENOUS
  Filled 2018-11-21: qty 1

## 2018-11-21 MED ORDER — TRAMADOL HCL 50 MG PO TABS: 50.0000 mg | ORAL_TABLET | Freq: Four times a day (QID) | ORAL | Status: DC | PRN

## 2018-11-21 MED ORDER — POTASSIUM CHLORIDE 10 MEQ/100ML IV SOLN
10.0000 meq | INTRAVENOUS | Status: AC
  Administered 2018-11-21 (×2): 10 meq via INTRAVENOUS
  Filled 2018-11-21 (×2): qty 100

## 2018-11-21 MED ORDER — HEPARIN SODIUM (PORCINE) 5000 UNIT/ML IJ SOLN
5000.0000 [IU] | Freq: Three times a day (TID) | INTRAMUSCULAR | Status: DC
  Administered 2018-11-21 – 2018-11-22 (×2): 5000 [IU] via SUBCUTANEOUS
  Filled 2018-11-21 (×2): qty 1

## 2018-11-21 MED ORDER — SODIUM CHLORIDE 0.9 % IV SOLN: 250.0000 mL | INTRAVENOUS | Status: DC | PRN

## 2018-11-21 MED ORDER — SENNOSIDES-DOCUSATE SODIUM 8.6-50 MG PO TABS
1.0000 | ORAL_TABLET | Freq: Two times a day (BID) | ORAL | Status: DC
  Administered 2018-11-22: 1 via ORAL
  Filled 2018-11-21: qty 1

## 2018-11-21 MED ORDER — ANASTROZOLE 1 MG PO TABS
1.0000 mg | ORAL_TABLET | Freq: Every day | ORAL | Status: DC
  Administered 2018-11-22: 1 mg via ORAL
  Filled 2018-11-21 (×2): qty 1

## 2018-11-21 MED ORDER — ACETAMINOPHEN 650 MG RE SUPP: 650.0000 mg | Freq: Four times a day (QID) | RECTAL | Status: DC | PRN

## 2018-11-21 MED ORDER — LABETALOL HCL 5 MG/ML IV SOLN
10.0000 mg | Freq: Four times a day (QID) | INTRAVENOUS | Status: DC
  Administered 2018-11-21: 10 mg via INTRAVENOUS
  Filled 2018-11-21: qty 4

## 2018-11-21 NOTE — ED Notes (Signed)
Pt is menstruating. 

## 2018-11-21 NOTE — Telephone Encounter (Signed)
Pt has been informed of results and expressed understanding.  °

## 2018-11-21 NOTE — ED Triage Notes (Signed)
Pt arrives from home complaining of bladder infection earlier this week. Pt states she started medication but began vomiting. Pt has been complaining of n/v since. Pt also states she has been unable to eat or drink for 3 days.

## 2018-11-21 NOTE — Telephone Encounter (Signed)
-----   Message from Biagio Borg, MD sent at 11/21/2018  9:20 AM EDT ----- Letter sent, cont same tx except  The test results show that your current treatment is OK, except the iron is low as we suspected, and the urine testing is consistent with infection.  The culture is pending, and we hope you are improving with the antibiotic.  We should ask you to restart the iron pills at twice per day, and make sure to follow up with Dr Alain Marion in 2 months to check this.  You should hear from the office about this.     Jasmine Stafford to please inform pt, please make appt for pt follow up with PCP, and ask if she feels she is getting better with the antibiotic, thanks

## 2018-11-21 NOTE — H&P (Addendum)
TRH H&P    Patient Demographics:    Jasmine Stafford, is a 53 y.o. female  MRN: [id number]  DOB - 12/15/1965  Admit Date - 11/21/2018  Referring MD/NP/PA:  Virgel Manifold  Outpatient Primary MD for the patient is Plotnikov, Evie Lacks, MD  Patient coming from: home  Chief complaint- nausea and vommitting   HPI:    Jasmine Stafford  is a 53 y.o. female,w hypertension, iron deficiency anemia, apparently presents with c/o n/v x 3 days, no bloody emesis.  Pt denies fever, chills, cough, cp, palp, sob, abd pain, diarrhea, brbpr, black stool.  Pt states has had slight dysuria, but no cva tenderness.  Pt seen yesterday by Dr. Jenny Reichmann and started on ? Bactrim, w/o relief. Pt has urinalysis yesterday w 3-6 wbc.    In ED,  T 99 P 81  Bp 166/98  Pox 97% on RA Wt 125 kg  Wbc 18.1, hgb 10.1, Plt 415 Na 136, K 3.3,  Bun 10, Creatinine 1.05 Ast 20, Alt 19, alk phos 67, T. Bili 1.0 Lipase 23  Urinalysis N +, LE negative RBC >50, WBC 0-5  Pt will be admitted for n/v, dysuria, hypokalemia, and iron deficiency anemia.        Review of systems:    In addition to the HPI above,  No Fever-chills, No Headache, No changes with Vision or hearing, No problems swallowing food or Liquids, No Chest pain, Cough or Shortness of Breath, No Abdominal pain, + constipation No Blood in stool  No dysuria, No new skin rashes or bruises, No new joints pains-aches,  No new weakness, tingling, numbness in any extremity, No recent weight gain or loss, No polyuria, polydypsia or polyphagia, No significant Mental Stressors.  All other systems reviewed and are negative.    Past History of the following :    Past Medical History:  Diagnosis Date  . Anemia   . Breast cancer ([place])   . Genital warts   . History of chicken pox   . HTN (hypertension)   . Status post blood transfusion without reported diagnosis 1998, 2002, 2005 x2       Past Surgical History:  Procedure Laterality Date  . CERVICAL CONE BIOPSY     cervical tumors  . MYOMECTOMY        Social History:      Social History   Tobacco Use  . Smoking status: Never Smoker  . Smokeless tobacco: Never Used  Substance Use Topics  . Alcohol use: No       Family History :     Family History  Problem Relation Age of Onset  . Colon cancer Other        MGM's maternal half sister  . Breast cancer Mother 58       also diagnosed at 65 and 80  . Diabetes Father   . Hypertension Father   . Prostate cancer Father 39  . Diabetes Brother   . Colon cancer Maternal Aunt   . Breast cancer Maternal Aunt 51  . Rectal  cancer Maternal Aunt   . Brain cancer Paternal Aunt        dx in her 22s  . Stomach cancer Paternal Uncle 40  . Lung cancer Maternal Grandfather 64  . Tuberculosis Maternal Grandfather   . Tuberculosis Maternal Grandmother   . Diabetes Paternal Grandmother   . Breast cancer Other 57       Maternal great grandmother  . Lung cancer Paternal Uncle 79  . Cancer Paternal Uncle        cancerous tumor on his back  . Breast cancer Cousin        paternal cousin dx in her 40s       Home Medications:   Prior to Admission medications   Medication Sig Start Date End Date Taking? Authorizing Provider  anastrozole (ARIMIDEX) 1 MG tablet TAKE 1 TABLET BY MOUTH EVERY DAY 10/22/18   Nicholas Lose, MD  Ascorbic Acid (VITAMIN C) 1000 MG tablet Take 3,000 mg by mouth daily.     [provider]  Calcium Carb-Cholecalciferol (CALCIUM 600 + D) 600-200 MG-UNIT TABS Take 1 tablet by mouth daily.    [provider]  Cholecalciferol (VITAMIN D3) 10000 UNITS capsule Take 10,000 Units by mouth daily.    [provider]  Coenzyme Q10 (COQ10) 400 MG CAPS Take 400 mg by mouth daily.    [provider]  DANDELION PO Take 2 capsules by mouth daily.     [provider]  Digestive Enzymes CAPS Take 2 capsules by mouth 3  (three) times daily with meals.    [provider]  doxycycline (VIBRA-TABS) 100 MG tablet Take 1 tablet (100 mg total) by mouth every 12 (twelve) hours. 08/18/17   Regalado, Belkys A, MD  ferrous gluconate (FERGON) 325 MG tablet Take 1 tablet (325 mg total) by mouth 3 (three) times daily with meals. 08/18/17   Regalado, Belkys A, MD  GARLIC PO Take 3 capsules by mouth daily.    [provider]  guaiFENesin (ROBITUSSIN) 100 MG/5ML SOLN Take 20 mLs (400 mg total) by mouth every 4 (four) hours as needed for cough or to loosen phlegm. 08/18/17   Regalado, Belkys A, MD  Guaifenesin 1200 MG TB12 Take 1,200 mg by mouth 2 (two) times daily as needed (cough).    [provider]  hydrALAZINE (APRESOLINE) 100 MG tablet Take 100 mg by mouth 3 (three) times daily.     [provider]  hydrochlorothiazide (HYDRODIURIL) 25 MG tablet Take 25 mg by mouth daily with lunch.     [provider]  ipratropium-albuterol (DUONEB) 0.5-2.5 (3) MG/3ML SOLN Take 3 mLs by nebulization 2 (two) times daily. 08/18/17   Regalado, Belkys A, MD  labetalol (NORMODYNE) 300 MG tablet Take 600 mg by mouth 2 (two) times daily.     [provider]  Lactobacillus (PROBIOTIC ACIDOPHILUS PO) Take 2 tablets by mouth daily.    [provider]  Melatonin 10 MG TABS Take 20 mg by mouth at bedtime as needed (sleep).     [provider]  meloxicam (MOBIC) 7.5 MG tablet Take 1 tablet (7.5 mg total) by mouth daily. 01/10/18   Zigmund Gottron, NP  Multiple Vitamin (MULTIVITAMIN WITH MINERALS) TABS tablet Take 1 tablet by mouth at bedtime.    [provider]  Multiple Vitamins-Iron (CHLORELLA PO) Take 4 g by mouth daily.    [provider]  ondansetron (ZOFRAN) 4 MG tablet Take 1 tablet (4 mg total) by mouth every 8 (  eight) hours as needed for nausea or vomiting. 11/20/18   Biagio Borg, MD  OVER THE COUNTER MEDICATION Take 2 capsules by mouth daily. Dim Supreme  vegetarian capsules (from cruciferous vegetables)    [provider]  polyethylene glycol powder (GLYCOLAX/MIRALAX) 17 GM/SCOOP powder Take 17 g by mouth 2 (two) times daily as needed. 11/20/18   Biagio Borg, MD  senna-docusate (SENOKOT-S) 8.6-50 MG tablet Take 1 tablet by mouth 2 (two) times daily. 08/18/17   Regalado, Belkys A, MD  sulfamethoxazole-trimethoprim (BACTRIM DS) 800-160 MG tablet Take 1 tablet by mouth 2 (two) times daily. 11/20/18   Biagio Borg, MD  traMADol (ULTRAM) 50 MG tablet Take 1 tablet (50 mg total) by mouth every 6 (six) hours as needed. 11/20/18   Biagio Borg, MD  Turmeric POWD Take 7.5 mLs by mouth at bedtime.     [provider]     Allergies:     Allergies  Allergen Reactions  . Other Hives    Reaction to Tomatoes & Mushrooms  . Shellfish Allergy Hives  . Erythromycin Hives  . Moxifloxacin Hives  . Penicillins Hives    Has patient had a PCN reaction causing immediate rash, facial/tongue/throat swelling, SOB or lightheadedness with hypotension: Yes Has patient had a PCN reaction causing severe rash involving mucus membranes or skin necrosis: No Has patient had a PCN reaction that required hospitalization: No Has patient had a PCN reaction occurring within the last 10 years: No If all of the above answers are "NO", then may proceed with Cephalosporin use.  Flo Shanks [Gatifloxacin] Hives     Physical Exam:   Vitals  Blood pressure (!) 214/90, pulse 86, temperature 98.1 F (36.7 C), resp. rate 20, height '5\' 4"'  (1.626 m), weight 125 kg, SpO2 94 %.  1.  General: axox3  2. Psychiatric: euthymic  3. Neurologic: cn2-12 intact, reflexes 2+ symmetric, diffuse with no clonus,  Motor 5/5 in all 4 ext  4. HEENMT:  Anicteric , pupils 1.29m symmetric, direct , consensual, near intact  5. Respiratory : CTAB  6. Cardiovascular : rrr s1, s2, no m/g/r  7. Gastrointestinal:  Abd: soft, obese, nt, nd, +bs No CVA tenderness  8. Skin:   Ext: no c/c/e, no rash, no cervical adenopathy  9.Musculoskeletal:  Good ROM     Data Review:    CBC Recent Labs  Lab 11/20/18 1115 11/21/18 1704  WBC 16.1* 18.1*  HGB 10.2* 10.1*  HCT 31.6* 32.8*  PLT 368.0 415*  MCV 79.2 81.4  MCH  --  25.1*  MCHC 32.2 30.8  RDW 18.0* 17.2*  LYMPHSABS 1.1 1.4  MONOABS 1.0 1.1*  EOSABS 0.0 0.0  BASOSABS 0.0 0.0   ------------------------------------------------------------------------------------------------------------------  Results for orders placed or performed during the hospital encounter of 11/21/18 (from the past 48 hour(s))  CBC with Differential     Status: Abnormal   Collection Time: 11/21/18  5:04 PM  Result Value Ref Range   WBC 18.1 (H) 4.0 - 10.5 K/uL   RBC 4.03 3.87 - 5.11 MIL/uL   Hemoglobin 10.1 (L) 12.0 - 15.0 g/dL   HCT 32.8 (L) 36.0 - 46.0 %   MCV 81.4 80.0 - 100.0 fL   MCH 25.1 (L) 26.0 - 34.0 pg   MCHC 30.8 30.0 - 36.0 g/dL   RDW 17.2 (H) 11.5 - 15.5 %   Platelets 415 (H) 150 - 400 K/uL   nRBC 0.0 0.0 - 0.2 %   Neutrophils Relative %  85 %   Neutro Abs 15.4 (H) 1.7 - 7.7 K/uL   Lymphocytes Relative 8 %   Lymphs Abs 1.4 0.7 - 4.0 K/uL   Monocytes Relative 6 %   Monocytes Absolute 1.1 (H) 0.1 - 1.0 K/uL   Eosinophils Relative 0 %   Eosinophils Absolute 0.0 0.0 - 0.5 K/uL   Basophils Relative 0 %   Basophils Absolute 0.0 0.0 - 0.1 K/uL   Immature Granulocytes 1 %   Abs Immature Granulocytes 0.10 (H) 0.00 - 0.07 K/uL    Comment: Performed at [facility] [address] [place], [place] [address]  Comprehensive metabolic panel     Status: Abnormal   Collection Time: 11/21/18  5:04 PM  Result Value Ref Range   Sodium 136 135 - 145 mmol/L   Potassium 3.3 (L) 3.5 - 5.1 mmol/L   Chloride 99 98 - 111 mmol/L   CO2 23 22 - 32 mmol/L   Glucose, Bld 112 (H) 70 - 99 mg/dL   BUN 10 6 - 20 mg/dL   Creatinine, Ser 1.05 (H) 0.44 - 1.00 mg/dL   Calcium 8.9 8.9 - 10.3 mg/dL   Total Protein 8.2 (H) 6.5 -  8.1 g/dL   Albumin 3.6 3.5 - 5.0 g/dL   AST 20 15 - 41 U/L   ALT 19 0 - 44 U/L   Alkaline Phosphatase 67 38 - 126 U/L   Total Bilirubin 1.0 0.3 - 1.2 mg/dL   GFR calc non Af Amer >60 >60 mL/min   GFR calc Af Amer >60 >60 mL/min   Anion gap 14 5 - 15    Comment: Performed at [facility] [address], [place], [place] [address]  Lipase, blood     Status: None   Collection Time: 11/21/18  5:04 PM  Result Value Ref Range   Lipase 23 11 - 51 U/L    Comment: Performed at [facility] [address] [place], [place] [address]  Urinalysis, Routine w reflex microscopic     Status: Abnormal   Collection Time: 11/21/18  5:20 PM  Result Value Ref Range   Color, Urine AMBER (A) YELLOW    Comment: BIOCHEMICALS MAY BE AFFECTED BY COLOR   APPearance CLEAR CLEAR   Specific Gravity, Urine 1.018 1.005 - 1.030   pH 6.0 5.0 - 8.0   Glucose, UA NEGATIVE NEGATIVE mg/dL   Hgb urine dipstick LARGE (A) NEGATIVE   Bilirubin Urine NEGATIVE NEGATIVE   Ketones, ur 20 (A) NEGATIVE mg/dL   Protein, ur 100 (A) NEGATIVE mg/dL   Nitrite POSITIVE (A) NEGATIVE   Leukocytes,Ua NEGATIVE NEGATIVE   RBC / HPF >50 (H) 0 - 5 RBC/hpf   WBC, UA 0-5 0 - 5 WBC/hpf   Bacteria, UA FEW (A) NONE SEEN   Mucus PRESENT     Comment: Performed at [facility], [place] [address] [place], [place] [address]    Chemistries  Recent Labs  Lab 11/20/18 1115 11/21/18 1704  NA 137 136  K 3.4* 3.3*  CL 100 99  CO2 28 23  GLUCOSE 104* 112*  BUN 14 10  CREATININE 1.04 1.05*  CALCIUM 8.6 8.9  AST 14 20  ALT 17 19  ALKPHOS 62 67  BILITOT 0.8 1.0   ------------------------------------------------------------------------------------------------------------------  ------------------------------------------------------------------------------------------------------------------ GFR: Estimated Creatinine Clearance: 82.9 mL/min (A) (by C-G formula based on SCr of 1.05 mg/dL (H)). Liver Function Tests: Recent  Labs  Lab 11/20/18 1115 11/21/18 1704  AST 14 20  ALT 17 19  ALKPHOS 62 67  BILITOT 0.8 1.0  PROT 7.7 8.2*  ALBUMIN 3.9 3.6   Recent Labs  Lab 11/20/18 1115 11/21/18 1704  LIPASE 6.0* 23   No results for input(s): AMMONIA in the last 168 hours. Coagulation Profile: No results for input(s): INR, PROTIME in the last 168 hours. Cardiac Enzymes: No results for input(s): CKTOTAL, CKMB, CKMBINDEX, TROPONINI in the last 168 hours. BNP (last 3 results) No results for input(s): PROBNP in the last 8760 hours. HbA1C: No results for input(s): HGBA1C in the last 72 hours. CBG: No results for input(s): GLUCAP in the last 168 hours. Lipid Profile: No results for input(s): CHOL, HDL, LDLCALC, TRIG, CHOLHDL, LDLDIRECT in the last 72 hours. Thyroid Function Tests: No results for input(s): TSH, T4TOTAL, FREET4, T3FREE, THYROIDAB in the last 72 hours. Anemia Panel: Recent Labs    11/20/18 1115  IRON 10*    --------------------------------------------------------------------------------------------------------------- Urine analysis:    Component Value Date/Time   COLORURINE AMBER (A) 11/21/2018 1720   APPEARANCEUR CLEAR 11/21/2018 1720   LABSPEC 1.018 11/21/2018 1720   PHURINE 6.0 11/21/2018 1720   GLUCOSEU NEGATIVE 11/21/2018 1720   GLUCOSEU 100 (A) 11/20/2018 1115   HGBUR LARGE (A) 11/21/2018 1720   BILIRUBINUR NEGATIVE 11/21/2018 1720   KETONESUR 20 (A) 11/21/2018 1720   PROTEINUR 100 (A) 11/21/2018 1720   UROBILINOGEN 4.0 (A) 11/20/2018 1115   NITRITE POSITIVE (A) 11/21/2018 1720   LEUKOCYTESUR NEGATIVE 11/21/2018 1720      Imaging Results:    No results found.  EKG nsr at 70, nl axis, nl int, biphasic t in v2-5   Assessment & Plan:    Principal Problem:   Nausea & vomiting Active Problems:   Essential hypertension   Hypokalemia   Dysuria  Nausea and vomitting Check CXR Check Trop zofran 88m iv q6h prn    Dysuria Rocephin 1gm iv qday (first dose was  given in ED without issues)  Microscopic hematuria Check CT scan abd/ pelvis  Hypokalemia likely due to n/v, ddx hydrochlorothiazide Replete Check cmp in am  Hypertension uncontrolled Labetalol 123miv q6h Hydralazine 1011mv q6h  Hydralazine 40m7m q6h prn sbp >160  Iron deficiency anemia Cont ferrous sulfate Please make sure follows up with pcp for further w/up, ie colonoscopy  H/o Breast cancer Cont Arimidex      DVT Prophylaxis-   heparin- SCDs   AM Labs Ordered, also please review Full Orders  Family Communication: Admission, patients condition and plan of care including tests being ordered have been discussed with the patient  who indicate understanding and agree with the plan and Code Status.  Code Status:   FULL CODE  Admission status: Observation: Based on patients clinical presentation and evaluation of above clinical data, I have made determination that patient meets observation criteria at this time.  Due to inability to tolerate oral abx for UTI and uncontrolled bp requiring iv labetalol as well as iv hydralazine to treat sbp 214  Time spent in minutes : 70 minutes   JameJani Gravel on 11/21/2018 at 7:52 PM

## 2018-11-21 NOTE — ED Notes (Signed)
Continues to have emesis after ice chips challenge.

## 2018-11-21 NOTE — ED Notes (Signed)
ED TO INPATIENT HANDOFF REPORT  ED Nurse Name and Phone #: Nicki Reaper [phone]  S Name/Age/Gender Jasmine Stafford 53 y.o. female Room/Bed: 031C/031C  Code Status   Code Status: Prior  Home/SNF/Other Home Patient oriented to: self, place, time and situation Is this baseline? Yes   Triage Complete: Triage complete  Chief Complaint Bladder Infection; Dehydration; N/V  Triage Note Pt arrives from home complaining of bladder infection earlier this week. Pt states she started medication but began vomiting. Pt has been complaining of n/v since. Pt also states she has been unable to eat or drink for 3 days.   Allergies Allergies  Allergen Reactions  . Other Hives    Reaction to Tomatoes & Mushrooms  . Shellfish Allergy Hives  . Erythromycin Hives  . Moxifloxacin Hives  . Penicillins Hives    Has patient had a PCN reaction causing immediate rash, facial/tongue/throat swelling, SOB or lightheadedness with hypotension: Yes Has patient had a PCN reaction causing severe rash involving mucus membranes or skin necrosis: No Has patient had a PCN reaction that required hospitalization: No Has patient had a PCN reaction occurring within the last 10 years: No If all of the above answers are "NO", then may proceed with Cephalosporin use.  Flo Shanks [Gatifloxacin] Hives    Level of Care/Admitting Diagnosis ED Disposition    ED Disposition Condition Comment   Admit  Hospital Area: [facility] [[id number]]  Level of Care: Telemetry Medical [104]  I expect the patient will be discharged within 24 hours: No (not a candidate for 5C-Observation unit)  Covid Evaluation: N/A  Diagnosis: Nausea & vomiting [856314]  Admitting Physician: Jani Gravel [[id number]]  Attending Physician: Jani Gravel [[id number]]  PT Class (Do Not Modify): Observation [104]  PT Acc Code (Do Not Modify): Observation [10022]       B Medical/Surgery History Past Medical History:  Diagnosis Date  . Anemia   . Breast  cancer ([place])   . Genital warts   . History of chicken pox   . HTN (hypertension)   . Status post blood transfusion without reported diagnosis 1998, 2002, 2005 x2   Past Surgical History:  Procedure Laterality Date  . CERVICAL CONE BIOPSY     cervical tumors  . MYOMECTOMY       A IV Location/Drains/Wounds Patient Lines/Drains/Airways Status   Active Line/Drains/Airways    Name:   Placement date:   Placement time:   Site:   Days:   Peripheral IV 11/21/18 Right;Lateral Antecubital   11/21/18    1706    Antecubital   less than 1          Intake/Output Last 24 hours  Intake/Output Summary (Last 24 hours) at 11/21/2018 1959 Last data filed at 11/21/2018 1822 Gross per 24 hour  Intake 150 ml  Output -  Net 150 ml    Labs/Imaging Results for orders placed or performed during the hospital encounter of 11/21/18 (from the past 48 hour(s))  CBC with Differential     Status: Abnormal   Collection Time: 11/21/18  5:04 PM  Result Value Ref Range   WBC 18.1 (H) 4.0 - 10.5 K/uL   RBC 4.03 3.87 - 5.11 MIL/uL   Hemoglobin 10.1 (L) 12.0 - 15.0 g/dL   HCT 32.8 (L) 36.0 - 46.0 %   MCV 81.4 80.0 - 100.0 fL   MCH 25.1 (L) 26.0 - 34.0 pg   MCHC 30.8 30.0 - 36.0 g/dL   RDW 17.2 (H) 11.5 - 15.5 %  Platelets 415 (H) 150 - 400 K/uL   nRBC 0.0 0.0 - 0.2 %   Neutrophils Relative % 85 %   Neutro Abs 15.4 (H) 1.7 - 7.7 K/uL   Lymphocytes Relative 8 %   Lymphs Abs 1.4 0.7 - 4.0 K/uL   Monocytes Relative 6 %   Monocytes Absolute 1.1 (H) 0.1 - 1.0 K/uL   Eosinophils Relative 0 %   Eosinophils Absolute 0.0 0.0 - 0.5 K/uL   Basophils Relative 0 %   Basophils Absolute 0.0 0.0 - 0.1 K/uL   Immature Granulocytes 1 %   Abs Immature Granulocytes 0.10 (H) 0.00 - 0.07 K/uL    Comment: Performed at [facility] [address], [place], [place] [address]  Comprehensive metabolic panel     Status: Abnormal   Collection Time: 11/21/18  5:04 PM  Result Value Ref Range   Sodium 136 135 - 145  mmol/L   Potassium 3.3 (L) 3.5 - 5.1 mmol/L   Chloride 99 98 - 111 mmol/L   CO2 23 22 - 32 mmol/L   Glucose, Bld 112 (H) 70 - 99 mg/dL   BUN 10 6 - 20 mg/dL   Creatinine, Ser 1.05 (H) 0.44 - 1.00 mg/dL   Calcium 8.9 8.9 - 10.3 mg/dL   Total Protein 8.2 (H) 6.5 - 8.1 g/dL   Albumin 3.6 3.5 - 5.0 g/dL   AST 20 15 - 41 U/L   ALT 19 0 - 44 U/L   Alkaline Phosphatase 67 38 - 126 U/L   Total Bilirubin 1.0 0.3 - 1.2 mg/dL   GFR calc non Af Amer >60 >60 mL/min   GFR calc Af Amer >60 >60 mL/min   Anion gap 14 5 - 15    Comment: Performed at [facility] [address], [place], [place] [address]  Lipase, blood     Status: None   Collection Time: 11/21/18  5:04 PM  Result Value Ref Range   Lipase 23 11 - 51 U/L    Comment: Performed at [facility] [address] [place], [place] [address]  Urinalysis, Routine w reflex microscopic     Status: Abnormal   Collection Time: 11/21/18  5:20 PM  Result Value Ref Range   Color, Urine AMBER (A) YELLOW    Comment: BIOCHEMICALS MAY BE AFFECTED BY COLOR   APPearance CLEAR CLEAR   Specific Gravity, Urine 1.018 1.005 - 1.030   pH 6.0 5.0 - 8.0   Glucose, UA NEGATIVE NEGATIVE mg/dL   Hgb urine dipstick LARGE (A) NEGATIVE   Bilirubin Urine NEGATIVE NEGATIVE   Ketones, ur 20 (A) NEGATIVE mg/dL   Protein, ur 100 (A) NEGATIVE mg/dL   Nitrite POSITIVE (A) NEGATIVE   Leukocytes,Ua NEGATIVE NEGATIVE   RBC / HPF >50 (H) 0 - 5 RBC/hpf   WBC, UA 0-5 0 - 5 WBC/hpf   Bacteria, UA FEW (A) NONE SEEN   Mucus PRESENT     Comment: Performed at [facility], [place] [address], [place], [place] [address]   No results found.  Pending Labs Unresulted Labs (From admission, onward)    Start     Ordered   11/21/18 1652  Urine culture  ONCE - STAT,   STAT     11/21/18 1652          Vitals/Pain Today's Vitals   11/21/18 1741 11/21/18 1900 11/21/18 1915 11/21/18 1925  BP:  (!) 224/93 (!) 214/90   Pulse:  84  86   Resp:  (!) 21 20   Temp:       SpO2:  94% 94%   Weight:      Height:      PainSc: 0-No pain   Asleep    Isolation Precautions No active isolations  Medications Medications  potassium chloride 10 mEq in 100 mL IVPB (10 mEq Intravenous New Bag/Given 11/21/18 1952)  hydrALAZINE (APRESOLINE) injection 10 mg (10 mg Intravenous Given 11/21/18 1951)  sodium chloride 0.9 % bolus 1,000 mL (1,000 mLs Intravenous New Bag/Given 11/21/18 1727)  ondansetron (ZOFRAN) injection 4 mg (4 mg Intravenous Given 11/21/18 1730)  cefTRIAXone (ROCEPHIN) 1 g in sodium chloride 0.9 % 100 mL IVPB (0 g Intravenous Stopped 11/21/18 1822)  labetalol (NORMODYNE) injection 20 mg (20 mg Intravenous Given 11/21/18 1732)  hydrALAZINE (APRESOLINE) injection 10 mg (10 mg Intravenous Given 11/21/18 1734)  droperidol (INAPSINE) 2.5 MG/ML injection 2.5 mg (2.5 mg Intravenous Given 11/21/18 1829)    Mobility walks Low fall risk   Focused Assessments    R Recommendations: See Admitting Provider Note  Report given to:   Additional Notes:

## 2018-11-21 NOTE — ED Provider Notes (Signed)
[facility]ER[facility] DEPARTMENT Provider Note   CSN: [id number] Arrival date & time: 11/21/18  1618    History   Chief Complaint Chief Complaint  Patient presents with  . Nausea  . Emesis  . Fever    HPI DELANIA FERG is a 53 y.o. female.     HPI   53 year old female with nausea, vomiting and abdominal pain.  She reports symptoms of "a bladder infection."  Onset 3 to 4 days ago.  She is taking Azo without improvement.  She had a appointment with her PCPs office yesterday.  She was prescribed Bactrim.  She is tried taking 2 doses at this point but subsequently vomited shortly after taking either 1.  She has been having some pain/pressure across the lower abdomen.  Does not lateralize.  Subjective fevers.  Respiratory complaints.  Past Medical History:  Diagnosis Date  . Anemia   . Breast cancer ([place])   . Genital warts   . History of chicken pox   . HTN (hypertension)   . Status post blood transfusion without reported diagnosis 1998, 2002, 2005 x2    Patient Active Problem List   Diagnosis Date Noted  . Iron deficiency anemia 11/20/2018  . Dysuria 11/20/2018  . Hypertensive urgency 08/13/2017  . CAP (community acquired pneumonia) 08/13/2017  . Intractable cyclical vomiting with nausea   . Cancer of upper-outer quadrant of female breast ([place]) 08/15/2012  . Acute kidney injury ([place]) 04/01/2012  . Hypokalemia 04/01/2012  . Dehydration 04/01/2012  . Gastroenteritis 04/01/2012  . Obesity (BMI 35.0-39.9 without comorbidity) 04/01/2012  . Constipation 04/01/2012  . Microcytic anemia 04/01/2012  . Seromucinous otitis media 10/21/2010  . Hypertrophy of tonsil 10/21/2010  . BACK PAIN, THORACIC REGION 06/29/2010  . Essential hypertension 05/21/2010  . BACK PAIN 05/21/2010    Past Surgical History:  Procedure Laterality Date  . CERVICAL CONE BIOPSY     cervical tumors  . MYOMECTOMY       OB History   No obstetric history on file.       Home Medications    Prior to Admission medications   Medication Sig Start Date End Date Taking? Authorizing Provider  anastrozole (ARIMIDEX) 1 MG tablet TAKE 1 TABLET BY MOUTH EVERY DAY 10/22/18   Nicholas Lose, MD  Ascorbic Acid (VITAMIN C) 1000 MG tablet Take 3,000 mg by mouth daily.     [provider]  Calcium Carb-Cholecalciferol (CALCIUM 600 + D) 600-200 MG-UNIT TABS Take 1 tablet by mouth daily.    [provider]  Cholecalciferol (VITAMIN D3) 10000 UNITS capsule Take 10,000 Units by mouth daily.    [provider]  Coenzyme Q10 (COQ10) 400 MG CAPS Take 400 mg by mouth daily.    [provider]  DANDELION PO Take 2 capsules by mouth daily.     [provider]  Digestive Enzymes CAPS Take 2 capsules by mouth 3 (three) times daily with meals.    [provider]  doxycycline (VIBRA-TABS) 100 MG tablet Take 1 tablet (100 mg total) by mouth every 12 (twelve) hours. 08/18/17   Regalado, Belkys A, MD  ferrous gluconate (FERGON) 325 MG tablet Take 1 tablet (325 mg total) by mouth 3 (three) times daily with meals. 08/18/17   Regalado, Belkys A, MD  GARLIC PO Take 3 capsules by mouth daily.    [provider]  guaiFENesin (ROBITUSSIN) 100 MG/5ML SOLN Take 20 mLs (400 mg total) by mouth every 4 (four) hours as needed  for cough or to loosen phlegm. 08/18/17   Regalado, Belkys A, MD  Guaifenesin 1200 MG TB12 Take 1,200 mg by mouth 2 (two) times daily as needed (cough).    [provider]  hydrALAZINE (APRESOLINE) 100 MG tablet Take 100 mg by mouth 3 (three) times daily.     [provider]  hydrochlorothiazide (HYDRODIURIL) 25 MG tablet Take 25 mg by mouth daily with lunch.     [provider]  ipratropium-albuterol (DUONEB) 0.5-2.5 (3) MG/3ML SOLN Take 3 mLs by nebulization 2 (two) times daily. 08/18/17   Regalado, Belkys A, MD  labetalol (NORMODYNE) 300 MG tablet Take 600 mg by mouth 2 (two) times daily.      [provider]  Lactobacillus (PROBIOTIC ACIDOPHILUS PO) Take 2 tablets by mouth daily.    [provider]  Melatonin 10 MG TABS Take 20 mg by mouth at bedtime as needed (sleep).     [provider]  meloxicam (MOBIC) 7.5 MG tablet Take 1 tablet (7.5 mg total) by mouth daily. 01/10/18   Zigmund Gottron, NP  Multiple Vitamin (MULTIVITAMIN WITH MINERALS) TABS tablet Take 1 tablet by mouth at bedtime.    [provider]  Multiple Vitamins-Iron (CHLORELLA PO) Take 4 g by mouth daily.    [provider]  ondansetron (ZOFRAN) 4 MG tablet Take 1 tablet (4 mg total) by mouth every 8 (eight) hours as needed for nausea or vomiting. 11/20/18   Biagio Borg, MD  OVER THE COUNTER MEDICATION Take 2 capsules by mouth daily. Dim Supreme vegetarian capsules (from cruciferous vegetables)    [provider]  polyethylene glycol powder (GLYCOLAX/MIRALAX) 17 GM/SCOOP powder Take 17 g by mouth 2 (two) times daily as needed. 11/20/18   Biagio Borg, MD  senna-docusate (SENOKOT-S) 8.6-50 MG tablet Take 1 tablet by mouth 2 (two) times daily. 08/18/17   Regalado, Belkys A, MD  sulfamethoxazole-trimethoprim (BACTRIM DS) 800-160 MG tablet Take 1 tablet by mouth 2 (two) times daily. 11/20/18   Biagio Borg, MD  traMADol (ULTRAM) 50 MG tablet Take 1 tablet (50 mg total) by mouth every 6 (six) hours as needed. 11/20/18   Biagio Borg, MD  Turmeric POWD Take 7.5 mLs by mouth at bedtime.     [provider]    Family History Family History  Problem Relation Age of Onset  . Colon cancer Other        MGM's maternal half sister  . Breast cancer Mother 85       also diagnosed at 19 and 58  . Diabetes Father   . Hypertension Father   . Prostate cancer Father 2  . Diabetes Brother   . Colon cancer Maternal Aunt   . Breast cancer Maternal Aunt 51  . Rectal cancer Maternal Aunt   . Brain cancer Paternal Aunt        dx in her 104s  . Stomach cancer Paternal Uncle  95  . Lung cancer Maternal Grandfather 65  . Tuberculosis Maternal Grandfather   . Tuberculosis Maternal Grandmother   . Diabetes Paternal Grandmother   . Breast cancer Other 40       Maternal great grandmother  . Lung cancer Paternal Uncle 68  . Cancer Paternal Uncle        cancerous tumor on his back  . Breast cancer Cousin        paternal cousin dx in her 74s    Social History Social History   Tobacco  Use  . Smoking status: Never Smoker  . Smokeless tobacco: Never Used  Substance Use Topics  . Alcohol use: No  . Drug use: No     Allergies   Other; Shellfish allergy; Erythromycin; Moxifloxacin; Penicillins; and Tequin [gatifloxacin]   Review of Systems Review of Systems  All systems reviewed and negative, other than as noted in HPI.  Physical Exam Updated Vital Signs BP (!) 214/90   Pulse 86   Temp 98.1 F (36.7 C)   Resp 20   Ht 5\' 4"  (1.626 m)   Wt 125 kg   SpO2 94%   BMI 47.30 kg/m   Physical Exam Vitals signs and nursing note reviewed.  Constitutional:      Appearance: She is well-developed. She is obese.     Comments: Peers tired, but not toxic.  HENT:     Head: Normocephalic and atraumatic.  Eyes:     General:        Right eye: No discharge.        Left eye: No discharge.     Conjunctiva/sclera: Conjunctivae normal.  Neck:     Musculoskeletal: Neck supple.  Cardiovascular:     Rate and Rhythm: Normal rate and regular rhythm.     Heart sounds: Normal heart sounds. No murmur. No friction rub. No gallop.   Pulmonary:     Effort: Pulmonary effort is normal. No respiratory distress.     Breath sounds: Normal breath sounds.  Abdominal:     General: There is no distension.     Palpations: Abdomen is soft.     Tenderness: There is no abdominal tenderness.     Comments: Numbness across the lower abdomen without rebound or guarding.  Musculoskeletal:        General: No tenderness.  Skin:    General: Skin is warm and dry.  Neurological:      Mental Status: She is alert.  Psychiatric:        Behavior: Behavior normal.        Thought Content: Thought content normal.      ED Treatments / Results  Labs (all labs ordered are listed, but only abnormal results are displayed) Labs Reviewed  URINALYSIS, ROUTINE W REFLEX MICROSCOPIC - Abnormal; Notable for the following components:      Result Value   Color, Urine AMBER (*)    Hgb urine dipstick LARGE (*)    Ketones, ur 20 (*)    Protein, ur 100 (*)    Nitrite POSITIVE (*)    RBC / HPF >50 (*)    Bacteria, UA FEW (*)    All other components within normal limits  CBC WITH DIFFERENTIAL/PLATELET - Abnormal; Notable for the following components:   WBC 18.1 (*)    Hemoglobin 10.1 (*)    HCT 32.8 (*)    MCH 25.1 (*)    RDW 17.2 (*)    Platelets 415 (*)    Neutro Abs 15.4 (*)    Monocytes Absolute 1.1 (*)    Abs Immature Granulocytes 0.10 (*)    All other components within normal limits  COMPREHENSIVE METABOLIC PANEL - Abnormal; Notable for the following components:   Potassium 3.3 (*)    Glucose, Bld 112 (*)    Creatinine, Ser 1.05 (*)    Total Protein 8.2 (*)    All other components within normal limits  CREATININE, SERUM - Abnormal; Notable for the following components:   Creatinine, Ser 1.08 (*)    GFR calc non  Af Amer 59 (*)    All other components within normal limits  COMPREHENSIVE METABOLIC PANEL - Abnormal; Notable for the following components:   Potassium 3.2 (*)    Glucose, Bld 110 (*)    Calcium 8.5 (*)    Albumin 3.4 (*)    All other components within normal limits  CBC - Abnormal; Notable for the following components:   WBC 15.2 (*)    RBC 3.71 (*)    Hemoglobin 9.5 (*)    HCT 29.8 (*)    MCH 25.6 (*)    RDW 17.3 (*)    Platelets 415 (*)    All other components within normal limits  TROPONIN I - Abnormal; Notable for the following components:   Troponin I 0.05 (*)    All other components within normal limits  CK TOTAL AND CKMB (NOT AT [facility]) -  Abnormal; Notable for the following components:   Total CK 240 (*)    All other components within normal limits  TROPONIN I - Abnormal; Notable for the following components:   Troponin I 0.05 (*)    All other components within normal limits  TROPONIN I - Abnormal; Notable for the following components:   Troponin I 0.05 (*)    All other components within normal limits  LIPID PANEL - Abnormal; Notable for the following components:   Cholesterol 205 (*)    LDL Cholesterol 136 (*)    All other components within normal limits  URINE CULTURE  LIPASE, BLOOD  HIV ANTIBODY (ROUTINE TESTING W REFLEX)    EKG EKG Interpretation  Date/Time:  Thursday November 21 2018 16:49:29 EDT Ventricular Rate:  69 PR Interval:    QRS Duration: 95 QT Interval:  450 QTC Calculation: 483 R Axis:   25 Text Interpretation:  Sinus or ectopic atrial rhythm Nonspecific T abnormalities, lateral leads No significant change since last tracing Confirmed by Duffy Bruce [phone]) on 11/22/2018 9:30:23 AM   Radiology Ct Abdomen Pelvis Wo Contrast  Result Date: 11/21/2018 CLINICAL DATA:  Hematuria EXAM: CT ABDOMEN AND PELVIS WITHOUT CONTRAST TECHNIQUE: Multidetector CT imaging of the abdomen and pelvis was performed following the standard protocol without IV contrast. COMPARISON:  07/28/2014 FINDINGS: Lower chest: no acute abnormality Hepatobiliary: No focal hepatic abnormality. Gallbladder unremarkable. Pancreas: No focal abnormality or ductal dilatation. Spleen: No focal abnormality.  Normal size. Adrenals/Urinary Tract: No adrenal abnormality. No focal renal abnormality. No stones or hydronephrosis. Urinary bladder is unremarkable. Stomach/Bowel: Normal appendix. Stomach, large and small bowel grossly unremarkable. Vascular/Lymphatic: No evidence of aneurysm or adenopathy. Reproductive: Calcified fibroid centrally in the uterus. No adnexal mass. Other: No free fluid or free air. Musculoskeletal: No acute bony abnormality.  IMPRESSION: No renal or ureteral stones.  No hydronephrosis. No acute findings in the abdomen or pelvis. Calcified uterine fibroid. Electronically Signed   By: Rolm Baptise M.D.   On: 11/21/2018 21:23   Dg Chest 2 View  Result Date: 11/22/2018 CLINICAL DATA:  Nausea and vomiting.  History of breast carcinoma EXAM: CHEST - 2 VIEW COMPARISON:  August 17, 2017. FINDINGS: There is no edema or consolidation. Heart is borderline enlarged with pulmonary vascularity normal. No adenopathy. No bone lesions. IMPRESSION: Borderline cardiac prominence. No edema or consolidation. No evident adenopathy. Electronically Signed   By: Lowella Grip III M.D.   On: 11/22/2018 08:07    Procedures Procedures (including critical care time)  Medications Ordered in ED Medications  potassium chloride 10 mEq in 100 mL IVPB (0 mEq Intravenous Stopped  11/21/18 1929)  sodium chloride 0.9 % bolus 1,000 mL (1,000 mLs Intravenous New Bag/Given 11/21/18 1727)  ondansetron (ZOFRAN) injection 4 mg (4 mg Intravenous Given 11/21/18 1730)  cefTRIAXone (ROCEPHIN) 1 g in sodium chloride 0.9 % 100 mL IVPB (0 g Intravenous Stopped 11/21/18 1822)  labetalol (NORMODYNE) injection 20 mg (20 mg Intravenous Given 11/21/18 1732)  hydrALAZINE (APRESOLINE) injection 10 mg (10 mg Intravenous Given 11/21/18 1734)  droperidol (INAPSINE) 2.5 MG/ML injection 2.5 mg (2.5 mg Intravenous Given 11/21/18 1829)     Initial Impression / Assessment and Plan / ED Course  I have reviewed the triage vital signs and the nursing notes.  Pertinent labs & imaging results that were available during my care of the patient were reviewed by me and considered in my medical decision making (see chart for details).    53 year old female with symptoms and urinalysis consistent with UTI.  Prescribed Bactrim yesterday but has been unable to tolerate her antibiotics because of persistent nausea and vomiting.  She is given a dose of Zofran and droperidol here in the  emergency room.  Feeling somewhat better but still fairly symptomatic.  Noted to be very hypertensive.  She has been having up to keep her antihypertensive medications down.  Will admit for ongoing symptomatic treatment.  Final Clinical Impressions(s) / ED Diagnoses   Final diagnoses:  Acute cystitis with hematuria  Nausea and vomiting, intractability of vomiting not specified, unspecified vomiting type    ED Discharge Orders    None       Virgel Manifold, MD 11/22/18 2342

## 2018-11-21 NOTE — Telephone Encounter (Signed)
Patient called and says she is on Bactrim for a UTI and was given Zofran for nausea. She says she took them both together yesterday evening and again this morning, both times she vomited. She says she doesn't know which one is causing her to vomit, but one is not agreeing with her. I advised I will send this over to Dr. Alain Marion and someone will call back with his recommendation, patient verbalized understanding.  Reason for Disposition . Caller has NON-URGENT medication question about med that PCP prescribed and triager unable to answer question  Answer Assessment - Initial Assessment Questions 1. SYMPTOMS: "Do you have any symptoms?"     Vomiting 2. SEVERITY: If symptoms are present, ask "Are they mild, moderate or severe?"     Mild, but it happened both times after taking the medication  Protocols used: MEDICATION QUESTION CALL-A-AH

## 2018-11-22 ENCOUNTER — Observation Stay (HOSPITAL_BASED_OUTPATIENT_CLINIC_OR_DEPARTMENT_OTHER): Payer: 59

## 2018-11-22 ENCOUNTER — Observation Stay (HOSPITAL_COMMUNITY): Payer: 59

## 2018-11-22 DIAGNOSIS — I10 Essential (primary) hypertension: Secondary | ICD-10-CM

## 2018-11-22 DIAGNOSIS — R112 Nausea with vomiting, unspecified: Secondary | ICD-10-CM | POA: Diagnosis not present

## 2018-11-22 LAB — URINE CULTURE: Culture: NO GROWTH

## 2018-11-22 LAB — ECHOCARDIOGRAM LIMITED
Height: 64 in
Weight: 4409.2 oz

## 2018-11-22 LAB — COMPREHENSIVE METABOLIC PANEL
ALT: 18 U/L (ref 0–44)
AST: 19 U/L (ref 15–41)
Albumin: 3.4 g/dL — ABNORMAL LOW (ref 3.5–5.0)
Alkaline Phosphatase: 63 U/L (ref 38–126)
Anion gap: 14 (ref 5–15)
BUN: 11 mg/dL (ref 6–20)
CO2: 24 mmol/L (ref 22–32)
Calcium: 8.5 mg/dL — ABNORMAL LOW (ref 8.9–10.3)
Chloride: 100 mmol/L (ref 98–111)
Creatinine, Ser: 0.99 mg/dL (ref 0.44–1.00)
GFR calc Af Amer: >60 mL/min (ref >60)
GFR calc non Af Amer: >60 mL/min (ref >60)
Glucose, Bld: 110 mg/dL — ABNORMAL HIGH (ref 70–99)
Potassium: 3.2 mmol/L — ABNORMAL LOW (ref 3.5–5.1)
Sodium: 138 mmol/L (ref 135–145)
Total Bilirubin: 0.7 mg/dL (ref 0.3–1.2)
Total Protein: 7.5 g/dL (ref 6.5–8.1)

## 2018-11-22 LAB — CBC
HCT: 29.8 % — ABNORMAL LOW (ref 36.0–46.0)
Hemoglobin: 9.5 g/dL — ABNORMAL LOW (ref 12.0–15.0)
MCH: 25.6 pg — ABNORMAL LOW (ref 26.0–34.0)
MCHC: 31.9 g/dL (ref 30.0–36.0)
MCV: 80.3 fL (ref 80.0–100.0)
Platelets: 415 10*3/uL — ABNORMAL HIGH (ref 150–400)
RBC: 3.71 MIL/uL — ABNORMAL LOW (ref 3.87–5.11)
RDW: 17.3 % — ABNORMAL HIGH (ref 11.5–15.5)
WBC: 15.2 10*3/uL — ABNORMAL HIGH (ref 4.0–10.5)
nRBC: 0 % (ref 0.0–0.2)

## 2018-11-22 LAB — LIPID PANEL
Cholesterol: 205 mg/dL — ABNORMAL HIGH (ref 0–200)
HDL: 55 mg/dL (ref >40)
LDL Cholesterol: 136 mg/dL — ABNORMAL HIGH (ref 0–99)
Total CHOL/HDL Ratio: 3.7 RATIO
Triglycerides: 68 mg/dL (ref <150)
VLDL: 14 mg/dL (ref 0–40)

## 2018-11-22 LAB — CREATININE, SERUM
Creatinine, Ser: 1.08 mg/dL — ABNORMAL HIGH (ref 0.44–1.00)
GFR calc Af Amer: >60 mL/min (ref >60)
GFR calc non Af Amer: 59 mL/min — ABNORMAL LOW (ref >60)

## 2018-11-22 LAB — TROPONIN I
Troponin I: 0.05 ng/mL (ref <0.03)
Troponin I: 0.05 ng/mL (ref <0.03)
Troponin I: 0.05 ng/mL (ref <0.03)

## 2018-11-22 LAB — CK TOTAL AND CKMB (NOT AT ARMC)
CK, MB: 1.9 ng/mL (ref 0.5–5.0)
Relative Index: 0.8 (ref 0.0–2.5)
Total CK: 240 U/L — ABNORMAL HIGH (ref 38–234)

## 2018-11-22 MED ORDER — HYDRALAZINE HCL 20 MG/ML IJ SOLN: 10.0000 mg | Freq: Four times a day (QID) | INTRAMUSCULAR | Status: DC

## 2018-11-22 MED ORDER — ASPIRIN EC 325 MG PO TBEC: 325.0000 mg | DELAYED_RELEASE_TABLET | Freq: Every day | ORAL | Status: DC

## 2018-11-22 MED ORDER — ONDANSETRON HCL 4 MG PO TABS: 4.0000 mg | ORAL_TABLET | Freq: Three times a day (TID) | ORAL | 0 refills | Status: DC | PRN

## 2018-11-22 MED ORDER — CEPHALEXIN 500 MG PO CAPS: 500.0000 mg | ORAL_CAPSULE | Freq: Two times a day (BID) | ORAL | 0 refills | Status: AC

## 2018-11-22 MED ORDER — PANTOPRAZOLE SODIUM 40 MG PO TBEC: 40.0000 mg | DELAYED_RELEASE_TABLET | Freq: Every day | ORAL | 0 refills | Status: DC

## 2018-11-22 MED ORDER — HYDRALAZINE HCL 50 MG PO TABS
100.0000 mg | ORAL_TABLET | Freq: Three times a day (TID) | ORAL | Status: DC
  Administered 2018-11-22 (×2): 100 mg via ORAL
  Filled 2018-11-22 (×2): qty 2

## 2018-11-22 MED ORDER — ADULT MULTIVITAMIN W/MINERALS CH
1.0000 | ORAL_TABLET | Freq: Every day | ORAL | Status: DC
  Administered 2018-11-22: 1 via ORAL
  Filled 2018-11-22: qty 1

## 2018-11-22 MED ORDER — PANTOPRAZOLE SODIUM 40 MG PO TBEC
40.0000 mg | DELAYED_RELEASE_TABLET | Freq: Two times a day (BID) | ORAL | Status: DC
  Administered 2018-11-22 (×2): 40 mg via ORAL
  Filled 2018-11-22 (×2): qty 1

## 2018-11-22 MED ORDER — LABETALOL HCL 5 MG/ML IV SOLN: 10.0000 mg | Freq: Four times a day (QID) | INTRAVENOUS | Status: DC

## 2018-11-22 MED ORDER — LABETALOL HCL 300 MG PO TABS
600.0000 mg | ORAL_TABLET | Freq: Two times a day (BID) | ORAL | Status: DC
  Administered 2018-11-22: 600 mg via ORAL
  Filled 2018-11-22: qty 2

## 2018-11-22 MED ORDER — ATORVASTATIN CALCIUM 80 MG PO TABS: 80.0000 mg | ORAL_TABLET | Freq: Every day | ORAL | Status: DC

## 2018-11-22 MED ORDER — POLYETHYLENE GLYCOL 3350 17 G PO PACK
17.0000 g | PACK | Freq: Two times a day (BID) | ORAL | Status: DC
  Administered 2018-11-22: 17 g via ORAL
  Filled 2018-11-22: qty 1

## 2018-11-22 MED FILL — CEPHALEXIN 500 MG CAPSULE: 500 | 3 days supply | Qty: 6 | Fill #0

## 2018-11-22 MED FILL — ONDANSETRON HCL 4 MG TABLET: 4 | 7 days supply | Qty: 20 | Fill #0

## 2018-11-22 MED FILL — PANTOPRAZOLE SOD DR 40 MG T: 40 | 14 days supply | Qty: 14 | Fill #0

## 2018-11-22 NOTE — Progress Notes (Signed)
Initial Nutrition Assessment  RD working remotely.  DOCUMENTATION CODES:   Morbid obesity  INTERVENTION:   - Encourage good PO intake of nutrient dense foods  - Add MVI with minerals  NUTRITION DIAGNOSIS:   Inadequate oral intake related to acute illness, vomiting, nausea as evidenced by estimated needs.  GOAL:   Patient will meet greater than or equal to 90% of their needs  MONITOR:   PO intake, Diet advancement, Weight trends, Labs, I & O's  REASON FOR ASSESSMENT:   Malnutrition Screening Tool    ASSESSMENT:   53 yo female, admitted with nausea and vomiting x 3 days. PMH significant for anemia, h/o breast cancer, HTN.  RD unable to reach patient by phone. Per chart review, weight is stable and pt is eating well. Will order MVI and continue to monitor adequacy of intake.   Labs: potassium 3.2 (L), glucose 110 mg/dL Meds: Lipitor, Protonix EC, Miralax, Senokot-S  NUTRITION - FOCUSED PHYSICAL EXAM: Deferred - RD working remotely  Diet Order:  Noted shellfish allergy; reaction to tomatoes and mushrooms Diet Order            DIET SOFT Room service appropriate? Yes; Fluid consistency: Thin  Diet effective now            PO Intake 100% x 1 meal recorded today  EDUCATION NEEDS:  No education needs have been identified at this time  Skin:  Skin Assessment: Reviewed RN Assessment  Last BM:  PTA  Height:  Ht Readings from Last 1 Encounters:  11/21/18 5\' 4"  (1.626 m)    Weight:  Wt Readings:  11/21/18 125 kg  11/20/18 125.2 kg  09/26/17 124.3 kg  08/27/17 121.6 kg  08/14/17 124.6 kg  Wt stable  Ideal Body Weight:  54.5 kg  BMI:  Body mass index is 47.3 kg/m. obese class 3  Estimated Nutritional Needs:   Kcal:  1375-1650 (25-30 kcal/kg IBW)  Protein:  66-83 gm (1.2-1.5 g/kg IBW)  Fluid:  >/= 1.5 L daily or per MD  Althea Grimmer, MS, RDN, LDN Pager: [phone] Available Mondays and Fridays, 9am-2pm

## 2018-11-22 NOTE — Care Management Obs Status (Signed)
[place] NOTIFICATION   Patient Details  Name: Jasmine Stafford MRN: [id number] Date of Birth: [birth year]   Medicare Observation Status Notification Given:  Yes    Bartholomew Crews, RN 11/22/2018, 2:44 PM

## 2018-11-22 NOTE — TOC Initial Note (Signed)
Transition of Care [facility]) - Initial/Assessment Note    Patient Details  Name: Jasmine Stafford MRN: [id number] Date of Birth: 24-Mar-1966  [facility] CM/SW Contact:    Bartholomew Crews, RN Phone Number: [phone] 11/22/2018, 2:50 PM  Clinical Narrative:                 Spoke with patient at bedside. Home with spouse who also has health issues. Drove self to hospital. Plans to drive self home, but can call someone if needed. No HH/DME needs. No transition of care needs identified at this time.   Expected Discharge Plan: Home/Self Care Barriers to Discharge: Continued Medical Work up   Patient Goals and CMS Choice     Choice offered to / list presented to : NA  Expected Discharge Plan and Services Expected Discharge Plan: Home/Self Care In-house Referral: NA Discharge Planning Services: CM Consult Post Acute Care Choice: NA Living arrangements for the past 2 months: Single Family Home                 DME Arranged: N/A DME Agency: NA       HH Arranged: NA HH Agency: NA        Prior Living Arrangements/Services Living arrangements for the past 2 months: Single Family Home Lives with:: Self, Spouse Patient language and need for interpreter reviewed:: Yes Do you feel safe going back to the place where you live?: Yes            Criminal Activity/Legal Involvement Pertinent to Current Situation/Hospitalization: No - Comment as needed  Activities of Daily Living Home Assistive Devices/Equipment: None ADL Screening (condition at time of admission) Patient's cognitive ability adequate to safely complete daily activities?: Yes Is the patient deaf or have difficulty hearing?: No Does the patient have difficulty seeing, even when wearing glasses/contacts?: No Does the patient have difficulty concentrating, remembering, or making decisions?: No Patient able to express need for assistance with ADLs?: Yes Does the patient have difficulty dressing or bathing?:  No Independently performs ADLs?: Yes (appropriate for developmental age) Does the patient have difficulty walking or climbing stairs?: No Weakness of Legs: None Weakness of Arms/Hands: None  Permission Sought/Granted                  Emotional Assessment Appearance:: Appears older than stated age Attitude/Demeanor/Rapport: Engaged Affect (typically observed): Accepting Orientation: : Oriented to Self, Oriented to  Time, Oriented to Place, Oriented to Situation Alcohol / Substance Use: Not Applicable Psych Involvement: No (comment)  Admission diagnosis:  Nausea & vomiting [R11.2] Patient Active Problem List   Diagnosis Date Noted  . Nausea & vomiting 11/21/2018  . Iron deficiency anemia 11/20/2018  . Dysuria 11/20/2018  . Hypertensive urgency 08/13/2017  . CAP (community acquired pneumonia) 08/13/2017  . Intractable cyclical vomiting with nausea   . Cancer of upper-outer quadrant of female breast ([place]) 08/15/2012  . Acute kidney injury ([place]) 04/01/2012  . Hypokalemia 04/01/2012  . Dehydration 04/01/2012  . Gastroenteritis 04/01/2012  . Obesity (BMI 35.0-39.9 without comorbidity) 04/01/2012  . Constipation 04/01/2012  . Microcytic anemia 04/01/2012  . Seromucinous otitis media 10/21/2010  . Hypertrophy of tonsil 10/21/2010  . BACK PAIN, THORACIC REGION 06/29/2010  . Essential hypertension 05/21/2010  . BACK PAIN 05/21/2010   PCP:  Cassandria Anger, MD Pharmacy:   [id number] - [place], [place] [address] Phone: [phone] Fax: [phone]  [facility]  [place], [place] [address] Phone: [phone] Fax: [phone]     Social Determinants of Health ([place]) Interventions    Readmission Risk Interventions No flowsheet data found.

## 2018-11-22 NOTE — Progress Notes (Signed)
Jasmine Stafford to be discharged home per MD order. Discussed prescriptions and follow up appointments with the patient. Prescriptions given to patient; medication list explained in detail. Patient verbalized understanding.  Skin clean, dry and intact without evidence of skin break down, no evidence of skin tears noted. IV catheter discontinued intact. Site without signs and symptoms of complications. Dressing and pressure applied. Pt denies pain at the site currently. No complaints noted.  Patient free of lines, drains, and wounds.   An After Visit Summary (AVS) was printed and given to the patient. Patient escorted via wheelchair, and discharged home via private auto.  Baldo Ash, RN

## 2018-11-22 NOTE — Telephone Encounter (Signed)
Pt is currently at [facility] about these symptoms

## 2018-11-22 NOTE — Progress Notes (Signed)
  Echocardiogram 2D Echocardiogram has been performed.  Jasmine Stafford 11/22/2018, 11:37 AM

## 2018-11-22 NOTE — Progress Notes (Signed)
Pharmacy medications have been taken home with the patient.   Farley Ly RN

## 2018-11-22 NOTE — Progress Notes (Addendum)
XCOVER Troponin mildly elevated at 0.05, no chest pain  A/P Elevated troponin Check cpk, mb Check trop I q6h x3 Check cardiac echo Start aspirin 325mg  po qday Start Lipitor 80mg  po qhs Cont labetalol Check Lipid Please consider cardiology consultation in AM

## 2018-11-26 ENCOUNTER — Ambulatory Visit (INDEPENDENT_AMBULATORY_CARE_PROVIDER_SITE_OTHER): Payer: 59 | Admitting: Internal Medicine

## 2018-11-26 ENCOUNTER — Encounter: Payer: Self-pay | Admitting: Internal Medicine

## 2018-11-26 DIAGNOSIS — D509 Iron deficiency anemia, unspecified: Secondary | ICD-10-CM

## 2018-11-26 DIAGNOSIS — Z1211 Encounter for screening for malignant neoplasm of colon: Secondary | ICD-10-CM

## 2018-11-26 DIAGNOSIS — N390 Urinary tract infection, site not specified: Secondary | ICD-10-CM | POA: Insufficient documentation

## 2018-11-26 DIAGNOSIS — I10 Essential (primary) hypertension: Secondary | ICD-10-CM

## 2018-11-26 DIAGNOSIS — E860 Dehydration: Secondary | ICD-10-CM

## 2018-11-26 DIAGNOSIS — E876 Hypokalemia: Secondary | ICD-10-CM

## 2018-11-26 DIAGNOSIS — N3001 Acute cystitis with hematuria: Secondary | ICD-10-CM

## 2018-11-26 NOTE — Progress Notes (Signed)
Virtual Visit via Video Note  I connected with Jasmine Stafford on 11/26/18 at  9:30 AM EDT by a video enabled telemedicine application and verified that I am speaking with the correct person using two identifiers.   I discussed the limitations of evaluation and management by telemedicine and the availability of in person appointments. The patient expressed understanding and agreed to proceed.  History of Present Illness: Patient is following up on her recent hospital stay for a urinary tract infection complicated by severe hypertension, dehydration, hypokalemia.  We need to follow-up on her anemia that is likely related to her chronic vaginal bleeding.  She denies chest pain or shortness of breath.  Her fatigue is better.  She has been able to tolerate food and drinks.  She is ready to go back to work tomorrow.  Her blood pressure was 168/87 last time she checked.  She needs a new blood pressure cuff  Further history:   "Nausea and vomitting Check CXR Check Trop zofran 4mg  iv q6h prn    Dysuria Rocephin 1gm iv qday (first dose was given in ED without issues)  Microscopic hematuria Check CT scan abd/ pelvis  Hypokalemia likely due to n/v, ddx hydrochlorothiazide Replete Check cmp in am  Hypertension uncontrolled Labetalol 10mg  iv q6h Hydralazine 10mg  iv q6h  Hydralazine 10mg  iv q6h prn sbp >160  Iron deficiency anemia Cont ferrous sulfate Please make sure follows up with pcp for further w/up, ie colonoscopy  H/o Breast cancer Cont Arimidex"  Observations/Objective: She is in no acute distress.  Assessment and Plan:  See plan Follow Up Instructions:    I discussed the assessment and treatment plan with the patient. The patient was provided an opportunity to ask questions and all were answered. The patient agreed with the plan and demonstrated an understanding of the instructions.   The patient was advised to call back or seek an in-person evaluation if the  symptoms worsen or if the condition fails to improve as anticipated.  I provided 25 minutes of non-face-to-face time during this encounter.   Walker Kehr, MD

## 2018-11-26 NOTE — Assessment & Plan Note (Signed)
On PO KCl Labs

## 2018-11-26 NOTE — Assessment & Plan Note (Signed)
S/p hosp stay - IVF

## 2018-11-26 NOTE — Assessment & Plan Note (Signed)
HCTZ, Hydralazine, Labetalol Suboptimal control.  Follow-up with me in 1 month

## 2018-11-26 NOTE — Assessment & Plan Note (Signed)
Finish the antibiotic

## 2018-11-26 NOTE — Assessment & Plan Note (Signed)
Needs colonoscopy Iron po

## 2018-11-26 NOTE — Discharge Summary (Signed)
[facility] Discharge Summary   Patient: Jasmine Stafford QMV:[id number]   PCP: Cassandria Anger, MD DOB: [birth year]   Date of admission: 11/21/2018   Date of discharge: 11/22/2018    Discharge Diagnoses:  Principal Problem:   Nausea & vomiting Active Problems:   Essential hypertension   Hypokalemia   Dysuria   Admitted From: Home Disposition: Home  Recommendations for Outpatient Follow-up:  1. Please follow-up with PCP in 1 week  Follow-up Information    Plotnikov, Evie Lacks, MD. Schedule an appointment as soon as possible for a visit in 1 week(s).   Specialty:  Internal Medicine Contact information: [address] [place] [address] [phone]          Diet recommendation: Cardiac diet  Activity: The patient is advised to gradually reintroduce usual activities.  Discharge Condition: good  Code Status: Full code  History of present illness: As per the H and P dictated on admission, "Jasmine Stafford  is a 53 y.o. female,w hypertension, iron deficiency anemia, apparently presents with c/o n/v x 3 days, no bloody emesis.  Pt denies fever, chills, cough, cp, palp, sob, abd pain, diarrhea, brbpr, black stool.  Pt states has had slight dysuria, but no cva tenderness.  Pt seen yesterday by Dr. Jenny Reichmann and started on ? Bactrim, w/o relief. Pt has urinalysis yesterday w 3-6 wbc.    In ED,  T 99 P 81  Bp 166/98  Pox 97% on RA Wt 125 kg  Wbc 18.1, hgb 10.1, Plt 415 Na 136, K 3.3,  Bun 10, Creatinine 1.05 Ast 20, Alt 19, alk phos 67, T. Bili 1.0 Lipase 23  Urinalysis N +, LE negative RBC >50, WBC 0-5  Pt will be admitted for n/v, dysuria, hypokalemia, and iron deficiency anemia. "  Hospital Course:  Summary of her active problems in the hospital is as following. Nausea and vomitting Suspect gastritis. Patient was initially kept n.p.o. CT abdomen pelvis performed which was negative for any acute abnormality. X-ray chest was also negative. Lipase were  negative. Liver functions were stable as well. No abdominal pain or abdominal abnormality at the time of my evaluation. Patient was able to tolerate oral soft diet. Unsure whether being on Bactrim added to  UTI Rocephin 1gm iv qday (first dose was given in ED without issues) We will transition to oral Keflex on discharge Urine culture negative  Microscopic hematuria No significant abnormality on the CT scan.  Likely associated with her UTI  Hypokalemia likely due to n/v, ddx hydrochlorothiazide Replete Replace stable  Hypertension uncontrolled Continue home medication now that the patient is able to take oral medications  Iron deficiency anemia Cont ferrous sulfate Please make sure follows up with pcp for further w/up, ie colonoscopy  H/o Breast cancer Cont Arimidex  Patient was ambulatory without any assistance. On the day of the discharge the patient's vitals were stable , and no other acute medical condition were reported by patient. the patient was felt safe to be discharge at home with family.  Consultants: none Procedures: none  DISCHARGE MEDICATION: Allergies as of 11/22/2018      Reactions   Other Hives   Reaction to Tomatoes & Mushrooms   Shellfish Allergy Hives   Erythromycin Hives   Moxifloxacin Hives   Penicillins Hives   Has patient had a PCN reaction causing immediate rash, facial/tongue/throat swelling, SOB or lightheadedness with hypotension: Yes Has patient had a PCN reaction causing severe rash involving mucus membranes or skin necrosis: No  Has patient had a PCN reaction that required hospitalization: No Has patient had a PCN reaction occurring within the last 10 years: No If all of the above answers are "NO", then may proceed with Cephalosporin use.   Tequin [gatifloxacin] Hives      Medication List    STOP taking these medications   guaiFENesin 100 MG/5ML Soln Commonly known as:  ROBITUSSIN   meloxicam 7.5 MG tablet Commonly known as:   Mobic   sulfamethoxazole-trimethoprim 800-160 MG tablet Commonly known as:  BACTRIM DS     TAKE these medications   anastrozole 1 MG tablet Commonly known as:  ARIMIDEX TAKE 1 TABLET BY MOUTH EVERY DAY   Calcium 600 + D 600-200 MG-UNIT Tabs Generic drug:  Calcium Carb-Cholecalciferol Take 1 tablet by mouth daily.   CHLORELLA PO Take 4 g by mouth daily.   CoQ10 400 MG Caps Take 400 mg by mouth daily.   DANDELION PO Take 2 capsules by mouth daily.   Digestive Enzymes Caps Take 2 capsules by mouth 3 (three) times daily with meals.   ferrous gluconate 325 MG tablet Commonly known as:  FERGON Take 1 tablet (325 mg total) by mouth 3 (three) times daily with meals.   GARLIC PO Take 3 capsules by mouth daily.   hydrALAZINE 100 MG tablet Commonly known as:  APRESOLINE Take 100 mg by mouth 3 (three) times daily.   hydrochlorothiazide 25 MG tablet Commonly known as:  HYDRODIURIL Take 25 mg by mouth daily with lunch.   ipratropium-albuterol 0.5-2.5 (3) MG/3ML Soln Commonly known as:  DUONEB Take 3 mLs by nebulization 2 (two) times daily.   labetalol 300 MG tablet Commonly known as:  NORMODYNE Take 600 mg by mouth 2 (two) times daily.   Melatonin 10 MG Tabs Take 20 mg by mouth at bedtime as needed (sleep).   multivitamin with minerals Tabs tablet Take 1 tablet by mouth at bedtime.   ondansetron 4 MG tablet Commonly known as:  Zofran Take 1 tablet (4 mg total) by mouth every 8 (eight) hours as needed for nausea or vomiting.   OVER THE COUNTER MEDICATION Take 2 capsules by mouth daily. Dim Supreme vegetarian capsules (from cruciferous vegetables)   pantoprazole 40 MG tablet Commonly known as:  PROTONIX Take 1 tablet (40 mg total) by mouth daily for 14 days.   polyethylene glycol powder 17 GM/SCOOP powder Commonly known as:  GLYCOLAX/MIRALAX Take 17 g by mouth 2 (two) times daily as needed. What changed:  reasons to take this   PROBIOTIC ACIDOPHILUS PO Take 2  tablets by mouth daily.   senna-docusate 8.6-50 MG tablet Commonly known as:  Senokot-S Take 1 tablet by mouth 2 (two) times daily.   traMADol 50 MG tablet Commonly known as:  ULTRAM Take 1 tablet (50 mg total) by mouth every 6 (six) hours as needed.   Turmeric Powd Take 7.5 mLs by mouth at bedtime.   vitamin C 1000 MG tablet Take 3,000 mg by mouth daily.   Vitamin D3 250 MCG (10000 UT) capsule Take 10,000 Units by mouth daily.     ASK your doctor about these medications   cephALEXin 500 MG capsule Commonly known as:  KEFLEX Take 1 capsule (500 mg total) by mouth 2 (two) times daily for 3 days. Ask about: Should I take this medication?      Allergies  Allergen Reactions  . Other Hives    Reaction to Tomatoes & Mushrooms  . Shellfish Allergy Hives  . Erythromycin  Hives  . Moxifloxacin Hives  . Penicillins Hives    Has patient had a PCN reaction causing immediate rash, facial/tongue/throat swelling, SOB or lightheadedness with hypotension: Yes Has patient had a PCN reaction causing severe rash involving mucus membranes or skin necrosis: No Has patient had a PCN reaction that required hospitalization: No Has patient had a PCN reaction occurring within the last 10 years: No If all of the above answers are "NO", then may proceed with Cephalosporin use.  Flo Shanks [Gatifloxacin] Hives   Discharge Instructions    Diet - low sodium heart healthy   Complete by:  As directed    Discharge instructions   Complete by:  As directed    It is important that you read the given instructions as well as go over your medication list with RN to help you understand your care after this hospitalization.  Discharge Instructions: Please follow-up with PCP in 1-2 weeks  Please request your primary care physician to go over all Hospital Tests and Procedure/Radiological results at the follow up. Please get all Hospital records sent to your PCP by signing hospital release before you go home.    Do not take more than prescribed Pain, Sleep and Anxiety Medications. You were cared for by a hospitalist during your hospital stay. If you have any questions about your discharge medications or the care you received while you were in the hospital after you are discharged, you can call the unit '@UNIT' @ you were admitted to and ask to speak with the hospitalist on call if the hospitalist that took care of you is not available.  Once you are discharged, your primary care physician will handle any further medical issues. Please note that NO REFILLS for any discharge medications will be authorized once you are discharged, as it is imperative that you return to your primary care physician (or establish a relationship with a primary care physician if you do not have one) for your aftercare needs so that they can reassess your need for medications and monitor your lab values. You Must read complete instructions/literature along with all the possible adverse reactions/side effects for all the Medicines you take and that have been prescribed to you. Take any new Medicines after you have completely understood and accept all the possible adverse reactions/side effects. Wear Seat belts while driving. If you have smoked or chewed Tobacco in the last 2 yrs please stop smoking and/or stop any Recreational drug use.  If you drink alcohol, please moderate the use and do not drive, operating heavy machinery, perform activities at heights, swimming or participation in water activities or provide baby sitting services under influence.   Increase activity slowly   Complete by:  As directed      Discharge Exam: Filed Weights   11/21/18 1638  Weight: 125 kg   Vitals:   11/22/18 0555 11/22/18 1014  BP: (!) 194/73 (!) 187/78  Pulse: 87 74  Resp: 18 18  Temp: 99.1 F (37.3 C) 99.7 F (37.6 C)  SpO2: 92% 97%   General: Appear in no distress, no Rash; Oral Mucosa moist. Cardiovascular: S1 and S2 Present, no  Murmur, no JVD Respiratory: Bilateral Air entry present and Clear to Auscultation, no Crackles, no wheezes Abdomen: Bowel Sound present, Soft and no tenderness Extremities: no Pedal edema, no calf tenderness Neurology: Grossly no focal neuro deficit.  The results of significant diagnostics from this hospitalization (including imaging, microbiology, ancillary and laboratory) are listed below for reference.    Significant  Diagnostic Studies: Ct Abdomen Pelvis Wo Contrast  Result Date: 11/21/2018 CLINICAL DATA:  Hematuria EXAM: CT ABDOMEN AND PELVIS WITHOUT CONTRAST TECHNIQUE: Multidetector CT imaging of the abdomen and pelvis was performed following the standard protocol without IV contrast. COMPARISON:  07/28/2014 FINDINGS: Lower chest: no acute abnormality Hepatobiliary: No focal hepatic abnormality. Gallbladder unremarkable. Pancreas: No focal abnormality or ductal dilatation. Spleen: No focal abnormality.  Normal size. Adrenals/Urinary Tract: No adrenal abnormality. No focal renal abnormality. No stones or hydronephrosis. Urinary bladder is unremarkable. Stomach/Bowel: Normal appendix. Stomach, large and small bowel grossly unremarkable. Vascular/Lymphatic: No evidence of aneurysm or adenopathy. Reproductive: Calcified fibroid centrally in the uterus. No adnexal mass. Other: No free fluid or free air. Musculoskeletal: No acute bony abnormality. IMPRESSION: No renal or ureteral stones.  No hydronephrosis. No acute findings in the abdomen or pelvis. Calcified uterine fibroid. Electronically Signed   By: Rolm Baptise M.D.   On: 11/21/2018 21:23   Dg Chest 2 View  Result Date: 11/22/2018 CLINICAL DATA:  Nausea and vomiting.  History of breast carcinoma EXAM: CHEST - 2 VIEW COMPARISON:  August 17, 2017. FINDINGS: There is no edema or consolidation. Heart is borderline enlarged with pulmonary vascularity normal. No adenopathy. No bone lesions. IMPRESSION: Borderline cardiac prominence. No edema or  consolidation. No evident adenopathy. Electronically Signed   By: Lowella Grip III M.D.   On: 11/22/2018 08:07    Microbiology: Recent Results (from the past 240 hour(s))  Urine Culture     Status: None   Collection Time: 11/20/18 11:15 AM  Result Value Ref Range Status   MICRO NUMBER: [id number]  Final   SPECIMEN QUALITY: Adequate  Final   Sample Source URINE  Final   STATUS: FINAL  Final   Result: No Growth  Final  Urine culture     Status: None   Collection Time: 11/21/18  5:15 PM  Result Value Ref Range Status   Specimen Description URINE, RANDOM  Final   Special Requests NONE  Final   Culture   Final    NO GROWTH Performed at [facility], [address], [place], [place] [address]    Report Status 11/22/2018 FINAL  Final     Labs: CBC: Recent Labs  Lab 11/20/18 1115 11/21/18 1704 11/22/18 0549  WBC 16.1* 18.1* 15.2*  NEUTROABS 14.0* 15.4*  --   HGB 10.2* 10.1* 9.5*  HCT 31.6* 32.8* 29.8*  MCV 79.2 81.4 80.3  PLT 368.0 415* 415*   Basic Metabolic Panel: Recent Labs  Lab 11/20/18 1115 11/21/18 1704 11/21/18 2258 11/22/18 0549  NA 137 136  --  138  K 3.4* 3.3*  --  3.2*  CL 100 99  --  100  CO2 28 23  --  24  GLUCOSE 104* 112*  --  110*  BUN 14 10  --  11  CREATININE 1.04 1.05* 1.08* 0.99  CALCIUM 8.6 8.9  --  8.5*   Liver Function Tests: Recent Labs  Lab 11/20/18 1115 11/21/18 1704 11/22/18 0549  AST '14 20 19  ' ALT '17 19 18  ' ALKPHOS 62 67 63  BILITOT 0.8 1.0 0.7  PROT 7.7 8.2* 7.5  ALBUMIN 3.9 3.6 3.4*   Recent Labs  Lab 11/20/18 1115 11/21/18 1704  LIPASE 6.0* 23   No results for input(s): AMMONIA in the last 168 hours. Cardiac Enzymes: Recent Labs  Lab 11/21/18 2258 11/22/18 0549 11/22/18 1346  CKTOTAL  --  240*  --   CKMB  --  1.9  --   TROPONINI 0.05* 0.05* 0.05*   BNP (last 3 results) No results for input(s): BNP in the last 8760 hours. CBG: No results for input(s): GLUCAP in the last 168 hours. Time spent: 35  minutes  Signed:  Berle Mull  [facility] 11/22/2018

## 2018-11-28 ENCOUNTER — Telehealth: Payer: Self-pay | Admitting: Internal Medicine

## 2018-11-28 NOTE — Telephone Encounter (Signed)
I received FMLA forms to be completed for patient. Time out was 11/19/18 to 11/26/18 Return to work on 11/27/18.  Forms have been completed & placed in providers box to review and sign.

## 2018-12-05 NOTE — Telephone Encounter (Signed)
Forms signed, Copy sent to [employer] for.   Patient informed, will pick up original and drop them off at employer.

## 2018-12-19 NOTE — Assessment & Plan Note (Signed)
Right breast invasive lobular cancer grade 1-2, ER 99%, PR 99%, HER-2 negative Ratio 1.3, Ki-67 13%, initial MRI revealed 7.1 cm area of mass or non-mass enhancement. Current treatment: Zoladex (noncompliant, last given March 2019 but had not received many doses), anastrozole 1 mg daily Patient never had surgery because she does not wish to undergo mastectomy.  Recommendation: 1.  continue with Zoladex and anastrozole.  I recommended the monthly Zoladex.  If she does not follow our instructions then I do not see any purpose in the seeing the patient. 3.  We will need to obtain a mammogram and a breast MRI. (Have not been performed) 4.  Return to clinic after these imaging studies to discuss the treatment plan.

## 2018-12-19 NOTE — Assessment & Plan Note (Signed)
Chronic microcytic iron deficiency anemia: Possibly related to heavy menstrual bleeding versus malabsorption.  Patient has been on oral iron therapy for the last 20 years  IV iron treatment: Given March 2019  Lab review:

## 2018-12-24 NOTE — Progress Notes (Signed)
Patient Care Team: Plotnikov, Evie Lacks, MD as PCP - General  DIAGNOSIS:    ICD-10-CM   1. Malignant neoplasm of upper-outer quadrant of right female breast, unspecified estrogen receptor status ([place]) C50.411 MM DIAG BREAST TOMO BILATERAL  2. Microcytic anemia D50.9 goserelin (ZOLADEX) injection 3.6 mg  3. Encounter for screening mammogram for malignant neoplasm of breast Z12.31 MR BREAST BILATERAL W WO CONTRAST INC CAD    SUMMARY OF ONCOLOGIC HISTORY:   Cancer of upper-outer quadrant of female breast ([place])   08/09/2012 Initial Diagnosis    Right breast biopsy 10 o'clock position: Invasive lobular cancer, ALH, grade 1-2, ER 99%, PR 99%, Ki-67 13%, HER-2 negative ratio 1.3    09/26/2012 -  Anti-estrogen oral therapy    Zoladex with anastrozole prescribed at cancer treatment centers of [place] along with lots of herbs.  Annual mammograms and MRIs showed stable findings.  Patient never had surgery     CHIEF COMPLIANT: History of breast cancer and chronic iron deficiency anemia  INTERVAL HISTORY: Jasmine Stafford is a 53 y.o. with above-mentioned history of invasive lobular carcinoma of the right breast who is currently on treatment with Zoladex and anastrozole. She also has a history of chronic iron deficiency anemia for which she has received IV iron infusions. I last saw her over a year ago. She was admitted to the hospital for nausea and vomiting and was seen to have a UTI, microscopic hematuria, and hypokalemia. She presents to the clinic today for follow-up.   For the breast cancer she did not undergo surgery and was on anastrozole with Zoladex.  However she has not received any Zoladex injection since a year ago.  She tells me that she has however continued to take anastrozole.  I am not sure why she did not call us to schedule these appointments.  Since she continues to have menstrual cycles, I do not believe that the anastrozole has been very effective in the last year.  Also she has  not had any mammograms or breast MRIs.  REVIEW OF SYSTEMS:   Constitutional: Denies fevers, chills or abnormal weight loss Eyes: Denies blurriness of vision Ears, nose, mouth, throat, and face: Denies mucositis or sore throat Respiratory: Denies cough, dyspnea or wheezes Cardiovascular: Denies palpitation, chest discomfort Gastrointestinal: Denies nausea, heartburn or change in bowel habits Skin: Denies abnormal skin rashes Lymphatics: Denies new lymphadenopathy or easy bruising Neurological: Denies numbness, tingling or new weaknesses Behavioral/Psych: Mood is stable, no new changes  Extremities: No lower extremity edema Breast: Right breast nodularity is unchanged according to the patient All other systems were reviewed with the patient and are negative.  I have reviewed the past medical history, past surgical history, social history and family history with the patient and they are unchanged from previous note.  ALLERGIES:  is allergic to other; shellfish allergy; erythromycin; moxifloxacin; penicillins; and tequin [gatifloxacin].  MEDICATIONS:  Current Outpatient Medications  Medication Sig Dispense Refill   anastrozole (ARIMIDEX) 1 MG tablet TAKE 1 TABLET BY MOUTH EVERY DAY (Patient taking differently: Take 1 mg by mouth daily. ) 30 tablet 3   Ascorbic Acid (VITAMIN C) 1000 MG tablet Take 3,000 mg by mouth daily.      Calcium Carb-Cholecalciferol (CALCIUM 600 + D) 600-200 MG-UNIT TABS Take 1 tablet by mouth daily.     Cholecalciferol (VITAMIN D3) 10000 UNITS capsule Take 10,000 Units by mouth daily.     Coenzyme Q10 (COQ10) 400 MG CAPS Take 400 mg by mouth daily.  DANDELION PO Take 2 capsules by mouth daily.      Digestive Enzymes CAPS Take 2 capsules by mouth 3 (three) times daily with meals.     ferrous gluconate (FERGON) 325 MG tablet Take 1 tablet (325 mg total) by mouth 3 (three) times daily with meals. 30 tablet 0   GARLIC PO Take 3 capsules by mouth daily.      hydrALAZINE (APRESOLINE) 100 MG tablet Take 100 mg by mouth 3 (three) times daily.      hydrochlorothiazide (HYDRODIURIL) 25 MG tablet Take 25 mg by mouth daily with lunch.      ipratropium-albuterol (DUONEB) 0.5-2.5 (3) MG/3ML SOLN Take 3 mLs by nebulization 2 (two) times daily. 360 mL 0   labetalol (NORMODYNE) 300 MG tablet Take 600 mg by mouth 2 (two) times daily.      Lactobacillus (PROBIOTIC ACIDOPHILUS PO) Take 2 tablets by mouth daily.     Melatonin 10 MG TABS Take 20 mg by mouth at bedtime as needed (sleep).      Multiple Vitamin (MULTIVITAMIN WITH MINERALS) TABS tablet Take 1 tablet by mouth at bedtime.     Multiple Vitamins-Iron (CHLORELLA PO) Take 4 g by mouth daily.     ondansetron (ZOFRAN) 4 MG tablet Take 1 tablet (4 mg total) by mouth every 8 (eight) hours as needed for nausea or vomiting. 20 tablet 0   OVER THE COUNTER MEDICATION Take 2 capsules by mouth daily. Dim Supreme vegetarian capsules (from cruciferous vegetables)     pantoprazole (PROTONIX) 40 MG tablet Take 1 tablet (40 mg total) by mouth daily for 14 days. 14 tablet 0   polyethylene glycol powder (GLYCOLAX/MIRALAX) 17 GM/SCOOP powder Take 17 g by mouth 2 (two) times daily as needed. (Patient taking differently: Take 17 g by mouth 2 (two) times daily as needed for mild constipation. ) 3350 g 1   senna-docusate (SENOKOT-S) 8.6-50 MG tablet Take 1 tablet by mouth 2 (two) times daily. 10 tablet 0   traMADol (ULTRAM) 50 MG tablet Take 1 tablet (50 mg total) by mouth every 6 (six) hours as needed. 30 tablet 0   Turmeric POWD Take 7.5 mLs by mouth at bedtime.      No current facility-administered medications for this visit.     PHYSICAL EXAMINATION: ECOG PERFORMANCE STATUS: 1 - Symptomatic but completely ambulatory  Vitals:   12/25/18 1509  BP: (!) 165/74  Pulse: 69  Resp: 20  Temp: 98.9 F (37.2 C)  SpO2: 99%   Filed Weights   12/25/18 1509  Weight: 268 lb 12.8 oz (121.9 kg)    Exam not performed  due to COVID-19 restrictions LABORATORY DATA:  I have reviewed the data as listed CMP Latest Ref Rng & Units 11/22/2018 11/21/2018 11/21/2018  Glucose 70 - 99 mg/dL 110(H) - 112(H)  BUN 6 - 20 mg/dL 11 - 10  Creatinine 0.44 - 1.00 mg/dL 0.99 1.08(H) 1.05(H)  Sodium 135 - 145 mmol/L 138 - 136  Potassium 3.5 - 5.1 mmol/L 3.2(L) - 3.3(L)  Chloride 98 - 111 mmol/L 100 - 99  CO2 22 - 32 mmol/L 24 - 23  Calcium 8.9 - 10.3 mg/dL 8.5(L) - 8.9  Total Protein 6.5 - 8.1 g/dL 7.5 - 8.2(H)  Total Bilirubin 0.3 - 1.2 mg/dL 0.7 - 1.0  Alkaline Phos 38 - 126 U/L 63 - 67  AST 15 - 41 U/L 19 - 20  ALT 0 - 44 U/L 18 - 19    Lab Results  Component Value  Date   WBC 8.4 12/25/2018   HGB 8.4 (L) 12/25/2018   HCT 28.4 (L) 12/25/2018   MCV 82.1 12/25/2018   PLT 364 12/25/2018   NEUTROABS 5.3 12/25/2018    ASSESSMENT & PLAN:  Cancer of upper-outer quadrant of female breast ([place]) Right breast invasive lobular cancer grade 1-2, ER 99%, PR 99%, HER-2 negative Ratio 1.3, Ki-67 13%, initial MRI revealed 7.1 cm area of mass or non-mass enhancement. Current treatment: Zoladex (noncompliant, last given March 2019 but had not received many doses), anastrozole 1 mg daily Patient never had surgery because she does not wish to undergo mastectomy.  Recommendation: 1.    Restart Zoladex with anastrozole.  I recommended the monthly Zoladex.  I discussed with her that if she does not come monthly for Zoladex and anastrozole will not have any major effect on her breast cancer. 2.  We will need to obtain a mammogram in the next month and a breast MRI towards end of the year.   Microcytic anemia Chronic microcytic iron deficiency anemia: Possibly related to heavy menstrual bleeding versus malabsorption.  Patient has been on oral iron therapy for the last 20 years  IV iron treatment: Given March 2019  Lab review: Hemoglobin 8.4, MCV 82.1 Awaiting iron studies. I will call her with the results of the studies.  I will  see her back in 6 months for follow-up  Orders Placed This Encounter  Procedures   MM DIAG BREAST TOMO BILATERAL    Standing Status:   Future    Standing Expiration Date:   12/25/2019    Order Specific Question:   Reason for Exam (SYMPTOM  OR DIAGNOSIS REQUIRED)    Answer:   Annual 3 D mammogram with breast cancer history    Order Specific Question:   Is the patient pregnant?    Answer:   No    Order Specific Question:   Preferred imaging location?    Answer:   [facility]   MR BREAST BILATERAL [address] [place] CAD    Standing Status:   Future    Standing Expiration Date:   02/24/2020    Order Specific Question:   ** REASON FOR EXAM (FREE TEXT)    Answer:   Unresected breast cancer evaluation on anti estrogen therapy    Order Specific Question:   If indicated for the ordered procedure, I authorize the administration of contrast media per Radiology protocol    Answer:   Yes    Order Specific Question:   What is the patient's sedation requirement?    Answer:   No Sedation    Order Specific Question:   Does the patient have a pacemaker or implanted devices?    Answer:   No    Order Specific Question:   Radiology Contrast Protocol - do NOT remove file path    Answer:   \[email or web address]    Order Specific Question:   Preferred imaging location?    Answer:   [facility] (table limit-550lbs)   The patient has a good understanding of the overall plan. she agrees with it. she will call with any problems that may develop before the next visit here.  Nicholas Lose, MD 12/25/2018  Julious Oka Dorshimer am acting as scribe for Dr. Nicholas Lose.  I have reviewed the above documentation for accuracy and completeness, and I agree with the above.

## 2018-12-25 ENCOUNTER — Other Ambulatory Visit: Payer: Self-pay

## 2018-12-25 ENCOUNTER — Inpatient Hospital Stay: Payer: 59 | Attending: Hematology and Oncology | Admitting: Hematology and Oncology

## 2018-12-25 ENCOUNTER — Inpatient Hospital Stay: Payer: 59

## 2018-12-25 VITALS — BP 165/74 | HR 69 | Temp 98.9°F | Resp 20 | Ht 64.0 in | Wt 268.8 lb

## 2018-12-25 DIAGNOSIS — Z79811 Long term (current) use of aromatase inhibitors: Secondary | ICD-10-CM | POA: Diagnosis not present

## 2018-12-25 DIAGNOSIS — D509 Iron deficiency anemia, unspecified: Secondary | ICD-10-CM

## 2018-12-25 DIAGNOSIS — Z1231 Encounter for screening mammogram for malignant neoplasm of breast: Secondary | ICD-10-CM

## 2018-12-25 DIAGNOSIS — C50411 Malignant neoplasm of upper-outer quadrant of right female breast: Secondary | ICD-10-CM | POA: Diagnosis not present

## 2018-12-25 DIAGNOSIS — Z170 Estrogen receptor positive status [ER+]: Secondary | ICD-10-CM | POA: Diagnosis not present

## 2018-12-25 DIAGNOSIS — Z5111 Encounter for antineoplastic chemotherapy: Secondary | ICD-10-CM | POA: Insufficient documentation

## 2018-12-25 DIAGNOSIS — Z79899 Other long term (current) drug therapy: Secondary | ICD-10-CM | POA: Diagnosis not present

## 2018-12-25 LAB — CBC WITH DIFFERENTIAL (CANCER CENTER ONLY)
Abs Immature Granulocytes: 0.02 10*3/uL (ref 0.00–0.07)
Basophils Absolute: 0 10*3/uL (ref 0.0–0.1)
Basophils Relative: 1 %
Eosinophils Absolute: 0.1 10*3/uL (ref 0.0–0.5)
Eosinophils Relative: 1 %
HCT: 28.4 % — ABNORMAL LOW (ref 36.0–46.0)
Hemoglobin: 8.4 g/dL — ABNORMAL LOW (ref 12.0–15.0)
Immature Granulocytes: 0 %
Lymphocytes Relative: 29 %
Lymphs Abs: 2.4 10*3/uL (ref 0.7–4.0)
MCH: 24.3 pg — ABNORMAL LOW (ref 26.0–34.0)
MCHC: 29.6 g/dL — ABNORMAL LOW (ref 30.0–36.0)
MCV: 82.1 fL (ref 80.0–100.0)
Monocytes Absolute: 0.6 10*3/uL (ref 0.1–1.0)
Monocytes Relative: 7 %
Neutro Abs: 5.3 10*3/uL (ref 1.7–7.7)
Neutrophils Relative %: 62 %
Platelet Count: 364 10*3/uL (ref 150–400)
RBC: 3.46 MIL/uL — ABNORMAL LOW (ref 3.87–5.11)
RDW: 16.4 % — ABNORMAL HIGH (ref 11.5–15.5)
WBC Count: 8.4 10*3/uL (ref 4.0–10.5)
nRBC: 0 % (ref 0.0–0.2)

## 2018-12-25 LAB — CMP (CANCER CENTER ONLY)
ALT: 23 U/L (ref 0–44)
AST: 24 U/L (ref 15–41)
Albumin: 3.9 g/dL (ref 3.5–5.0)
Alkaline Phosphatase: 63 U/L (ref 38–126)
Anion gap: 8 (ref 5–15)
BUN: 16 mg/dL (ref 6–20)
CO2: 27 mmol/L (ref 22–32)
Calcium: 8.8 mg/dL — ABNORMAL LOW (ref 8.9–10.3)
Chloride: 106 mmol/L (ref 98–111)
Creatinine: 1.17 mg/dL — ABNORMAL HIGH (ref 0.44–1.00)
GFR, Est AFR Am: >60 mL/min (ref >60)
GFR, Estimated: 54 mL/min — ABNORMAL LOW (ref >60)
Glucose, Bld: 96 mg/dL (ref 70–99)
Potassium: 3.1 mmol/L — ABNORMAL LOW (ref 3.5–5.1)
Sodium: 141 mmol/L (ref 135–145)
Total Bilirubin: 0.5 mg/dL (ref 0.3–1.2)
Total Protein: 7.6 g/dL (ref 6.5–8.1)

## 2018-12-25 MED ORDER — GOSERELIN ACETATE 3.6 MG ~~LOC~~ IMPL
DRUG_IMPLANT | SUBCUTANEOUS | Status: AC
  Filled 2018-12-25: qty 3.6

## 2018-12-25 MED ORDER — GOSERELIN ACETATE 3.6 MG ~~LOC~~ IMPL
3.6000 mg | DRUG_IMPLANT | Freq: Once | SUBCUTANEOUS | Status: AC
  Administered 2018-12-25: 16:00:00 3.6 mg via SUBCUTANEOUS

## 2018-12-26 ENCOUNTER — Other Ambulatory Visit: Payer: Self-pay | Admitting: Hematology and Oncology

## 2018-12-26 ENCOUNTER — Telehealth: Payer: Self-pay | Admitting: Hematology and Oncology

## 2018-12-26 ENCOUNTER — Encounter: Payer: Self-pay | Admitting: Gastroenterology

## 2018-12-26 LAB — IRON AND TIBC
Iron: 18 ug/dL — ABNORMAL LOW (ref 41–142)
Saturation Ratios: 4 % — ABNORMAL LOW (ref 21–57)
TIBC: 430 ug/dL (ref 236–444)
UIBC: 412 ug/dL — ABNORMAL HIGH (ref 120–384)

## 2018-12-26 LAB — FERRITIN: Ferritin: 5 ng/mL — ABNORMAL LOW (ref 11–307)

## 2018-12-26 NOTE — Telephone Encounter (Signed)
Called regarding schedule °

## 2018-12-26 NOTE — Progress Notes (Signed)
I reviewed the lab work with the patient and she has a ferritin of 5 with iron saturation of 4%.  I recommended 2 doses of IV iron.

## 2018-12-26 NOTE — Telephone Encounter (Signed)
Scheduled appt per 5/28 sch message - pt is aware of appt date and time.  

## 2018-12-27 ENCOUNTER — Inpatient Hospital Stay: Payer: 59

## 2018-12-27 ENCOUNTER — Other Ambulatory Visit: Payer: Self-pay

## 2018-12-27 VITALS — BP 151/70 | HR 60 | Temp 98.5°F | Resp 16

## 2018-12-27 DIAGNOSIS — Z5111 Encounter for antineoplastic chemotherapy: Secondary | ICD-10-CM | POA: Diagnosis not present

## 2018-12-27 DIAGNOSIS — D509 Iron deficiency anemia, unspecified: Secondary | ICD-10-CM

## 2018-12-27 MED ORDER — SODIUM CHLORIDE 0.9 % IV SOLN
Freq: Once | INTRAVENOUS | Status: AC
  Administered 2018-12-27: 15:00:00 via INTRAVENOUS
  Filled 2018-12-27: qty 250

## 2018-12-27 MED ORDER — SODIUM CHLORIDE 0.9 % IV SOLN
510.0000 mg | Freq: Once | INTRAVENOUS | Status: AC
  Administered 2018-12-27: 16:00:00 510 mg via INTRAVENOUS
  Filled 2018-12-27: qty 17

## 2018-12-27 NOTE — Progress Notes (Signed)
Okay to treat with iron with BP 189/76 per Sandi Mealy.

## 2018-12-27 NOTE — Patient Instructions (Signed)

## 2018-12-31 ENCOUNTER — Other Ambulatory Visit: Payer: Self-pay | Admitting: Internal Medicine

## 2019-01-01 NOTE — Telephone Encounter (Signed)
You have never prescribed RX, please advise

## 2019-01-02 ENCOUNTER — Other Ambulatory Visit: Payer: Self-pay | Admitting: Hematology and Oncology

## 2019-01-02 ENCOUNTER — Other Ambulatory Visit: Payer: Self-pay

## 2019-01-02 ENCOUNTER — Ambulatory Visit
Admission: RE | Admit: 2019-01-02 | Discharge: 2019-01-02 | Disposition: A | Discharge status: Home / Self Care | Payer: 59 | Source: Ambulatory Visit | Attending: Hematology and Oncology | Admitting: Hematology and Oncology

## 2019-01-02 DIAGNOSIS — C50411 Malignant neoplasm of upper-outer quadrant of right female breast: Secondary | ICD-10-CM

## 2019-01-03 ENCOUNTER — Inpatient Hospital Stay (HOSPITAL_BASED_OUTPATIENT_CLINIC_OR_DEPARTMENT_OTHER): Payer: 59 | Admitting: Hematology and Oncology

## 2019-01-03 ENCOUNTER — Inpatient Hospital Stay: Payer: 59 | Attending: Hematology and Oncology

## 2019-01-03 ENCOUNTER — Telehealth: Payer: Self-pay | Admitting: Internal Medicine

## 2019-01-03 ENCOUNTER — Ambulatory Visit (AMBULATORY_SURGERY_CENTER): Payer: Self-pay | Admitting: *Deleted

## 2019-01-03 ENCOUNTER — Other Ambulatory Visit: Payer: Self-pay

## 2019-01-03 VITALS — Ht 64.0 in | Wt 268.0 lb

## 2019-01-03 VITALS — BP 125/64 | HR 60 | Temp 98.4°F | Resp 18 | Wt 265.5 lb

## 2019-01-03 DIAGNOSIS — Z170 Estrogen receptor positive status [ER+]: Secondary | ICD-10-CM

## 2019-01-03 DIAGNOSIS — Z79811 Long term (current) use of aromatase inhibitors: Secondary | ICD-10-CM | POA: Insufficient documentation

## 2019-01-03 DIAGNOSIS — D509 Iron deficiency anemia, unspecified: Secondary | ICD-10-CM | POA: Insufficient documentation

## 2019-01-03 DIAGNOSIS — Z5111 Encounter for antineoplastic chemotherapy: Secondary | ICD-10-CM | POA: Insufficient documentation

## 2019-01-03 DIAGNOSIS — C50411 Malignant neoplasm of upper-outer quadrant of right female breast: Secondary | ICD-10-CM | POA: Diagnosis not present

## 2019-01-03 DIAGNOSIS — Z1211 Encounter for screening for malignant neoplasm of colon: Secondary | ICD-10-CM

## 2019-01-03 MED ORDER — SODIUM CHLORIDE 0.9 % IV SOLN
Freq: Once | INTRAVENOUS | Status: AC
  Administered 2019-01-03: 14:00:00 via INTRAVENOUS
  Filled 2019-01-03: qty 250

## 2019-01-03 MED ORDER — SODIUM CHLORIDE 0.9 % IV SOLN
510.0000 mg | Freq: Once | INTRAVENOUS | Status: AC
  Administered 2019-01-03: 510 mg via INTRAVENOUS
  Filled 2019-01-03: qty 17

## 2019-01-03 MED ORDER — NA SULFATE-K SULFATE-MG SULF 17.5-3.13-1.6 GM/177ML PO SOLN: 1.0000 | Freq: Once | ORAL | 0 refills | Status: AC

## 2019-01-03 NOTE — Progress Notes (Signed)
No egg or soy allergy known to patient  No issues with past sedation with any surgeries  or procedures, no intubation problems  No diet pills per patient No home 02 use per patient  No blood thinners per patient  Pt denies issues with constipation - pt states she typically is regular  No A fib or A flutter  EMMI video sent to pt's e mail    Pt verified name, DOB, address and insurance during PV today. Pt mailed instruction packet to included paper to complete and mail back to [facility] with addressed and stamped envelope, Emmi video, copy of consent form to read and not return, and instructions. Suprep $15  coupon mailed in packet. PV completed over the phone. Pt encouraged to call with questions or issues

## 2019-01-03 NOTE — Progress Notes (Signed)
HEMATOLOGY-ONCOLOGY [name] VISIT PROGRESS NOTE  I connected with [name] on 01/03/2019 at 11:15 AM EDT by Doximity video conference and verified that I am speaking with the correct person using two identifiers.  I discussed the limitations, risks, security and privacy concerns of performing an evaluation and management service by Doximity and the availability of in person appointments.  I also discussed with the patient that there may be a patient responsible charge related to this service. The patient expressed understanding and agreed to proceed.  Patient's Location: Home Physician Location: Clinic  CHIEF COMPLIANT:   INTERVAL HISTORY: Jasmine Stafford is a 53 y.o. female with above-mentioned history of     Cancer of upper-outer quadrant of female breast ([place])   08/09/2012 Initial Diagnosis    Right breast biopsy 10 o'clock position: Invasive lobular cancer, ALH, grade 1-2, ER 99%, PR 99%, Ki-67 13%, HER-2 negative ratio 1.3    09/26/2012 -  Anti-estrogen oral therapy    Zoladex with anastrozole prescribed at cancer treatment centers of [place] along with lots of herbs.  Annual mammograms and MRIs showed stable findings.  Patient never had surgery     REVIEW OF SYSTEMS:   Constitutional: Denies fevers, chills or abnormal weight loss Eyes: Denies blurriness of vision Ears, nose, mouth, throat, and face: Denies mucositis or sore throat Respiratory: Denies cough, dyspnea or wheezes Cardiovascular: Denies palpitation, chest discomfort Gastrointestinal:  Denies nausea, heartburn or change in bowel habits Skin: Denies abnormal skin rashes Lymphatics: Denies new lymphadenopathy or easy bruising Neurological:Denies numbness, tingling or new weaknesses Behavioral/Psych: Mood is stable, no new changes  Extremities: No lower extremity edema Breast: denies any pain or lumps or nodules in either breasts All other systems were reviewed with the patient and are negative.   Observations/Objective:  There were no vitals filed for this visit. There is no height or weight on file to calculate BMI.  I have reviewed the data as listed CMP Latest Ref Rng & Units 12/25/2018 11/22/2018 11/21/2018  Glucose 70 - 99 mg/dL 96 110(H) -  BUN 6 - 20 mg/dL 16 11 -  Creatinine 0.44 - 1.00 mg/dL 1.17(H) 0.99 1.08(H)  Sodium 135 - 145 mmol/L 141 138 -  Potassium 3.5 - 5.1 mmol/L 3.1(L) 3.2(L) -  Chloride 98 - 111 mmol/L 106 100 -  CO2 22 - 32 mmol/L 27 24 -  Calcium 8.9 - 10.3 mg/dL 8.8(L) 8.5(L) -  Total Protein 6.5 - 8.1 g/dL 7.6 7.5 -  Total Bilirubin 0.3 - 1.2 mg/dL 0.5 0.7 -  Alkaline Phos 38 - 126 U/L 63 63 -  AST 15 - 41 U/L 24 19 -  ALT 0 - 44 U/L 23 18 -    Lab Results  Component Value Date   WBC 8.4 12/25/2018   HGB 8.4 (L) 12/25/2018   HCT 28.4 (L) 12/25/2018   MCV 82.1 12/25/2018   PLT 364 12/25/2018   NEUTROABS 5.3 12/25/2018      Assessment Plan:  Cancer of upper-outer quadrant of female breast ([place]) Right breast invasive lobular cancer grade 1-2, ER 99%, PR 99%, HER-2 negative Ratio 1.3, Ki-67 13%, initial MRI revealed 7.1 cm area of mass or non-mass enhancement. Current treatment: Zoladex (noncompliant, last given March 2019 but had not received many doses),anastrozole 1 mg daily Patient never had surgery because she does not wish to undergo mastectomy.  Recommendation: 1.  Restarted Zoladex with anastrozole. I recommended the monthly Zoladex.  I discussed with her that if she does not  come monthly for Zoladex and anastrozole will not have any major effect on her breast cancer.  Mammogram and ultrasound 01/02/2019: Interval worsening of known invasive lobular cancer increased from 1.8cm to 2.2 cm.  Patchy echogenicity 4.2 cm, retroareolar indistinct hypoechoic area and abnormal fibroglandular tissue throughout the upper inner quadrant right breast.  Left breast is normal.  Right axilla 3.8 mm lymph node.  Radiology counseling: I discussed with  the patient that the scans indicate worsening of disease which is quite expected given the fact that she was not receiving Zoladex and was only taking anastrozole while she is premenopausal.  She will continue with her herbal supplements and other medication that she is currently using and will also continue with Zoladex and anastrozole.  She has an MRI planned for the end of the year and we will see what kind of improvement we get with complete estrogen blockade.  We will see her back in November after the MRI of the breast.    I discussed the assessment and treatment plan with the patient. The patient was provided an opportunity to ask questions and all were answered. The patient agreed with the plan and demonstrated an understanding of the instructions. The patient was advised to call back or seek an in-person evaluation if the symptoms worsen or if the condition fails to improve as anticipated.   I provided 15 minutes of face-to-face Doximity video time during this encounter.    Rulon Eisenmenger, MD 01/03/2019

## 2019-01-03 NOTE — Telephone Encounter (Signed)
Copied from [place] [phone]. Topic: Quick Communication - Rx Refill/Question >> Jan 03, 2019  1:36 PM [place], [place] D wrote: Medication:hydrALAZINE (APRESOLINE) 100 MG tablet /labetalol (NORMODYNE) 300 MG tablet /Pt will be out of medication as of this evening  Has the patient contacted their pharmacy? Yes.   (Agent: If no, request that the patient contact the pharmacy for the refill.) (Agent: If yes, when and what did the pharmacy advise?)  Preferred Pharmacy (with phone number or street name): [id number] [address], [place] [phone] (Phone) [phone] (Fax)    Agent: Please be advised that RX refills may take up to 3 business days. We ask that you follow-up with your pharmacy.

## 2019-01-03 NOTE — Assessment & Plan Note (Signed)
Right breast invasive lobular cancer grade 1-2, ER 99%, PR 99%, HER-2 negative Ratio 1.3, Ki-67 13%, initial MRI revealed 7.1 cm area of mass or non-mass enhancement. Current treatment: Zoladex (noncompliant, last given March 2019 but had not received many doses),anastrozole 1 mg daily Patient never had surgery because she does not wish to undergo mastectomy.  Recommendation: 1.  Restarted Zoladex with anastrozole. I recommended the monthly Zoladex.  I discussed with her that if she does not come monthly for Zoladex and anastrozole will not have any major effect on her breast cancer.  Mammogram and ultrasound 01/02/2019: Interval worsening of known invasive lobular cancer increased from 1.8cm to 2.2 cm.  Patchy echogenicity 4.2 cm, retroareolar indistinct hypoechoic area and abnormal fibroglandular tissue throughout the upper inner quadrant right breast.  Left breast is normal.  Right axilla 3.8 mm lymph node.  Radiology counseling: I discussed with the patient that the scans indicate worsening of disease which is quite expected given the fact that she was not receiving Zoladex and was only taking anastrozole while she is premenopausal.  She will continue with her herbal supplements and other medication that she is currently using and will also continue with Zoladex and anastrozole.  She has an MRI planned for the end of the year and we will see what kind of improvement we get with complete estrogen blockade.  We will see her back in November after the MRI of the breast.

## 2019-01-03 NOTE — Patient Instructions (Signed)

## 2019-01-06 MED ORDER — HYDRALAZINE HCL 100 MG PO TABS: 100.0000 mg | ORAL_TABLET | Freq: Three times a day (TID) | ORAL | 0 refills | Status: DC

## 2019-01-06 MED ORDER — LABETALOL HCL 300 MG PO TABS: 600.0000 mg | ORAL_TABLET | Freq: Two times a day (BID) | ORAL | 0 refills | Status: DC

## 2019-01-06 NOTE — Telephone Encounter (Signed)
Pt is due for follow-up appt per MD last note. Sent 30 day supply until appt.Marland KitchenJohny Chess

## 2019-01-09 ENCOUNTER — Encounter: Payer: Self-pay | Admitting: Gastroenterology

## 2019-01-14 ENCOUNTER — Other Ambulatory Visit: Payer: Self-pay | Admitting: Internal Medicine

## 2019-01-14 MED ORDER — LABETALOL HCL 300 MG PO TABS: 600.0000 mg | ORAL_TABLET | Freq: Two times a day (BID) | ORAL | 1 refills | Status: DC

## 2019-01-14 NOTE — Addendum Note (Signed)
Addended by: Karren Cobble on: 01/14/2019 04:09 PM   Modules accepted: Orders

## 2019-01-17 ENCOUNTER — Encounter: Payer: 59 | Admitting: Gastroenterology

## 2019-01-24 ENCOUNTER — Inpatient Hospital Stay: Payer: 59

## 2019-01-24 ENCOUNTER — Other Ambulatory Visit: Payer: Self-pay

## 2019-01-24 VITALS — BP 131/70 | HR 60 | Temp 99.0°F | Resp 18

## 2019-01-24 DIAGNOSIS — Z5111 Encounter for antineoplastic chemotherapy: Secondary | ICD-10-CM | POA: Diagnosis not present

## 2019-01-24 DIAGNOSIS — D509 Iron deficiency anemia, unspecified: Secondary | ICD-10-CM

## 2019-01-24 MED ORDER — GOSERELIN ACETATE 3.6 MG ~~LOC~~ IMPL
DRUG_IMPLANT | SUBCUTANEOUS | Status: AC
  Filled 2019-01-24: qty 3.6

## 2019-01-24 MED ORDER — GOSERELIN ACETATE 3.6 MG ~~LOC~~ IMPL
3.6000 mg | DRUG_IMPLANT | Freq: Once | SUBCUTANEOUS | Status: AC
  Administered 2019-01-24: 3.6 mg via SUBCUTANEOUS

## 2019-01-24 NOTE — Patient Instructions (Signed)
Goserelin injection What is this medicine? GOSERELIN (GOE se rel in) is similar to a hormone found in the body. It lowers the amount of sex hormones that the body makes. Men will have lower testosterone levels and women will have lower estrogen levels while taking this medicine. In men, this medicine is used to treat prostate cancer; the injection is either given once per month or once every 12 weeks. A once per month injection (only) is used to treat women with endometriosis, dysfunctional uterine bleeding, or advanced breast cancer. This medicine may be used for other purposes; ask your health care provider or pharmacist if you have questions. COMMON BRAND NAME(S): Zoladex What should I tell my health care provider before I take this medicine? They need to know if you have any of these conditions (some only apply to women): -diabetes -heart disease or previous heart attack -high blood pressure -high cholesterol -kidney disease -osteoporosis or low bone density -problems passing urine -spinal cord injury -stroke -tobacco smoker -an unusual or allergic reaction to goserelin, hormone therapy, other medicines, foods, dyes, or preservatives -pregnant or trying to get pregnant -breast-feeding How should I use this medicine? This medicine is for injection under the skin. It is given by a health care professional in a hospital or clinic setting. Men receive this injection once every 4 weeks or once every 12 weeks. Women will only receive the once every 4 weeks injection. Talk to your pediatrician regarding the use of this medicine in children. Special care may be needed. Overdosage: If you think you have taken too much of this medicine contact a poison control center or emergency room at once. NOTE: This medicine is only for you. Do not share this medicine with others. What if I miss a dose? It is important not to miss your dose. Call your doctor or health care professional if you are unable to  keep an appointment. What may interact with this medicine? -female hormones like estrogen -herbal or dietary supplements like black cohosh, chasteberry, or DHEA -female hormones like testosterone -prasterone This list may not describe all possible interactions. Give your health care provider a list of all the medicines, herbs, non-prescription drugs, or dietary supplements you use. Also tell them if you smoke, drink alcohol, or use illegal drugs. Some items may interact with your medicine. What should I watch for while using this medicine? Visit your doctor or health care professional for regular checks on your progress. Your symptoms may appear to get worse during the first weeks of this therapy. Tell your doctor or healthcare professional if your symptoms do not start to get better or if they get worse after this time. Your bones may get weaker if you take this medicine for a long time. If you smoke or frequently drink alcohol you may increase your risk of bone loss. A family history of osteoporosis, chronic use of drugs for seizures (convulsions), or corticosteroids can also increase your risk of bone loss. Talk to your doctor about how to keep your bones strong. This medicine should stop regular monthly menstration in women. Tell your doctor if you continue to menstrate. Women should not become pregnant while taking this medicine or for 12 weeks after stopping this medicine. Women should inform their doctor if they wish to become pregnant or think they might be pregnant. There is a potential for serious side effects to an unborn child. Talk to your health care professional or pharmacist for more information. Do not breast-feed an infant while taking   this medicine. Men should inform their doctors if they wish to father a child. This medicine may lower sperm counts. Talk to your health care professional or pharmacist for more information. What side effects may I notice from receiving this  medicine? Side effects that you should report to your doctor or health care professional as soon as possible: -allergic reactions like skin rash, itching or hives, swelling of the face, lips, or tongue -bone pain -breathing problems -changes in vision -chest pain -feeling faint or lightheaded, falls -fever, chills -pain, swelling, warmth in the leg -pain, tingling, numbness in the hands or feet -signs and symptoms of low blood pressure like dizziness; feeling faint or lightheaded, falls; unusually weak or tired -stomach pain -swelling of the ankles, feet, hands -trouble passing urine or change in the amount of urine -unusually high or low blood pressure -unusually weak or tired Side effects that usually do not require medical attention (report to your doctor or health care professional if they continue or are bothersome): -change in sex drive or performance -changes in breast size in both males and females -changes in emotions or moods -headache -hot flashes -irritation at site where injected -loss of appetite -skin problems like acne, dry skin -vaginal dryness This list may not describe all possible side effects. Call your doctor for medical advice about side effects. You may report side effects to FDA at 1-800-FDA-1088. Where should I keep my medicine? This drug is given in a hospital or clinic and will not be stored at home. NOTE: This sheet is a summary. It may not cover all possible information. If you have questions about this medicine, talk to your doctor, pharmacist, or health care provider.  2019 Elsevier/Gold Standard (2013-09-23 11:10:35)  

## 2019-02-05 ENCOUNTER — Other Ambulatory Visit: Payer: Self-pay | Admitting: Internal Medicine

## 2019-02-24 ENCOUNTER — Other Ambulatory Visit: Payer: Self-pay

## 2019-02-24 ENCOUNTER — Inpatient Hospital Stay: Payer: 59 | Attending: Hematology and Oncology

## 2019-02-24 VITALS — BP 194/89 | HR 53 | Temp 98.0°F | Resp 18

## 2019-02-24 DIAGNOSIS — C50411 Malignant neoplasm of upper-outer quadrant of right female breast: Secondary | ICD-10-CM | POA: Insufficient documentation

## 2019-02-24 DIAGNOSIS — Z5111 Encounter for antineoplastic chemotherapy: Secondary | ICD-10-CM | POA: Diagnosis present

## 2019-02-24 DIAGNOSIS — D509 Iron deficiency anemia, unspecified: Secondary | ICD-10-CM

## 2019-02-24 MED ORDER — GOSERELIN ACETATE 3.6 MG ~~LOC~~ IMPL
3.6000 mg | DRUG_IMPLANT | Freq: Once | SUBCUTANEOUS | Status: AC
  Administered 2019-02-24: 15:00:00 3.6 mg via SUBCUTANEOUS

## 2019-02-24 NOTE — Patient Instructions (Signed)

## 2019-02-25 ENCOUNTER — Telehealth: Payer: Self-pay | Admitting: Gastroenterology

## 2019-02-25 NOTE — Telephone Encounter (Signed)
No answer and mailbox is full, unable to leave message regarding Covid-19 screening questions. Covid-19 Screening Questions:  Do you now or have you had a fever in the last 14 days?   Do you have any respiratory symptoms of shortness of breath or cough now or in the last 14 days?   Do you have any family members or close contacts with diagnosed or suspected Covid-19 in the past 14 days?   Have you been tested for Covid-19 and found to be positive?

## 2019-02-25 NOTE — Telephone Encounter (Signed)
Left message for patient to call back regarding Covid-19 screening questions. Covid-19 Screening Questions:  Do you now or have you had a fever in the last 14 days?   Do you have any respiratory symptoms of shortness of breath or cough now or in the last 14 days?   Do you have any family members or close contacts with diagnosed or suspected Covid-19 in the past 14 days?   Have you been tested for Covid-19 and found to be positive?

## 2019-02-26 ENCOUNTER — Telehealth: Payer: Self-pay | Admitting: Gastroenterology

## 2019-02-26 ENCOUNTER — Encounter: Payer: 59 | Admitting: Gastroenterology

## 2019-03-22 ENCOUNTER — Other Ambulatory Visit: Payer: Self-pay

## 2019-03-22 ENCOUNTER — Encounter (HOSPITAL_COMMUNITY): Payer: Self-pay | Admitting: *Deleted

## 2019-03-22 ENCOUNTER — Emergency Department (HOSPITAL_COMMUNITY)
Admission: EM | Admit: 2019-03-22 | Discharge: 2019-03-22 | Disposition: A | Discharge status: Home / Self Care | Payer: 59 | Attending: Emergency Medicine | Admitting: Emergency Medicine

## 2019-03-22 ENCOUNTER — Emergency Department (HOSPITAL_COMMUNITY): Payer: 59

## 2019-03-22 DIAGNOSIS — Y92013 Bedroom of single-family (private) house as the place of occurrence of the external cause: Secondary | ICD-10-CM | POA: Insufficient documentation

## 2019-03-22 DIAGNOSIS — Y9384 Activity, sleeping: Secondary | ICD-10-CM | POA: Insufficient documentation

## 2019-03-22 DIAGNOSIS — S098XXA Other specified injuries of head, initial encounter: Secondary | ICD-10-CM | POA: Diagnosis not present

## 2019-03-22 DIAGNOSIS — Z79899 Other long term (current) drug therapy: Secondary | ICD-10-CM | POA: Diagnosis not present

## 2019-03-22 DIAGNOSIS — S0990XA Unspecified injury of head, initial encounter: Secondary | ICD-10-CM

## 2019-03-22 DIAGNOSIS — I10 Essential (primary) hypertension: Secondary | ICD-10-CM | POA: Diagnosis not present

## 2019-03-22 DIAGNOSIS — Y999 Unspecified external cause status: Secondary | ICD-10-CM | POA: Diagnosis not present

## 2019-03-22 DIAGNOSIS — Z853 Personal history of malignant neoplasm of breast: Secondary | ICD-10-CM | POA: Insufficient documentation

## 2019-03-22 MED ORDER — IBUPROFEN 400 MG PO TABS
400.0000 mg | ORAL_TABLET | Freq: Once | ORAL | Status: AC
  Administered 2019-03-22: 400 mg via ORAL
  Filled 2019-03-22: qty 1

## 2019-03-22 NOTE — ED Notes (Signed)
Patient verbalizes understanding of discharge instructions. Opportunity for questioning and answering were provided.  patient discharged from ED.  
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
## 2019-03-22 NOTE — ED Notes (Signed)
Patient is resting comfortably. 
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
## 2019-03-22 NOTE — Discharge Instructions (Signed)
Take Ibuprofen as needed for headache Please follow up with your doctor and mental health provider Return if worsening

## 2019-03-27 ENCOUNTER — Other Ambulatory Visit: Payer: Self-pay

## 2019-03-27 ENCOUNTER — Inpatient Hospital Stay: Payer: 59 | Attending: Hematology and Oncology

## 2019-03-27 VITALS — BP 113/58 | HR 61 | Temp 98.5°F | Resp 18

## 2019-03-27 DIAGNOSIS — C50411 Malignant neoplasm of upper-outer quadrant of right female breast: Secondary | ICD-10-CM | POA: Insufficient documentation

## 2019-03-27 DIAGNOSIS — D509 Iron deficiency anemia, unspecified: Secondary | ICD-10-CM

## 2019-03-27 DIAGNOSIS — Z5111 Encounter for antineoplastic chemotherapy: Secondary | ICD-10-CM | POA: Insufficient documentation

## 2019-03-27 MED ORDER — GOSERELIN ACETATE 3.6 MG ~~LOC~~ IMPL
3.6000 mg | DRUG_IMPLANT | Freq: Once | SUBCUTANEOUS | Status: AC
  Administered 2019-03-27: 15:00:00 3.6 mg via SUBCUTANEOUS
  Filled 2019-03-27: qty 3.6

## 2019-03-27 MED ORDER — GOSERELIN ACETATE 3.6 MG ~~LOC~~ IMPL
DRUG_IMPLANT | SUBCUTANEOUS | Status: AC
  Filled 2019-03-27: qty 3.6

## 2019-03-27 NOTE — Patient Instructions (Signed)

## 2019-04-05 ENCOUNTER — Other Ambulatory Visit: Payer: Self-pay | Admitting: Hematology and Oncology

## 2019-04-28 ENCOUNTER — Inpatient Hospital Stay: Payer: 59 | Attending: Hematology and Oncology

## 2019-04-28 ENCOUNTER — Other Ambulatory Visit: Payer: Self-pay

## 2019-04-28 VITALS — BP 134/73 | HR 57 | Temp 98.5°F | Resp 18

## 2019-04-28 DIAGNOSIS — D509 Iron deficiency anemia, unspecified: Secondary | ICD-10-CM

## 2019-04-28 DIAGNOSIS — Z5111 Encounter for antineoplastic chemotherapy: Secondary | ICD-10-CM | POA: Insufficient documentation

## 2019-04-28 DIAGNOSIS — C50411 Malignant neoplasm of upper-outer quadrant of right female breast: Secondary | ICD-10-CM | POA: Insufficient documentation

## 2019-04-28 MED ORDER — GOSERELIN ACETATE 3.6 MG ~~LOC~~ IMPL
3.6000 mg | DRUG_IMPLANT | Freq: Once | SUBCUTANEOUS | Status: AC
  Administered 2019-04-28: 3.6 mg via SUBCUTANEOUS

## 2019-04-28 MED ORDER — GOSERELIN ACETATE 3.6 MG ~~LOC~~ IMPL
DRUG_IMPLANT | SUBCUTANEOUS | Status: AC
  Filled 2019-04-28: qty 3.6

## 2019-04-28 NOTE — Patient Instructions (Signed)

## 2019-05-28 ENCOUNTER — Other Ambulatory Visit: Payer: Self-pay

## 2019-05-28 ENCOUNTER — Inpatient Hospital Stay: Payer: 59 | Attending: Hematology and Oncology

## 2019-05-28 VITALS — BP 142/73 | HR 60 | Temp 98.0°F | Resp 19

## 2019-05-28 DIAGNOSIS — Z170 Estrogen receptor positive status [ER+]: Secondary | ICD-10-CM | POA: Diagnosis not present

## 2019-05-28 DIAGNOSIS — Z5111 Encounter for antineoplastic chemotherapy: Secondary | ICD-10-CM | POA: Insufficient documentation

## 2019-05-28 DIAGNOSIS — C50411 Malignant neoplasm of upper-outer quadrant of right female breast: Secondary | ICD-10-CM | POA: Insufficient documentation

## 2019-05-28 DIAGNOSIS — D509 Iron deficiency anemia, unspecified: Secondary | ICD-10-CM

## 2019-05-28 MED ORDER — GOSERELIN ACETATE 3.6 MG ~~LOC~~ IMPL
3.6000 mg | DRUG_IMPLANT | Freq: Once | SUBCUTANEOUS | Status: AC
  Administered 2019-05-28: 3.6 mg via SUBCUTANEOUS

## 2019-05-28 MED ORDER — GOSERELIN ACETATE 3.6 MG ~~LOC~~ IMPL
DRUG_IMPLANT | SUBCUTANEOUS | Status: AC
  Filled 2019-05-28: qty 3.6

## 2019-05-28 NOTE — Patient Instructions (Signed)

## 2019-06-25 ENCOUNTER — Other Ambulatory Visit: Payer: Self-pay | Admitting: *Deleted

## 2019-06-25 DIAGNOSIS — D509 Iron deficiency anemia, unspecified: Secondary | ICD-10-CM

## 2019-06-27 ENCOUNTER — Other Ambulatory Visit: Payer: Self-pay

## 2019-06-27 ENCOUNTER — Inpatient Hospital Stay: Payer: 59 | Attending: Hematology and Oncology

## 2019-06-27 ENCOUNTER — Inpatient Hospital Stay: Payer: 59

## 2019-06-27 VITALS — BP 153/72 | HR 58 | Temp 98.3°F | Resp 20

## 2019-06-27 DIAGNOSIS — Z5111 Encounter for antineoplastic chemotherapy: Secondary | ICD-10-CM | POA: Diagnosis not present

## 2019-06-27 DIAGNOSIS — C50411 Malignant neoplasm of upper-outer quadrant of right female breast: Secondary | ICD-10-CM | POA: Diagnosis present

## 2019-06-27 DIAGNOSIS — D509 Iron deficiency anemia, unspecified: Secondary | ICD-10-CM

## 2019-06-27 LAB — CMP (CANCER CENTER ONLY)
ALT: 23 U/L (ref 0–44)
AST: 25 U/L (ref 15–41)
Albumin: 4 g/dL (ref 3.5–5.0)
Alkaline Phosphatase: 86 U/L (ref 38–126)
Anion gap: 14 (ref 5–15)
BUN: 23 mg/dL — ABNORMAL HIGH (ref 6–20)
CO2: 26 mmol/L (ref 22–32)
Calcium: 9.4 mg/dL (ref 8.9–10.3)
Chloride: 102 mmol/L (ref 98–111)
Creatinine: 1.23 mg/dL — ABNORMAL HIGH (ref 0.44–1.00)
GFR, Est AFR Am: 58 mL/min — ABNORMAL LOW (ref >60)
GFR, Estimated: 50 mL/min — ABNORMAL LOW (ref >60)
Glucose, Bld: 126 mg/dL — ABNORMAL HIGH (ref 70–99)
Potassium: 3.6 mmol/L (ref 3.5–5.1)
Sodium: 142 mmol/L (ref 135–145)
Total Bilirubin: 0.4 mg/dL (ref 0.3–1.2)
Total Protein: 8 g/dL (ref 6.5–8.1)

## 2019-06-27 LAB — CBC WITH DIFFERENTIAL (CANCER CENTER ONLY)
Abs Immature Granulocytes: 0.02 10*3/uL (ref 0.00–0.07)
Basophils Absolute: 0 10*3/uL (ref 0.0–0.1)
Basophils Relative: 1 %
Eosinophils Absolute: 0.1 10*3/uL (ref 0.0–0.5)
Eosinophils Relative: 1 %
HCT: 38.1 % (ref 36.0–46.0)
Hemoglobin: 12.4 g/dL (ref 12.0–15.0)
Immature Granulocytes: 0 %
Lymphocytes Relative: 28 %
Lymphs Abs: 2.2 10*3/uL (ref 0.7–4.0)
MCH: 30.4 pg (ref 26.0–34.0)
MCHC: 32.5 g/dL (ref 30.0–36.0)
MCV: 93.4 fL (ref 80.0–100.0)
Monocytes Absolute: 0.5 10*3/uL (ref 0.1–1.0)
Monocytes Relative: 7 %
Neutro Abs: 5.1 10*3/uL (ref 1.7–7.7)
Neutrophils Relative %: 63 %
Platelet Count: 262 10*3/uL (ref 150–400)
RBC: 4.08 MIL/uL (ref 3.87–5.11)
RDW: 13 % (ref 11.5–15.5)
WBC Count: 7.9 10*3/uL (ref 4.0–10.5)
nRBC: 0 % (ref 0.0–0.2)

## 2019-06-27 MED ORDER — GOSERELIN ACETATE 3.6 MG ~~LOC~~ IMPL
3.6000 mg | DRUG_IMPLANT | Freq: Once | SUBCUTANEOUS | Status: AC
  Administered 2019-06-27: 3.6 mg via SUBCUTANEOUS

## 2019-06-27 MED ORDER — GOSERELIN ACETATE 3.6 MG ~~LOC~~ IMPL
DRUG_IMPLANT | SUBCUTANEOUS | Status: AC
  Filled 2019-06-27: qty 3.6

## 2019-06-27 NOTE — Patient Instructions (Signed)

## 2019-06-30 LAB — FERRITIN: Ferritin: 55 ng/mL (ref 11–307)

## 2019-06-30 LAB — IRON AND TIBC
Iron: 71 ug/dL (ref 41–142)
Saturation Ratios: 20 % — ABNORMAL LOW (ref 21–57)
TIBC: 365 ug/dL (ref 236–444)
UIBC: 294 ug/dL (ref 120–384)

## 2019-07-04 ENCOUNTER — Other Ambulatory Visit: Payer: Self-pay

## 2019-07-04 ENCOUNTER — Ambulatory Visit
Admission: RE | Admit: 2019-07-04 | Discharge: 2019-07-04 | Disposition: A | Discharge status: Home / Self Care | Payer: 59 | Source: Ambulatory Visit | Attending: Hematology and Oncology | Admitting: Hematology and Oncology

## 2019-07-04 DIAGNOSIS — Z1231 Encounter for screening mammogram for malignant neoplasm of breast: Secondary | ICD-10-CM

## 2019-07-04 MED ORDER — GADOBUTROL 1 MMOL/ML IV SOLN
10.0000 mL | Freq: Once | INTRAVENOUS | Status: AC | PRN
  Administered 2019-07-04: 10 mL via INTRAVENOUS

## 2019-07-28 ENCOUNTER — Inpatient Hospital Stay: Payer: 59 | Attending: Hematology and Oncology

## 2019-07-28 ENCOUNTER — Other Ambulatory Visit: Payer: Self-pay

## 2019-07-28 VITALS — BP 143/67 | HR 57 | Temp 98.2°F | Resp 18

## 2019-07-28 DIAGNOSIS — D509 Iron deficiency anemia, unspecified: Secondary | ICD-10-CM

## 2019-07-28 DIAGNOSIS — C50411 Malignant neoplasm of upper-outer quadrant of right female breast: Secondary | ICD-10-CM | POA: Diagnosis present

## 2019-07-28 DIAGNOSIS — Z5111 Encounter for antineoplastic chemotherapy: Secondary | ICD-10-CM | POA: Insufficient documentation

## 2019-07-28 MED ORDER — GOSERELIN ACETATE 3.6 MG ~~LOC~~ IMPL
3.6000 mg | DRUG_IMPLANT | Freq: Once | SUBCUTANEOUS | Status: AC
  Administered 2019-07-28: 3.6 mg via SUBCUTANEOUS
  Filled 2019-07-28: qty 3.6

## 2019-07-28 NOTE — Patient Instructions (Signed)

## 2019-08-28 ENCOUNTER — Inpatient Hospital Stay: Payer: 59 | Attending: Hematology and Oncology

## 2019-08-28 ENCOUNTER — Other Ambulatory Visit: Payer: Self-pay

## 2019-08-28 VITALS — BP 148/68 | HR 62 | Temp 98.6°F | Resp 18

## 2019-08-28 DIAGNOSIS — C50411 Malignant neoplasm of upper-outer quadrant of right female breast: Secondary | ICD-10-CM | POA: Insufficient documentation

## 2019-08-28 DIAGNOSIS — Z5111 Encounter for antineoplastic chemotherapy: Secondary | ICD-10-CM | POA: Diagnosis present

## 2019-08-28 DIAGNOSIS — D509 Iron deficiency anemia, unspecified: Secondary | ICD-10-CM

## 2019-08-28 MED ORDER — GOSERELIN ACETATE 3.6 MG ~~LOC~~ IMPL
3.6000 mg | DRUG_IMPLANT | Freq: Once | SUBCUTANEOUS | Status: AC
  Administered 2019-08-28: 16:00:00 3.6 mg via SUBCUTANEOUS

## 2019-08-28 NOTE — Patient Instructions (Signed)

## 2019-08-30 ENCOUNTER — Other Ambulatory Visit: Payer: Self-pay | Admitting: Hematology and Oncology

## 2019-09-15 IMAGING — MG DIGITAL DIAGNOSTIC BILATERAL MAMMOGRAM WITH TOMO AND CAD
5 of 8 series · 5 of 24 positions shown · non-contrast
Comparison: Previous exams as patient's most current imaging
includes a right sided mammogram and breast MRI 0858.

CLINICAL DATA: Patient presents for bilateral diagnostic
examination. Patient has a history invasive lobular carcinoma upper
outer right breast diagnosed 6285. Patient declined surgical
treatment and has been managed on anastrozole and Zoladex.

EXAM:
DIGITAL DIAGNOSTIC bilateral MAMMOGRAM WITH CAD AND TOMO
ULTRASOUND right BREAST

[L CC synth-2D]
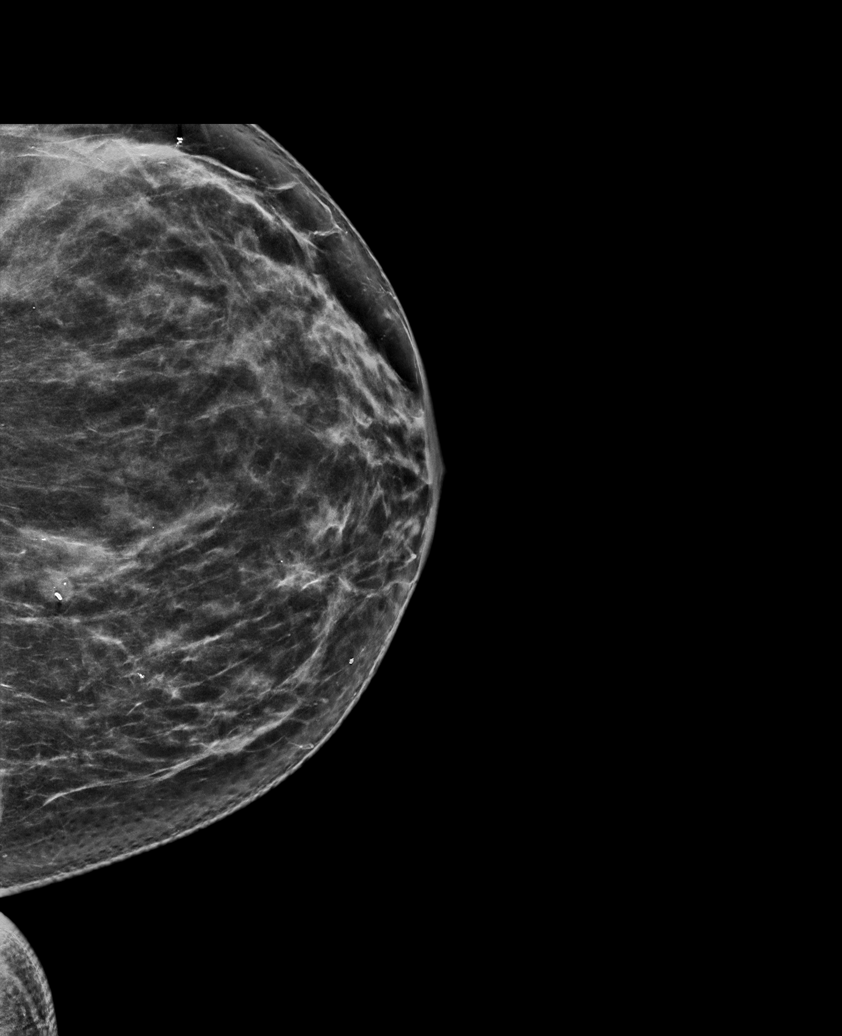

[R MLO synth-2D]
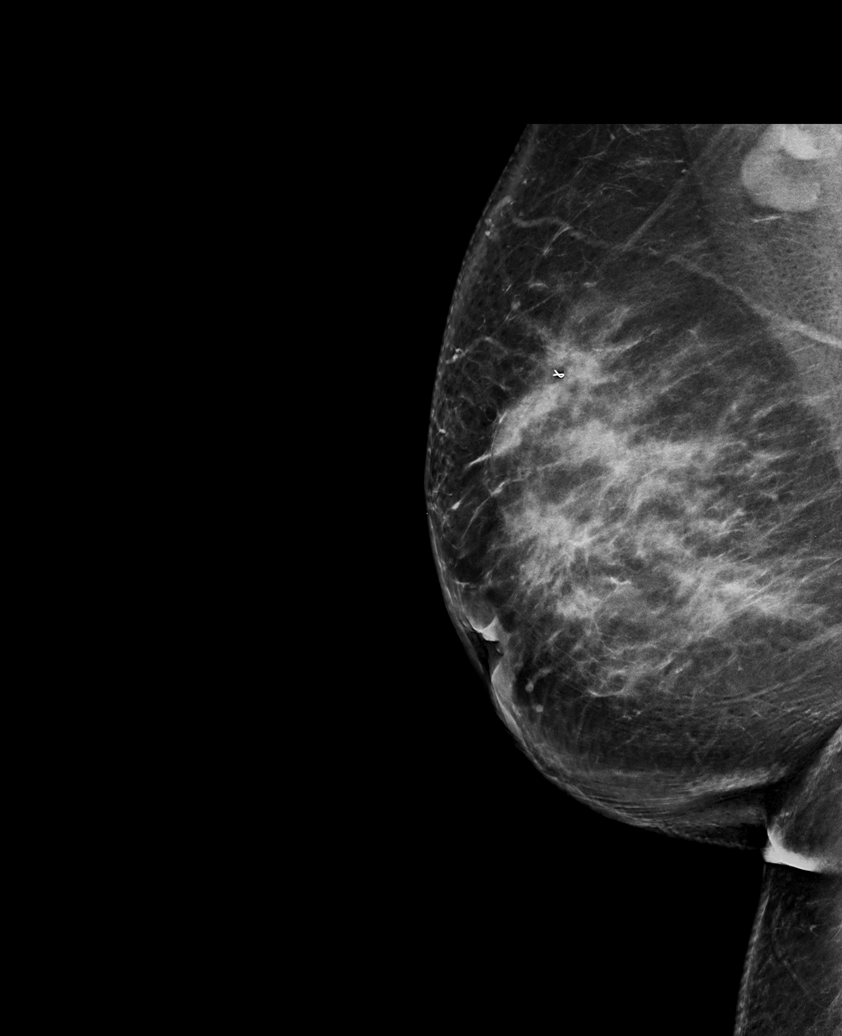

[R CC synth-2D]
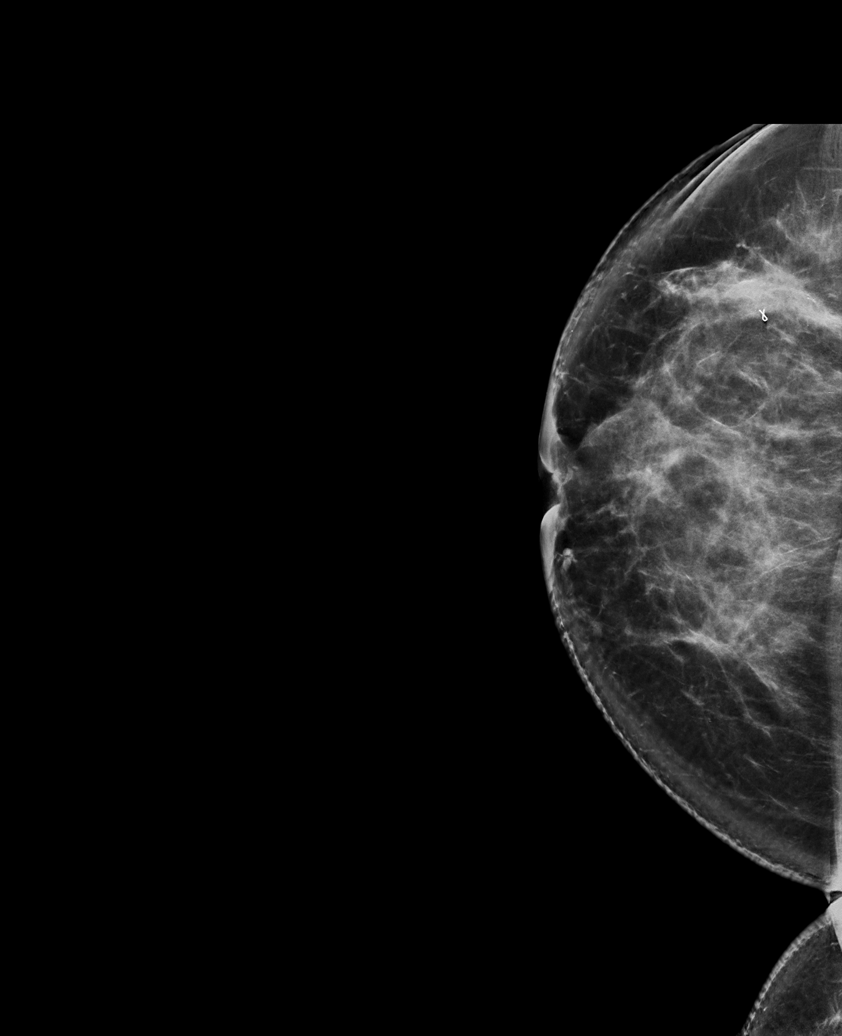

[L MLO synth-2D]
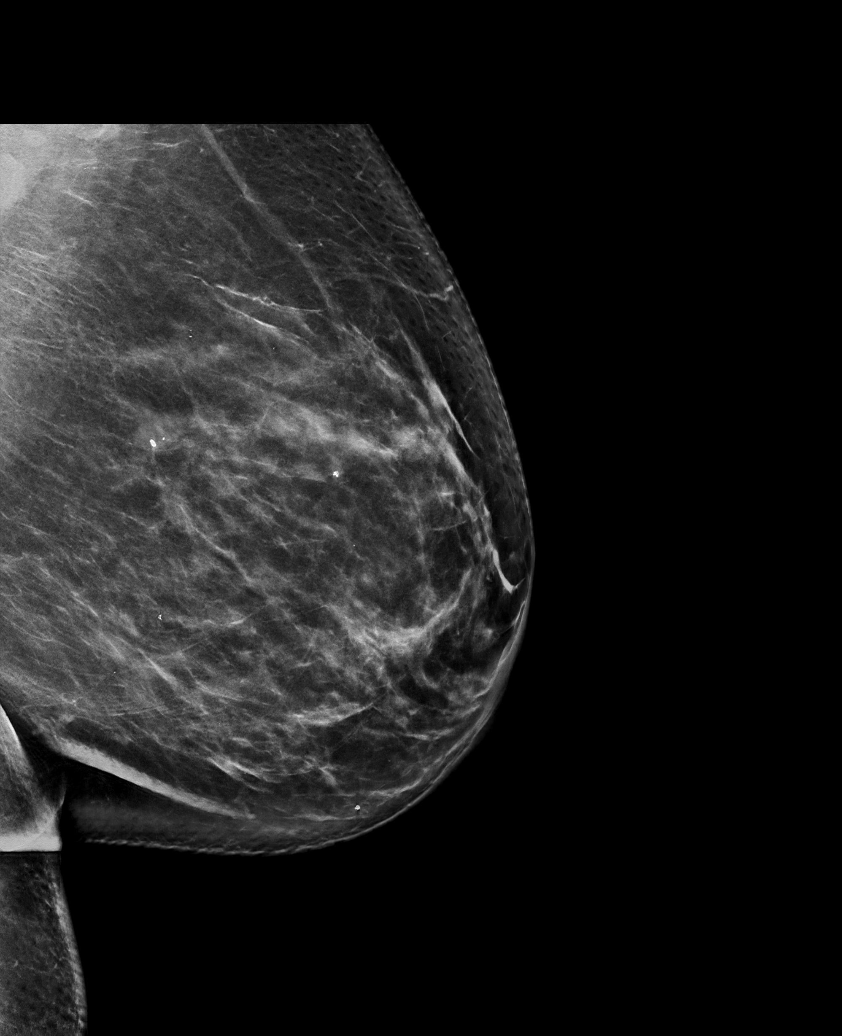

[R CC tomo · tomo slice 49/97.0]
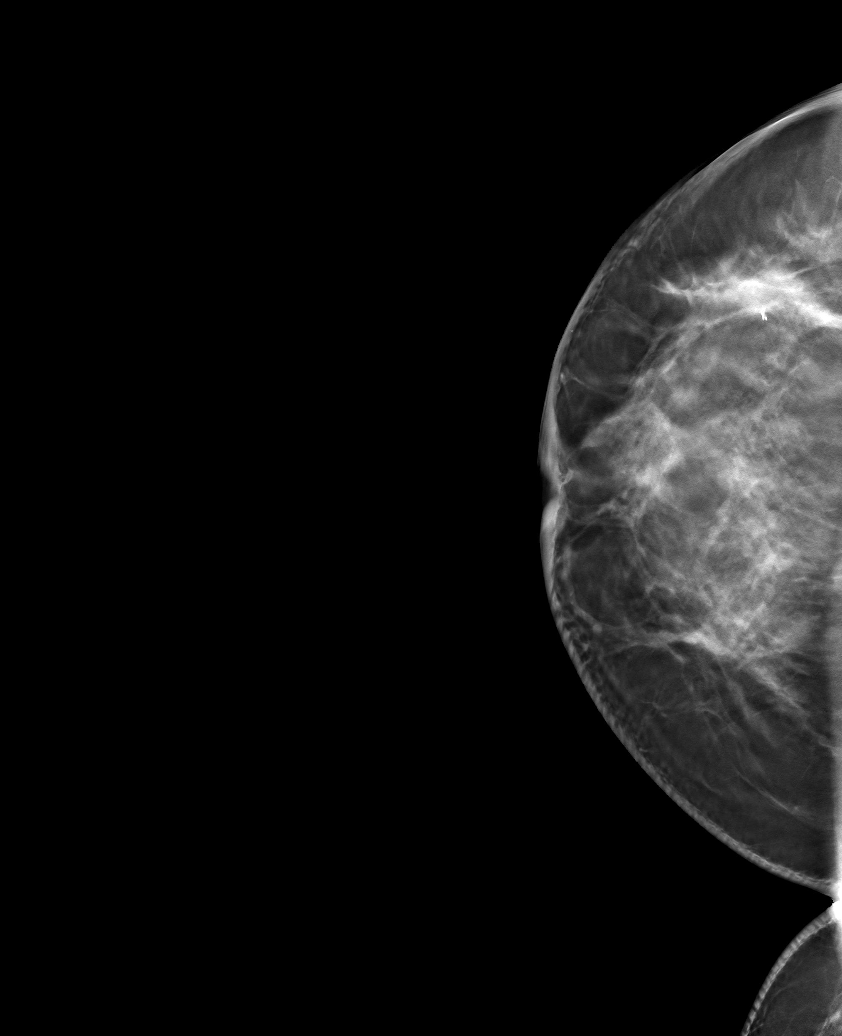

[5 of 24 positions shown; findings below may reference images not displayed]

ACR Breast Density Category c: The breast tissue is heterogeneously
dense, which may obscure small masses.
FINDINGS: Examination demonstrates ribbon shaped metallic clip over patient's
biopsy-proven invasive lobular carcinoma in the upper-outer right
breast. Interval worsening of the cancer site with increased density
and irregularity. There has been interval development of multiple
areas of increased density with mild distortion throughout most of
the upper half of the right breast. There is new right nipple
retraction. Lower right axillary lymph node with suggestion of
cortical thickening mammographically.

Subcentimeter degenerating fibroadenoma over the posterior third of
the inner upper left breast. Left breast is otherwise unchanged.

Mammographic images were processed with CAD.

Targeted ultrasound is performed, showing in indistinct hypoechoic
area with shadowing over the 10 o'clock position of the right breast
9 cm from the nipple likely representing patient's original cancer
site measuring 1.7 x 1.7 x 1.8 cm. There is an adjacent indistinct
hypoechoic area with shadowing possible the 10 o'clock position 7 cm
from the nipple measuring approximately 2.2 cm in diameter. There is
patchy indistinct areas of decreased echogenicity with shadowing
from the 11 to 12 o'clock position of the right breast 4 cm from the
nipple measuring approximately 2 x 2 x 4.2 cm altogether. There is
moderate indistinct hypoechoic area with shadowing in the 11 to 12
o'clock position of the retroareolar region. There is abnormal hazy
indistinctness to the fibroglandular tissue throughout the upper
inner quadrant of the right breast without focal mass.

Ultrasound of the right axilla demonstrates several normal appearing
axillary lymph nodes with 1 node having borderline cortical
thickness measuring 3.8 mm.
IMPRESSION: 1. Interval worsening of known invasive lobular carcinoma over the
upper-outer quadrant of the right breast. There has also been
interval development of moderate patchy density with distortion
throughout the upper half of the right breast with nipple
retraction. Findings are likely due to progression of patient's
known malignancy.

2.  Stable left breast.

RECOMMENDATION:
Recommend continued management as per clinical treatment plan. Right
breast biopsy could be performed via ultrasound guidance if desired
to confirm progression of known malignancy.

I have discussed the findings and recommendations with the patient.
Results were also provided in writing at the conclusion of the
visit. If applicable, a reminder letter will be sent to the patient
regarding the next appointment.

BI-RADS CATEGORY  6: Known biopsy-proven malignancy.

## 2019-09-26 ENCOUNTER — Inpatient Hospital Stay: Payer: 59 | Attending: Hematology and Oncology

## 2019-09-26 ENCOUNTER — Other Ambulatory Visit: Payer: Self-pay

## 2019-09-26 VITALS — BP 154/78 | HR 60 | Temp 98.3°F | Resp 18

## 2019-09-26 DIAGNOSIS — C50411 Malignant neoplasm of upper-outer quadrant of right female breast: Secondary | ICD-10-CM | POA: Insufficient documentation

## 2019-09-26 DIAGNOSIS — D509 Iron deficiency anemia, unspecified: Secondary | ICD-10-CM

## 2019-09-26 DIAGNOSIS — Z5111 Encounter for antineoplastic chemotherapy: Secondary | ICD-10-CM | POA: Insufficient documentation

## 2019-09-26 MED ORDER — GOSERELIN ACETATE 3.6 MG ~~LOC~~ IMPL
3.6000 mg | DRUG_IMPLANT | Freq: Once | SUBCUTANEOUS | Status: AC
  Administered 2019-09-26: 3.6 mg via SUBCUTANEOUS

## 2019-09-26 MED ORDER — GOSERELIN ACETATE 3.6 MG ~~LOC~~ IMPL
DRUG_IMPLANT | SUBCUTANEOUS | Status: AC
  Filled 2019-09-26: qty 3.6

## 2019-10-24 ENCOUNTER — Other Ambulatory Visit: Payer: Self-pay

## 2019-10-24 ENCOUNTER — Inpatient Hospital Stay: Payer: 59 | Attending: Hematology and Oncology

## 2019-10-24 VITALS — BP 152/70 | HR 65 | Temp 97.8°F | Resp 20

## 2019-10-24 DIAGNOSIS — Z5111 Encounter for antineoplastic chemotherapy: Secondary | ICD-10-CM | POA: Insufficient documentation

## 2019-10-24 DIAGNOSIS — C50411 Malignant neoplasm of upper-outer quadrant of right female breast: Secondary | ICD-10-CM | POA: Insufficient documentation

## 2019-10-24 DIAGNOSIS — Z170 Estrogen receptor positive status [ER+]: Secondary | ICD-10-CM | POA: Diagnosis not present

## 2019-10-24 DIAGNOSIS — D509 Iron deficiency anemia, unspecified: Secondary | ICD-10-CM

## 2019-10-24 MED ORDER — GOSERELIN ACETATE 3.6 MG ~~LOC~~ IMPL
DRUG_IMPLANT | SUBCUTANEOUS | Status: AC
  Filled 2019-10-24: qty 3.6

## 2019-10-24 MED ORDER — GOSERELIN ACETATE 3.6 MG ~~LOC~~ IMPL
3.6000 mg | DRUG_IMPLANT | Freq: Once | SUBCUTANEOUS | Status: AC
  Administered 2019-10-24: 3.6 mg via SUBCUTANEOUS

## 2019-10-24 NOTE — Patient Instructions (Signed)

## 2019-11-24 ENCOUNTER — Other Ambulatory Visit: Payer: Self-pay | Admitting: Hematology and Oncology

## 2019-11-24 ENCOUNTER — Other Ambulatory Visit: Payer: Self-pay

## 2019-11-24 ENCOUNTER — Inpatient Hospital Stay: Payer: 59 | Attending: Hematology and Oncology

## 2019-11-24 VITALS — BP 152/72 | HR 68 | Temp 98.2°F | Resp 18

## 2019-11-24 DIAGNOSIS — D509 Iron deficiency anemia, unspecified: Secondary | ICD-10-CM

## 2019-11-24 DIAGNOSIS — Z5111 Encounter for antineoplastic chemotherapy: Secondary | ICD-10-CM | POA: Insufficient documentation

## 2019-11-24 DIAGNOSIS — C50411 Malignant neoplasm of upper-outer quadrant of right female breast: Secondary | ICD-10-CM | POA: Diagnosis present

## 2019-11-24 MED ORDER — GOSERELIN ACETATE 3.6 MG ~~LOC~~ IMPL
3.6000 mg | DRUG_IMPLANT | Freq: Once | SUBCUTANEOUS | Status: AC
  Administered 2019-11-24: 3.6 mg via SUBCUTANEOUS

## 2019-11-24 MED ORDER — GOSERELIN ACETATE 3.6 MG ~~LOC~~ IMPL
DRUG_IMPLANT | SUBCUTANEOUS | Status: AC
  Filled 2019-11-24: qty 3.6

## 2019-11-24 NOTE — Patient Instructions (Signed)

## 2019-12-11 ENCOUNTER — Ambulatory Visit: Payer: 59 | Attending: Family

## 2019-12-11 DIAGNOSIS — Z23 Encounter for immunization: Secondary | ICD-10-CM

## 2019-12-11 NOTE — Progress Notes (Signed)
   Covid-19 Vaccination Clinic  Name:  Jasmine Stafford    MRN: [id number] DOB: 05-Feb-1966  12/11/2019  Jasmine Stafford was observed post Covid-19 immunization for 15 minutes without incident. She was provided with Vaccine Information Sheet and instruction to access the V-Safe system.   Jasmine Stafford was instructed to call 911 with any severe reactions post vaccine: Marland Kitchen Difficulty breathing  . Swelling of face and throat  . A fast heartbeat  . A bad rash all over body  . Dizziness and weakness   Immunizations Administered    Name Date Dose VIS Date Route   Moderna COVID-19 Vaccine 12/11/2019  1:37 PM 0.5 mL 07/2019 Intramuscular   Manufacturer: Moderna   Lot: [id number]   [id number]

## 2019-12-24 NOTE — Progress Notes (Signed)
Patient Care Team: Plotnikov, Evie Lacks, MD as PCP - General  DIAGNOSIS:    ICD-10-CM   1. Malignant neoplasm of upper-outer quadrant of right breast in female, estrogen receptor positive ([place])  C50.411    Z17.0     SUMMARY OF ONCOLOGIC HISTORY: Oncology History  Cancer of upper-outer quadrant of female breast ([place])  08/09/2012 Initial Diagnosis   Right breast biopsy 10 o'clock position: Invasive lobular cancer, ALH, grade 1-2, ER 99%, PR 99%, Ki-67 13%, HER-2 negative ratio 1.3   09/26/2012 -  Anti-estrogen oral therapy   Zoladex with anastrozole prescribed at cancer treatment [facility] of [place] along with lots of herbs.  Annual mammograms and MRIs showed stable findings.  Patient never had surgery     CHIEF COMPLIANT: Follow-up of right breast cancer on Zoladex and anastrozole   INTERVAL HISTORY: Jasmine Stafford is a 54 y.o. with above-mentioned history of right breast cancer currently on treatment with Zoladex and anastrozole. Breast MRI on 07/04/19 showed significant decrease in the right breast mass, and decrease size with increased enhancement of the right breast, concerning for low-grade lobular neoplasia involving a larger portion of the breast. She presents to the clinic today for follow-up.   ALLERGIES:  is allergic to other; shellfish allergy; erythromycin; moxifloxacin; penicillins; and tequin [gatifloxacin].  MEDICATIONS:  Current Outpatient Medications  Medication Sig Dispense Refill  . anastrozole (ARIMIDEX) 1 MG tablet TAKE 1 TABLET BY MOUTH EVERY DAY 30 tablet 3  . Ascorbic Acid (VITAMIN C) 1000 MG tablet Take 3,000 mg by mouth daily.     . Calcium Carb-Cholecalciferol (CALCIUM 600 + D) 600-200 MG-UNIT TABS Take 1 tablet by mouth daily.    . Cholecalciferol (VITAMIN D3) 10000 UNITS capsule Take 10,000 Units by mouth daily.    . Coenzyme Q10 (COQ10) 400 MG CAPS Take 400 mg by mouth daily.    Marland Kitchen DANDELION PO Take 2 capsules by mouth daily.     . Digestive  Enzymes CAPS Take 2 capsules by mouth 3 (three) times daily with meals.    . ferrous gluconate (FERGON) 325 MG tablet Take 1 tablet (325 mg total) by mouth 3 (three) times daily with meals. 30 tablet 0  . GARLIC PO Take 3 capsules by mouth daily.    . hydrALAZINE (APRESOLINE) 100 MG tablet TAKE 1 TABLET BY MOUTH 3 TIMES DAILY. FOLLOW-UP APPT IS DUE MUST SEE PROVIDER FOR FUTURE REFILLS (Patient taking differently: Take 100 mg by mouth 3 (three) times daily. ) 270 tablet 3  . hydrochlorothiazide (HYDRODIURIL) 25 MG tablet TAKE 1 TABLET BY MOUTH EVERY DAY (Patient taking differently: Take 25 mg by mouth daily. ) 30 tablet 11  . ipratropium-albuterol (DUONEB) 0.5-2.5 (3) MG/3ML SOLN Take 3 mLs by nebulization 2 (two) times daily. (Patient taking differently: Take 3 mLs by nebulization daily as needed (wheezing). ) 360 mL 0  . labetalol (NORMODYNE) 300 MG tablet Take 2 tablets (600 mg total) by mouth 2 (two) times daily. 360 tablet 1  . Lactobacillus (PROBIOTIC ACIDOPHILUS PO) Take 2 tablets by mouth daily.    . Melatonin 10 MG TABS Take 20 mg by mouth at bedtime as needed (sleep).     . Multiple Vitamin (MULTIVITAMIN WITH MINERALS) TABS tablet Take 1 tablet by mouth at bedtime.    . Multiple Vitamins-Iron (CHLORELLA PO) Take 4 g by mouth daily.    . Turmeric POWD Take 7.5 mLs by mouth at bedtime.      No current facility-administered medications for this  visit.    PHYSICAL EXAMINATION: ECOG PERFORMANCE STATUS: 1 - Symptomatic but completely ambulatory  Vitals:   12/25/19 1455  BP: (!) 161/82  Pulse: (!) 57  Resp: 17  Temp: (!) 97.1 F (36.2 C)  SpO2: 100%   Filed Weights   12/25/19 1455  Weight: 286 lb (129.7 kg)    BREAST: No palpable masses or nodules in either right or left breasts. No palpable axillary supraclavicular or infraclavicular adenopathy no breast tenderness or nipple discharge. (exam performed in the presence of a chaperone)  LABORATORY DATA:  I have reviewed the data  as listed CMP Latest Ref Rng & Units 06/27/2019 12/25/2018 11/22/2018  Glucose 70 - 99 mg/dL 126(H) 96 110(H)  BUN 6 - 20 mg/dL 23(H) 16 11  Creatinine 0.44 - 1.00 mg/dL 1.23(H) 1.17(H) 0.99  Sodium 135 - 145 mmol/L 142 141 138  Potassium 3.5 - 5.1 mmol/L 3.6 3.1(L) 3.2(L)  Chloride 98 - 111 mmol/L 102 106 100  CO2 22 - 32 mmol/L '26 27 24  ' Calcium 8.9 - 10.3 mg/dL 9.4 8.8(L) 8.5(L)  Total Protein 6.5 - 8.1 g/dL 8.0 7.6 7.5  Total Bilirubin 0.3 - 1.2 mg/dL 0.4 0.5 0.7  Alkaline Phos 38 - 126 U/L 86 63 63  AST 15 - 41 U/L '25 24 19  ' ALT 0 - 44 U/L '23 23 18    ' Lab Results  Component Value Date   WBC 7.9 06/27/2019   HGB 12.4 06/27/2019   HCT 38.1 06/27/2019   MCV 93.4 06/27/2019   PLT 262 06/27/2019   NEUTROABS 5.1 06/27/2019    ASSESSMENT & PLAN:  Cancer of upper-outer quadrant of female breast ([place]) Right breast invasive lobular cancer grade 1-2, ER 99%, PR 99%, HER-2 negative Ratio 1.3, Ki-67 13%, initial MRI revealed 7.1 cm area of mass or non-mass enhancement. Current treatment: Zoladex(noncompliant, last given March 2019 but had not received many doses),anastrozole 1 mg daily Patient never had surgery because she does not wish to undergo mastectomy.  Breast MRI 07/04/2019: Significant decrease in the previous demonstrated non-mass enhancement UOQ right breast with the exception of 9 mmNME 8:30 position, interval decrease in background enhancement involving all 4 quadrants.  Left breast normal  Current Treatment: Zoladex withanastrozole started June 2020 (originally started in 2014) She continues with her herbal supplements and other medication   Breast MRI being planned for Dec 2021 RTC in 1 year    No orders of the defined types were placed in this encounter.  The patient has a good understanding of the overall plan. she agrees with it. she will call with any problems that may develop before the next visit here.  Total time spent: 30 mins including face to face time  and time spent for planning, charting and coordination of care  Nicholas Lose, MD 12/25/2019  I, Cloyde Reams Dorshimer, am acting as scribe for Dr. Nicholas Lose.  I have reviewed the above documentation for accuracy and completeness, and I agree with the above.

## 2019-12-25 ENCOUNTER — Inpatient Hospital Stay: Payer: 59

## 2019-12-25 ENCOUNTER — Other Ambulatory Visit: Payer: Self-pay

## 2019-12-25 ENCOUNTER — Inpatient Hospital Stay: Payer: 59 | Attending: Hematology and Oncology | Admitting: Hematology and Oncology

## 2019-12-25 DIAGNOSIS — C50411 Malignant neoplasm of upper-outer quadrant of right female breast: Secondary | ICD-10-CM | POA: Diagnosis present

## 2019-12-25 DIAGNOSIS — Z5111 Encounter for antineoplastic chemotherapy: Secondary | ICD-10-CM | POA: Insufficient documentation

## 2019-12-25 DIAGNOSIS — D509 Iron deficiency anemia, unspecified: Secondary | ICD-10-CM

## 2019-12-25 DIAGNOSIS — Z170 Estrogen receptor positive status [ER+]: Secondary | ICD-10-CM | POA: Diagnosis not present

## 2019-12-25 MED ORDER — GOSERELIN ACETATE 3.6 MG ~~LOC~~ IMPL
3.6000 mg | DRUG_IMPLANT | Freq: Once | SUBCUTANEOUS | Status: AC
  Administered 2019-12-25: 3.6 mg via SUBCUTANEOUS

## 2019-12-25 MED ORDER — GOSERELIN ACETATE 3.6 MG ~~LOC~~ IMPL
DRUG_IMPLANT | SUBCUTANEOUS | Status: AC
  Filled 2019-12-25: qty 3.6

## 2019-12-25 MED ORDER — ANASTROZOLE 1 MG PO TABS: 1.0000 mg | ORAL_TABLET | Freq: Every day | ORAL | 3 refills | Status: DC

## 2019-12-25 NOTE — Patient Instructions (Signed)

## 2019-12-25 NOTE — Assessment & Plan Note (Signed)
Right breast invasive lobular cancer grade 1-2, ER 99%, PR 99%, HER-2 negative Ratio 1.3, Ki-67 13%, initial MRI revealed 7.1 cm area of mass or non-mass enhancement. Current treatment: Zoladex(noncompliant, last given March 2019 but had not received many doses),anastrozole 1 mg daily Patient never had surgery because she does not wish to undergo mastectomy.  Breast MRI 07/04/2019: Significant decrease in the previous demonstrated non-mass enhancement UOQ right breast with the exception of 9 mmNME 8:30 position, interval decrease in background enhancement involving all 4 quadrants.  Left breast normal  Current Treatment: Zoladex withanastrozole started June 2020 She continues with her herbal supplements and other medication   Breast MRI being planned for Dec 2021 RTC in 1 year

## 2020-01-05 ENCOUNTER — Ambulatory Visit
Admission: RE | Admit: 2020-01-05 | Discharge: 2020-01-05 | Disposition: A | Discharge status: Home / Self Care | Payer: 59 | Source: Ambulatory Visit | Attending: Hematology and Oncology | Admitting: Hematology and Oncology

## 2020-01-05 ENCOUNTER — Other Ambulatory Visit: Payer: Self-pay | Admitting: Hematology and Oncology

## 2020-01-05 ENCOUNTER — Other Ambulatory Visit: Payer: Self-pay

## 2020-01-05 DIAGNOSIS — C50411 Malignant neoplasm of upper-outer quadrant of right female breast: Secondary | ICD-10-CM

## 2020-01-05 DIAGNOSIS — Z170 Estrogen receptor positive status [ER+]: Secondary | ICD-10-CM

## 2020-01-06 ENCOUNTER — Ambulatory Visit: Payer: 59 | Attending: Family

## 2020-01-06 DIAGNOSIS — Z23 Encounter for immunization: Secondary | ICD-10-CM

## 2020-01-06 NOTE — Progress Notes (Signed)
° °  Covid-19 Vaccination Clinic  Name:  Jameson Morrow    MRN: [id number] DOB: Mar 09, 1966  01/06/2020  Ms. Hallmon was observed post Covid-19 immunization for 15 minutes without incident. She was provided with Vaccine Information Sheet and instruction to access the V-Safe system.   Ms. Beckum was instructed to call 911 with any severe reactions post vaccine:  Difficulty breathing   Swelling of face and throat   A fast heartbeat   A bad rash all over body   Dizziness and weakness   Immunizations Administered    Name Date Dose VIS Date Route   Moderna COVID-19 Vaccine 01/06/2020  1:58 PM 0.5 mL 07/2019 Intramuscular   Manufacturer: Levan Hurst   Lot: [id number]   [place]: [id number]

## 2020-01-09 ENCOUNTER — Telehealth: Payer: Self-pay | Admitting: Internal Medicine

## 2020-01-09 NOTE — Telephone Encounter (Signed)
Can use ice or tylenol as needed until visit. If any SOB or problems breathing seek care at ER.

## 2020-01-09 NOTE — Telephone Encounter (Signed)
Please advise in PCP's absence. Thanks! 

## 2020-01-09 NOTE — Telephone Encounter (Signed)
    Patient calling to report tender to touch area near neck and collar bone  x 2 days States pain started after having Covid vaccine No other symptoms Appointment scheduled for 6/14 Call given to [facility] for immediate advice

## 2020-01-09 NOTE — Telephone Encounter (Signed)
Called pt- no answer/unable to leave vm.  

## 2020-01-12 ENCOUNTER — Ambulatory Visit (INDEPENDENT_AMBULATORY_CARE_PROVIDER_SITE_OTHER): Payer: 59 | Admitting: Internal Medicine

## 2020-01-12 ENCOUNTER — Encounter: Payer: Self-pay | Admitting: Internal Medicine

## 2020-01-12 ENCOUNTER — Ambulatory Visit (INDEPENDENT_AMBULATORY_CARE_PROVIDER_SITE_OTHER): Payer: 59

## 2020-01-12 ENCOUNTER — Other Ambulatory Visit: Payer: Self-pay

## 2020-01-12 VITALS — BP 190/110 | HR 57 | Temp 98.5°F | Ht 64.0 in | Wt 286.0 lb

## 2020-01-12 DIAGNOSIS — R590 Localized enlarged lymph nodes: Secondary | ICD-10-CM

## 2020-01-12 DIAGNOSIS — I10 Essential (primary) hypertension: Secondary | ICD-10-CM

## 2020-01-12 DIAGNOSIS — I517 Cardiomegaly: Secondary | ICD-10-CM

## 2020-01-12 LAB — CBC WITH DIFFERENTIAL/PLATELET
Basophils Absolute: 0 10*3/uL (ref 0.0–0.1)
Basophils Relative: 0.5 % (ref 0.0–3.0)
Eosinophils Absolute: 0.1 10*3/uL (ref 0.0–0.7)
Eosinophils Relative: 1.5 % (ref 0.0–5.0)
HCT: 36.5 % (ref 36.0–46.0)
Hemoglobin: 12.1 g/dL (ref 12.0–15.0)
Lymphocytes Relative: 20.7 % (ref 12.0–46.0)
Lymphs Abs: 1.8 10*3/uL (ref 0.7–4.0)
MCHC: 33.2 g/dL (ref 30.0–36.0)
MCV: 91.4 fl (ref 78.0–100.0)
Monocytes Absolute: 0.5 10*3/uL (ref 0.1–1.0)
Monocytes Relative: 5.5 % (ref 3.0–12.0)
Neutro Abs: 6.2 10*3/uL (ref 1.4–7.7)
Neutrophils Relative %: 71.8 % (ref 43.0–77.0)
Platelets: 237 10*3/uL (ref 150.0–400.0)
RBC: 4 Mil/uL (ref 3.87–5.11)
RDW: 13.6 % (ref 11.5–15.5)
WBC: 8.7 10*3/uL (ref 4.0–10.5)

## 2020-01-12 LAB — BASIC METABOLIC PANEL
BUN: 17 mg/dL (ref 6–23)
CO2: 30 mEq/L (ref 19–32)
Calcium: 9.6 mg/dL (ref 8.4–10.5)
Chloride: 104 mEq/L (ref 96–112)
Creatinine, Ser: 1.11 mg/dL (ref 0.40–1.20)
GFR: 62.26 mL/min (ref >60.00)
Glucose, Bld: 96 mg/dL (ref 70–99)
Potassium: 3.6 mEq/L (ref 3.5–5.1)
Sodium: 142 mEq/L (ref 135–145)

## 2020-01-12 LAB — HEPATIC FUNCTION PANEL
ALT: 25 U/L (ref 0–35)
AST: 30 U/L (ref 0–37)
Albumin: 4.1 g/dL (ref 3.5–5.2)
Alkaline Phosphatase: 81 U/L (ref 39–117)
Bilirubin, Direct: 0.1 mg/dL (ref 0.0–0.3)
Total Bilirubin: 0.4 mg/dL (ref 0.2–1.2)
Total Protein: 7.6 g/dL (ref 6.0–8.3)

## 2020-01-12 LAB — TSH: TSH: 1.44 u[IU]/mL (ref 0.35–4.50)

## 2020-01-12 MED ORDER — TRIAMTERENE-HCTZ 37.5-25 MG PO TABS
1.0000 | ORAL_TABLET | Freq: Every day | ORAL | 3 refills | Status: DC
Start: 2020-01-12 — End: 2020-01-12

## 2020-01-12 MED ORDER — TRIAMTERENE-HCTZ 37.5-25 MG PO TABS: 1.0000 | ORAL_TABLET | Freq: Every day | ORAL | 3 refills | Status: DC

## 2020-01-12 NOTE — Assessment & Plan Note (Addendum)
Worse D/c HCTZ Start Maxzide Labs

## 2020-01-12 NOTE — Addendum Note (Signed)
Addended by: Trenda Moots on: 4/99/6924 09:59 AM   Modules accepted: Orders

## 2020-01-12 NOTE — Progress Notes (Signed)
Subjective:  Patient ID: Jasmine Stafford, female    DOB: [birth year]  Age: 54 y.o. MRN: [id number]  CC: No chief complaint on file.   HPI Jasmine Stafford presents for a reaction after her 2nd [place] shot on Tue last wk - sore arm R, swollen glands in the R supraclav area. Better now. It was the size of a plum on Wed. On monthly Zoladex for breast cancer for 3 years  F/u HTN BP at home 143/78 or so  Outpatient Medications Prior to Visit  Medication Sig Dispense Refill  . anastrozole (ARIMIDEX) 1 MG tablet Take 1 tablet (1 mg total) by mouth daily. 90 tablet 3  . Ascorbic Acid (VITAMIN C) 1000 MG tablet Take 3,000 mg by mouth daily.     . Calcium Carb-Cholecalciferol (CALCIUM 600 + D) 600-200 MG-UNIT TABS Take 1 tablet by mouth daily.    . Cholecalciferol (VITAMIN D3) 10000 UNITS capsule Take 10,000 Units by mouth daily.    . Coenzyme Q10 (COQ10) 400 MG CAPS Take 400 mg by mouth daily.    Marland Kitchen DANDELION PO Take 2 capsules by mouth daily.     . Digestive Enzymes CAPS Take 2 capsules by mouth 3 (three) times daily with meals.    . ferrous gluconate (FERGON) 325 MG tablet Take 1 tablet (325 mg total) by mouth 3 (three) times daily with meals. 30 tablet 0  . GARLIC PO Take 3 capsules by mouth daily.    . hydrALAZINE (APRESOLINE) 100 MG tablet TAKE 1 TABLET BY MOUTH 3 TIMES DAILY. FOLLOW-UP APPT IS DUE MUST SEE PROVIDER FOR FUTURE REFILLS (Patient taking differently: Take 100 mg by mouth 3 (three) times daily. ) 270 tablet 3  . hydrochlorothiazide (HYDRODIURIL) 25 MG tablet TAKE 1 TABLET BY MOUTH EVERY DAY (Patient taking differently: Take 25 mg by mouth daily. ) 30 tablet 11  . ipratropium-albuterol (DUONEB) 0.5-2.5 (3) MG/3ML SOLN Take 3 mLs by nebulization 2 (two) times daily. (Patient taking differently: Take 3 mLs by nebulization daily as needed (wheezing). ) 360 mL 0  . labetalol (NORMODYNE) 300 MG tablet Take 2 tablets (600 mg total) by mouth 2 (two) times daily. 360  tablet 1  . Lactobacillus (PROBIOTIC ACIDOPHILUS PO) Take 2 tablets by mouth daily.    . Melatonin 10 MG TABS Take 20 mg by mouth at bedtime as needed (sleep).     . Multiple Vitamin (MULTIVITAMIN WITH MINERALS) TABS tablet Take 1 tablet by mouth at bedtime.    . Multiple Vitamins-Iron (CHLORELLA PO) Take 4 g by mouth daily.    . Turmeric POWD Take 7.5 mLs by mouth at bedtime.      No facility-administered medications prior to visit.    ROS: Review of Systems  Constitutional: Negative for activity change, appetite change, chills, fatigue, fever and unexpected weight change.  HENT: Negative for congestion, mouth sores, sinus pressure and sore throat.   Eyes: Negative for visual disturbance.  Respiratory: Negative for cough and chest tightness.   Gastrointestinal: Negative for abdominal pain and nausea.  Genitourinary: Negative for difficulty urinating, frequency and vaginal pain.  Musculoskeletal: Negative for back pain and gait problem.  Skin: Negative for pallor and rash.  Neurological: Negative for dizziness, tremors, weakness, numbness and headaches.  Hematological: Positive for adenopathy. Does not bruise/bleed easily.  Psychiatric/Behavioral: Negative for confusion and sleep disturbance.    Objective:  BP (!) 190/110 (BP Location: Left Arm, Patient Position: Sitting, Cuff Size: Large)   Pulse (!) 57  Temp 98.5 F (36.9 C) (Oral)   Ht 5\' 4"  (1.626 m)   Wt 286 lb (129.7 kg)   SpO2 96%   BMI 49.09 kg/m   BP Readings from Last 3 Encounters:  01/12/20 (!) 190/110  12/25/19 (!) 161/82  11/24/19 (!) 152/72    Wt Readings from Last 3 Encounters:  01/12/20 286 lb (129.7 kg)  12/25/19 286 lb (129.7 kg)  03/22/19 260 lb (117.9 kg)    Physical Exam Constitutional:      General: She is not in acute distress.    Appearance: She is well-developed. She is obese.  HENT:     Head: Normocephalic.     Right Ear: External ear normal.     Left Ear: External ear normal.      Nose: Nose normal.  Eyes:     General:        Right eye: No discharge.        Left eye: No discharge.     Conjunctiva/sclera: Conjunctivae normal.     Pupils: Pupils are equal, round, and reactive to light.  Neck:     Thyroid: No thyromegaly.     Vascular: No JVD.     Trachea: No tracheal deviation.  Cardiovascular:     Rate and Rhythm: Normal rate and regular rhythm.     Heart sounds: Normal heart sounds.  Pulmonary:     Effort: No respiratory distress.     Breath sounds: No stridor. No wheezing.  Abdominal:     General: Bowel sounds are normal. There is no distension.     Palpations: Abdomen is soft. There is no mass.     Tenderness: There is no abdominal tenderness. There is no guarding or rebound.  Musculoskeletal:        General: No tenderness.     Cervical back: Normal range of motion and neck supple.  Lymphadenopathy:     Cervical: No cervical adenopathy.  Skin:    Findings: No erythema or rash.  Neurological:     Cranial Nerves: No cranial nerve deficit.     Motor: No abnormal muscle tone.     Coordination: Coordination normal.     Deep Tendon Reflexes: Reflexes normal.  Psychiatric:        Behavior: Behavior normal.        Thought Content: Thought content normal.        Judgment: Judgment normal.   R supraclav LNs x2 2x1 and 1x1 cm, tender  Lab Results  Component Value Date   WBC 7.9 06/27/2019   HGB 12.4 06/27/2019   HCT 38.1 06/27/2019   PLT 262 06/27/2019   GLUCOSE 126 (H) 06/27/2019   CHOL 205 (H) 11/22/2018   TRIG 68 11/22/2018   HDL 55 11/22/2018   LDLCALC 136 (H) 11/22/2018   ALT 23 06/27/2019   AST 25 06/27/2019   NA 142 06/27/2019   K 3.6 06/27/2019   CL 102 06/27/2019   CREATININE 1.23 (H) 06/27/2019   BUN 23 (H) 06/27/2019   CO2 26 06/27/2019    US BREAST LTD UNI RIGHT INC AXILLA  Result Date: 01/05/2020 CLINICAL DATA:  Patient with history of invasive lobular carcinoma within the right breast diagnosed in 2014. Patient on Zoladex and  anastrozole. EXAM: DIGITAL DIAGNOSTIC BILATERAL MAMMOGRAM WITH CAD AND TOMO ULTRASOUND RIGHT BREAST COMPARISON:  Previous exam(s). ACR Breast Density Category c: The breast tissue is heterogeneously dense, which may obscure small masses. FINDINGS: When compared to the most recent prior mammogram there  is decreased density throughout the right breast. The biopsied mass within the upper-outer right breast is similar to mildly interval decreased in size. No additional new masses, calcifications or distortion identified within either breast. Mammographic images were processed with CAD. Targeted ultrasound is performed, showing slight interval decrease in size of irregular mass within the right breast 10 o'clock position 9 cm from nipple measuring 1.7 x 1.0 x 1.2 cm, previously 1.8 x 1.6 x 1.7 cm. Additionally, within the right breast 11-12 o'clock position there is decreased heterogeneous hypoechoic soft tissue with decreased posterior acoustic shadowing. Overall these findings are in keeping with prior MRI which showed improvement in the findings within the right breast. IMPRESSION: Interval decrease in size of biopsy-proven malignancy within the right breast and additional findings suggestive of malignancy throughout the right breast centrally. RECOMMENDATION: Treatment plan for known right breast malignancy. I have discussed the findings and recommendations with the patient. If applicable, a reminder letter will be sent to the patient regarding the next appointment. BI-RADS CATEGORY  6: Known biopsy-proven malignancy. Electronically Signed   By: Lovey Newcomer M.D.   On: 01/05/2020 16:28   MM DIAG BREAST TOMO BILATERAL  Result Date: 01/05/2020 CLINICAL DATA:  Patient with history of invasive lobular carcinoma within the right breast diagnosed in 2014. Patient on Zoladex and anastrozole. EXAM: DIGITAL DIAGNOSTIC BILATERAL MAMMOGRAM WITH CAD AND TOMO ULTRASOUND RIGHT BREAST COMPARISON:  Previous exam(s). ACR Breast  Density Category c: The breast tissue is heterogeneously dense, which may obscure small masses. FINDINGS: When compared to the most recent prior mammogram there is decreased density throughout the right breast. The biopsied mass within the upper-outer right breast is similar to mildly interval decreased in size. No additional new masses, calcifications or distortion identified within either breast. Mammographic images were processed with CAD. Targeted ultrasound is performed, showing slight interval decrease in size of irregular mass within the right breast 10 o'clock position 9 cm from nipple measuring 1.7 x 1.0 x 1.2 cm, previously 1.8 x 1.6 x 1.7 cm. Additionally, within the right breast 11-12 o'clock position there is decreased heterogeneous hypoechoic soft tissue with decreased posterior acoustic shadowing. Overall these findings are in keeping with prior MRI which showed improvement in the findings within the right breast. IMPRESSION: Interval decrease in size of biopsy-proven malignancy within the right breast and additional findings suggestive of malignancy throughout the right breast centrally. RECOMMENDATION: Treatment plan for known right breast malignancy. I have discussed the findings and recommendations with the patient. If applicable, a reminder letter will be sent to the patient regarding the next appointment. BI-RADS CATEGORY  6: Known biopsy-proven malignancy. Electronically Signed   By: Lovey Newcomer M.D.   On: 01/05/2020 16:28    Assessment & Plan:   There are no diagnoses linked to this encounter.   No orders of the defined types were placed in this encounter.    Follow-up: No follow-ups on file.  Walker Kehr, MD

## 2020-01-12 NOTE — Telephone Encounter (Addendum)
Patient was seen today for this. See this OV. Called pt again- unable to leave vm. Closing phone note.

## 2020-01-15 ENCOUNTER — Telehealth: Payer: Self-pay | Admitting: Hematology and Oncology

## 2020-01-15 NOTE — Telephone Encounter (Signed)
Scheduled per los, patient has been called and notified. 

## 2020-01-16 ENCOUNTER — Encounter: Payer: Self-pay | Admitting: Internal Medicine

## 2020-01-16 DIAGNOSIS — I517 Cardiomegaly: Secondary | ICD-10-CM | POA: Insufficient documentation

## 2020-01-16 NOTE — Assessment & Plan Note (Signed)
Noted on a recent CXR.  Per recent ECHO - The left ventricle has normal systolic function with an ejection  fraction of 60-65%. The cavity size was normal. There is moderate  concentric left ventricular hypertrophy. Left ventricular diastolic  Doppler parameters are indeterminate. No evidence  of left ventricular regional wall motion abnormalities.

## 2020-01-22 ENCOUNTER — Other Ambulatory Visit: Payer: Self-pay

## 2020-01-22 ENCOUNTER — Inpatient Hospital Stay: Payer: 59 | Attending: Hematology and Oncology

## 2020-01-22 ENCOUNTER — Other Ambulatory Visit: Payer: Self-pay | Admitting: Internal Medicine

## 2020-01-22 VITALS — BP 216/86 | HR 53 | Resp 18

## 2020-01-22 DIAGNOSIS — C50411 Malignant neoplasm of upper-outer quadrant of right female breast: Secondary | ICD-10-CM | POA: Diagnosis present

## 2020-01-22 DIAGNOSIS — Z5111 Encounter for antineoplastic chemotherapy: Secondary | ICD-10-CM | POA: Insufficient documentation

## 2020-01-22 DIAGNOSIS — D509 Iron deficiency anemia, unspecified: Secondary | ICD-10-CM

## 2020-01-22 MED ORDER — GOSERELIN ACETATE 3.6 MG ~~LOC~~ IMPL
3.6000 mg | DRUG_IMPLANT | Freq: Once | SUBCUTANEOUS | Status: AC
  Administered 2020-01-22: 3.6 mg via SUBCUTANEOUS

## 2020-01-22 NOTE — Patient Instructions (Signed)

## 2020-01-26 ENCOUNTER — Encounter: Payer: Self-pay | Admitting: *Deleted

## 2020-01-26 NOTE — Progress Notes (Signed)
Received FMLA paperwork fax for pt.  RN placed paperwork in appropriate FMLA folder to be handled by representative.

## 2020-02-19 ENCOUNTER — Inpatient Hospital Stay: Payer: 59 | Attending: Hematology and Oncology

## 2020-02-19 ENCOUNTER — Other Ambulatory Visit: Payer: Self-pay

## 2020-02-19 VITALS — BP 191/77 | HR 58

## 2020-02-19 DIAGNOSIS — Z5111 Encounter for antineoplastic chemotherapy: Secondary | ICD-10-CM | POA: Insufficient documentation

## 2020-02-19 DIAGNOSIS — C50411 Malignant neoplasm of upper-outer quadrant of right female breast: Secondary | ICD-10-CM | POA: Insufficient documentation

## 2020-02-19 DIAGNOSIS — D509 Iron deficiency anemia, unspecified: Secondary | ICD-10-CM

## 2020-02-19 MED ORDER — GOSERELIN ACETATE 3.6 MG ~~LOC~~ IMPL
DRUG_IMPLANT | SUBCUTANEOUS | Status: AC
  Filled 2020-02-19: qty 3.6

## 2020-02-19 MED ORDER — GOSERELIN ACETATE 3.6 MG ~~LOC~~ IMPL
3.6000 mg | DRUG_IMPLANT | Freq: Once | SUBCUTANEOUS | Status: AC
  Administered 2020-02-19: 3.6 mg via SUBCUTANEOUS

## 2020-02-19 NOTE — Patient Instructions (Signed)

## 2020-03-25 ENCOUNTER — Other Ambulatory Visit: Payer: Self-pay

## 2020-03-25 ENCOUNTER — Inpatient Hospital Stay: Payer: 59 | Attending: Hematology and Oncology

## 2020-03-25 VITALS — BP 145/71 | HR 58 | Temp 99.0°F | Resp 18

## 2020-03-25 DIAGNOSIS — D509 Iron deficiency anemia, unspecified: Secondary | ICD-10-CM

## 2020-03-25 DIAGNOSIS — Z5111 Encounter for antineoplastic chemotherapy: Secondary | ICD-10-CM | POA: Diagnosis not present

## 2020-03-25 DIAGNOSIS — C50411 Malignant neoplasm of upper-outer quadrant of right female breast: Secondary | ICD-10-CM | POA: Insufficient documentation

## 2020-03-25 MED ORDER — GOSERELIN ACETATE 3.6 MG ~~LOC~~ IMPL
3.6000 mg | DRUG_IMPLANT | Freq: Once | SUBCUTANEOUS | Status: AC
  Administered 2020-03-25: 3.6 mg via SUBCUTANEOUS

## 2020-03-25 MED ORDER — GOSERELIN ACETATE 3.6 MG ~~LOC~~ IMPL
DRUG_IMPLANT | SUBCUTANEOUS | Status: AC
  Filled 2020-03-25: qty 3.6

## 2020-03-25 NOTE — Patient Instructions (Signed)

## 2020-04-22 ENCOUNTER — Inpatient Hospital Stay: Payer: 59 | Attending: Hematology and Oncology

## 2020-04-22 ENCOUNTER — Other Ambulatory Visit: Payer: Self-pay

## 2020-04-22 VITALS — BP 163/83 | HR 56 | Temp 98.8°F | Resp 18

## 2020-04-22 DIAGNOSIS — C50411 Malignant neoplasm of upper-outer quadrant of right female breast: Secondary | ICD-10-CM | POA: Diagnosis present

## 2020-04-22 DIAGNOSIS — Z5111 Encounter for antineoplastic chemotherapy: Secondary | ICD-10-CM | POA: Insufficient documentation

## 2020-04-22 DIAGNOSIS — D509 Iron deficiency anemia, unspecified: Secondary | ICD-10-CM

## 2020-04-22 MED ORDER — GOSERELIN ACETATE 3.6 MG ~~LOC~~ IMPL
DRUG_IMPLANT | SUBCUTANEOUS | Status: AC
  Filled 2020-04-22: qty 3.6

## 2020-04-22 MED ORDER — GOSERELIN ACETATE 3.6 MG ~~LOC~~ IMPL
3.6000 mg | DRUG_IMPLANT | Freq: Once | SUBCUTANEOUS | Status: AC
  Administered 2020-04-22: 3.6 mg via SUBCUTANEOUS

## 2020-04-22 NOTE — Patient Instructions (Signed)

## 2020-05-27 ENCOUNTER — Inpatient Hospital Stay: Payer: 59 | Attending: Hematology and Oncology

## 2020-05-27 ENCOUNTER — Other Ambulatory Visit: Payer: Self-pay

## 2020-05-27 VITALS — BP 160/79 | HR 63 | Resp 18

## 2020-05-27 DIAGNOSIS — C50411 Malignant neoplasm of upper-outer quadrant of right female breast: Secondary | ICD-10-CM | POA: Insufficient documentation

## 2020-05-27 DIAGNOSIS — Z5111 Encounter for antineoplastic chemotherapy: Secondary | ICD-10-CM | POA: Diagnosis not present

## 2020-05-27 DIAGNOSIS — D509 Iron deficiency anemia, unspecified: Secondary | ICD-10-CM

## 2020-05-27 MED ORDER — GOSERELIN ACETATE 3.6 MG ~~LOC~~ IMPL
3.6000 mg | DRUG_IMPLANT | Freq: Once | SUBCUTANEOUS | Status: AC
  Administered 2020-05-27: 3.6 mg via SUBCUTANEOUS

## 2020-05-27 MED ORDER — GOSERELIN ACETATE 3.6 MG ~~LOC~~ IMPL
DRUG_IMPLANT | SUBCUTANEOUS | Status: AC
  Filled 2020-05-27: qty 3.6

## 2020-05-27 NOTE — Patient Instructions (Signed)

## 2020-06-06 ENCOUNTER — Other Ambulatory Visit: Payer: Self-pay

## 2020-06-06 ENCOUNTER — Encounter (HOSPITAL_COMMUNITY): Payer: Self-pay | Admitting: Emergency Medicine

## 2020-06-06 ENCOUNTER — Emergency Department (HOSPITAL_COMMUNITY)
Admission: EM | Admit: 2020-06-06 | Discharge: 2020-06-06 | Disposition: A | Discharge status: Home / Self Care | Payer: 59 | Attending: Emergency Medicine | Admitting: Emergency Medicine

## 2020-06-06 DIAGNOSIS — R202 Paresthesia of skin: Secondary | ICD-10-CM | POA: Insufficient documentation

## 2020-06-06 DIAGNOSIS — Z5321 Procedure and treatment not carried out due to patient leaving prior to being seen by health care provider: Secondary | ICD-10-CM | POA: Diagnosis not present

## 2020-06-06 NOTE — ED Notes (Signed)
Patient left.

## 2020-06-06 NOTE — ED Triage Notes (Addendum)
   Patient comes in with R sided numbness that started after she took her BP medication tonight before bed.  Patient states this has happened before with her BP medication and the dosage was changed.  NIH - 0.  No neuro deficits.  Patient states she was also exposed to shellfish and has an allergy.  Numbness has resolved but patient still has tingling in her R hand.  No pain at this time.  No confusion, headache, or syncope.  No SOB or respiratory distress.

## 2020-06-11 ENCOUNTER — Telehealth: Payer: Self-pay | Admitting: Hematology and Oncology

## 2020-06-11 NOTE — Telephone Encounter (Signed)
Scheduled per 11/12 staff msg. Called and spoke with pt, confirmed 11/26 appt

## 2020-06-17 ENCOUNTER — Ambulatory Visit: Payer: 59

## 2020-06-25 ENCOUNTER — Inpatient Hospital Stay: Payer: 59 | Attending: Hematology and Oncology

## 2020-06-25 ENCOUNTER — Other Ambulatory Visit: Payer: Self-pay

## 2020-06-25 VITALS — BP 196/77 | HR 57 | Resp 18

## 2020-06-25 DIAGNOSIS — D509 Iron deficiency anemia, unspecified: Secondary | ICD-10-CM

## 2020-06-25 DIAGNOSIS — Z5111 Encounter for antineoplastic chemotherapy: Secondary | ICD-10-CM | POA: Diagnosis not present

## 2020-06-25 DIAGNOSIS — C50411 Malignant neoplasm of upper-outer quadrant of right female breast: Secondary | ICD-10-CM | POA: Diagnosis present

## 2020-06-25 MED ORDER — GOSERELIN ACETATE 3.6 MG ~~LOC~~ IMPL
3.6000 mg | DRUG_IMPLANT | Freq: Once | SUBCUTANEOUS | Status: AC
  Administered 2020-06-25: 3.6 mg via SUBCUTANEOUS

## 2020-06-25 MED ORDER — GOSERELIN ACETATE 3.6 MG ~~LOC~~ IMPL
DRUG_IMPLANT | SUBCUTANEOUS | Status: AC
  Filled 2020-06-25: qty 3.6

## 2020-06-28 ENCOUNTER — Other Ambulatory Visit: Payer: Self-pay

## 2020-06-28 ENCOUNTER — Ambulatory Visit (HOSPITAL_COMMUNITY)
Admission: RE | Admit: 2020-06-28 | Discharge: 2020-06-28 | Disposition: A | Discharge status: Home / Self Care | Payer: 59 | Source: Ambulatory Visit | Attending: Hematology and Oncology | Admitting: Hematology and Oncology

## 2020-06-28 ENCOUNTER — Encounter (HOSPITAL_COMMUNITY): Payer: Self-pay

## 2020-06-28 DIAGNOSIS — Z170 Estrogen receptor positive status [ER+]: Secondary | ICD-10-CM

## 2020-06-28 DIAGNOSIS — C50411 Malignant neoplasm of upper-outer quadrant of right female breast: Secondary | ICD-10-CM

## 2020-06-29 ENCOUNTER — Other Ambulatory Visit: Payer: Self-pay | Admitting: *Deleted

## 2020-06-29 DIAGNOSIS — Z170 Estrogen receptor positive status [ER+]: Secondary | ICD-10-CM

## 2020-06-29 DIAGNOSIS — C50411 Malignant neoplasm of upper-outer quadrant of right female breast: Secondary | ICD-10-CM

## 2020-06-30 ENCOUNTER — Other Ambulatory Visit: Payer: Self-pay | Admitting: Internal Medicine

## 2020-07-09 ENCOUNTER — Other Ambulatory Visit: Payer: Self-pay

## 2020-07-09 ENCOUNTER — Ambulatory Visit
Admission: RE | Admit: 2020-07-09 | Discharge: 2020-07-09 | Disposition: A | Discharge status: Home / Self Care | Payer: 59 | Source: Ambulatory Visit | Attending: Hematology and Oncology | Admitting: Hematology and Oncology

## 2020-07-09 ENCOUNTER — Telehealth: Payer: Self-pay | Admitting: Hematology and Oncology

## 2020-07-09 DIAGNOSIS — C50411 Malignant neoplasm of upper-outer quadrant of right female breast: Secondary | ICD-10-CM

## 2020-07-09 MED ORDER — GADOBUTROL 1 MMOL/ML IV SOLN
9.0000 mL | Freq: Once | INTRAVENOUS | Status: AC | PRN
  Administered 2020-07-09: 9 mL via INTRAVENOUS

## 2020-07-09 NOTE — Telephone Encounter (Signed)
I informed her that the breast MRI showed continued improvement in the non-mass enhancement.  She was extremely happy to hear that.

## 2020-07-22 ENCOUNTER — Other Ambulatory Visit: Payer: Self-pay

## 2020-07-22 ENCOUNTER — Inpatient Hospital Stay: Payer: 59 | Attending: Hematology and Oncology

## 2020-07-22 VITALS — BP 154/71 | HR 58 | Resp 18

## 2020-07-22 DIAGNOSIS — D509 Iron deficiency anemia, unspecified: Secondary | ICD-10-CM

## 2020-07-22 DIAGNOSIS — C50411 Malignant neoplasm of upper-outer quadrant of right female breast: Secondary | ICD-10-CM | POA: Insufficient documentation

## 2020-07-22 DIAGNOSIS — Z5111 Encounter for antineoplastic chemotherapy: Secondary | ICD-10-CM | POA: Insufficient documentation

## 2020-07-22 MED ORDER — GOSERELIN ACETATE 3.6 MG ~~LOC~~ IMPL
DRUG_IMPLANT | SUBCUTANEOUS | Status: AC
  Filled 2020-07-22: qty 3.6

## 2020-07-22 MED ORDER — GOSERELIN ACETATE 3.6 MG ~~LOC~~ IMPL
3.6000 mg | DRUG_IMPLANT | Freq: Once | SUBCUTANEOUS | Status: AC
  Administered 2020-07-22: 3.6 mg via SUBCUTANEOUS

## 2020-07-22 NOTE — Patient Instructions (Signed)

## 2020-08-12 ENCOUNTER — Ambulatory Visit: Payer: 59 | Attending: Internal Medicine

## 2020-08-12 DIAGNOSIS — Z23 Encounter for immunization: Secondary | ICD-10-CM

## 2020-08-12 NOTE — Progress Notes (Signed)
   Covid-19 Vaccination Clinic  Name:  Jasmine Stafford    MRN: [id number] DOB: 1965/09/07  08/12/2020  [name] was observed post Covid-19 immunization for 15 minutes without incident. She was provided with Vaccine Information Sheet and instruction to access the V-Safe system.   [name] was instructed to call 911 with any severe reactions post vaccine: Marland Kitchen Difficulty breathing  . Swelling of face and throat  . A fast heartbeat  . A bad rash all over body  . Dizziness and weakness   Immunizations Administered    Name Date Dose VIS Date Route   Moderna Covid-19 Booster Vaccine 08/12/2020  2:04 PM 0.25 mL 05/19/2020 Intramuscular   Manufacturer: Moderna   Lot: [id number]   [place]: [id number]

## 2020-08-26 ENCOUNTER — Inpatient Hospital Stay: Payer: 59 | Attending: Hematology and Oncology

## 2020-08-26 ENCOUNTER — Other Ambulatory Visit: Payer: Self-pay

## 2020-08-26 VITALS — BP 160/82 | HR 63 | Resp 19

## 2020-08-26 DIAGNOSIS — C50411 Malignant neoplasm of upper-outer quadrant of right female breast: Secondary | ICD-10-CM | POA: Diagnosis present

## 2020-08-26 DIAGNOSIS — Z5111 Encounter for antineoplastic chemotherapy: Secondary | ICD-10-CM | POA: Insufficient documentation

## 2020-08-26 DIAGNOSIS — D509 Iron deficiency anemia, unspecified: Secondary | ICD-10-CM

## 2020-08-26 MED ORDER — GOSERELIN ACETATE 3.6 MG ~~LOC~~ IMPL
DRUG_IMPLANT | SUBCUTANEOUS | Status: AC
  Filled 2020-08-26: qty 3.6

## 2020-08-26 MED ORDER — GOSERELIN ACETATE 3.6 MG ~~LOC~~ IMPL
3.6000 mg | DRUG_IMPLANT | Freq: Once | SUBCUTANEOUS | Status: AC
  Administered 2020-08-26: 3.6 mg via SUBCUTANEOUS

## 2020-08-26 NOTE — Patient Instructions (Signed)

## 2020-09-23 ENCOUNTER — Inpatient Hospital Stay: Payer: 59 | Attending: Hematology and Oncology

## 2020-09-23 ENCOUNTER — Other Ambulatory Visit: Payer: Self-pay

## 2020-09-23 VITALS — BP 220/93 | HR 59

## 2020-09-23 DIAGNOSIS — D509 Iron deficiency anemia, unspecified: Secondary | ICD-10-CM

## 2020-09-23 DIAGNOSIS — Z5111 Encounter for antineoplastic chemotherapy: Secondary | ICD-10-CM | POA: Diagnosis not present

## 2020-09-23 DIAGNOSIS — C50411 Malignant neoplasm of upper-outer quadrant of right female breast: Secondary | ICD-10-CM | POA: Diagnosis present

## 2020-09-23 MED ORDER — GOSERELIN ACETATE 3.6 MG ~~LOC~~ IMPL
3.6000 mg | DRUG_IMPLANT | Freq: Once | SUBCUTANEOUS | Status: AC
  Administered 2020-09-23: 3.6 mg via SUBCUTANEOUS

## 2020-09-23 MED ORDER — GOSERELIN ACETATE 3.6 MG ~~LOC~~ IMPL
DRUG_IMPLANT | SUBCUTANEOUS | Status: AC
  Filled 2020-09-23: qty 3.6

## 2020-09-24 IMAGING — DX DG CHEST 2V
2 series · 2 of 2 positions shown · non-contrast
Comparison: PA and lateral chest 11/22/2018.

CLINICAL DATA: Right supraclavicular lymphadenopathy for 5 days.
History of breast cancer.

EXAM:
CHEST - 2 VIEW

[chest pa]
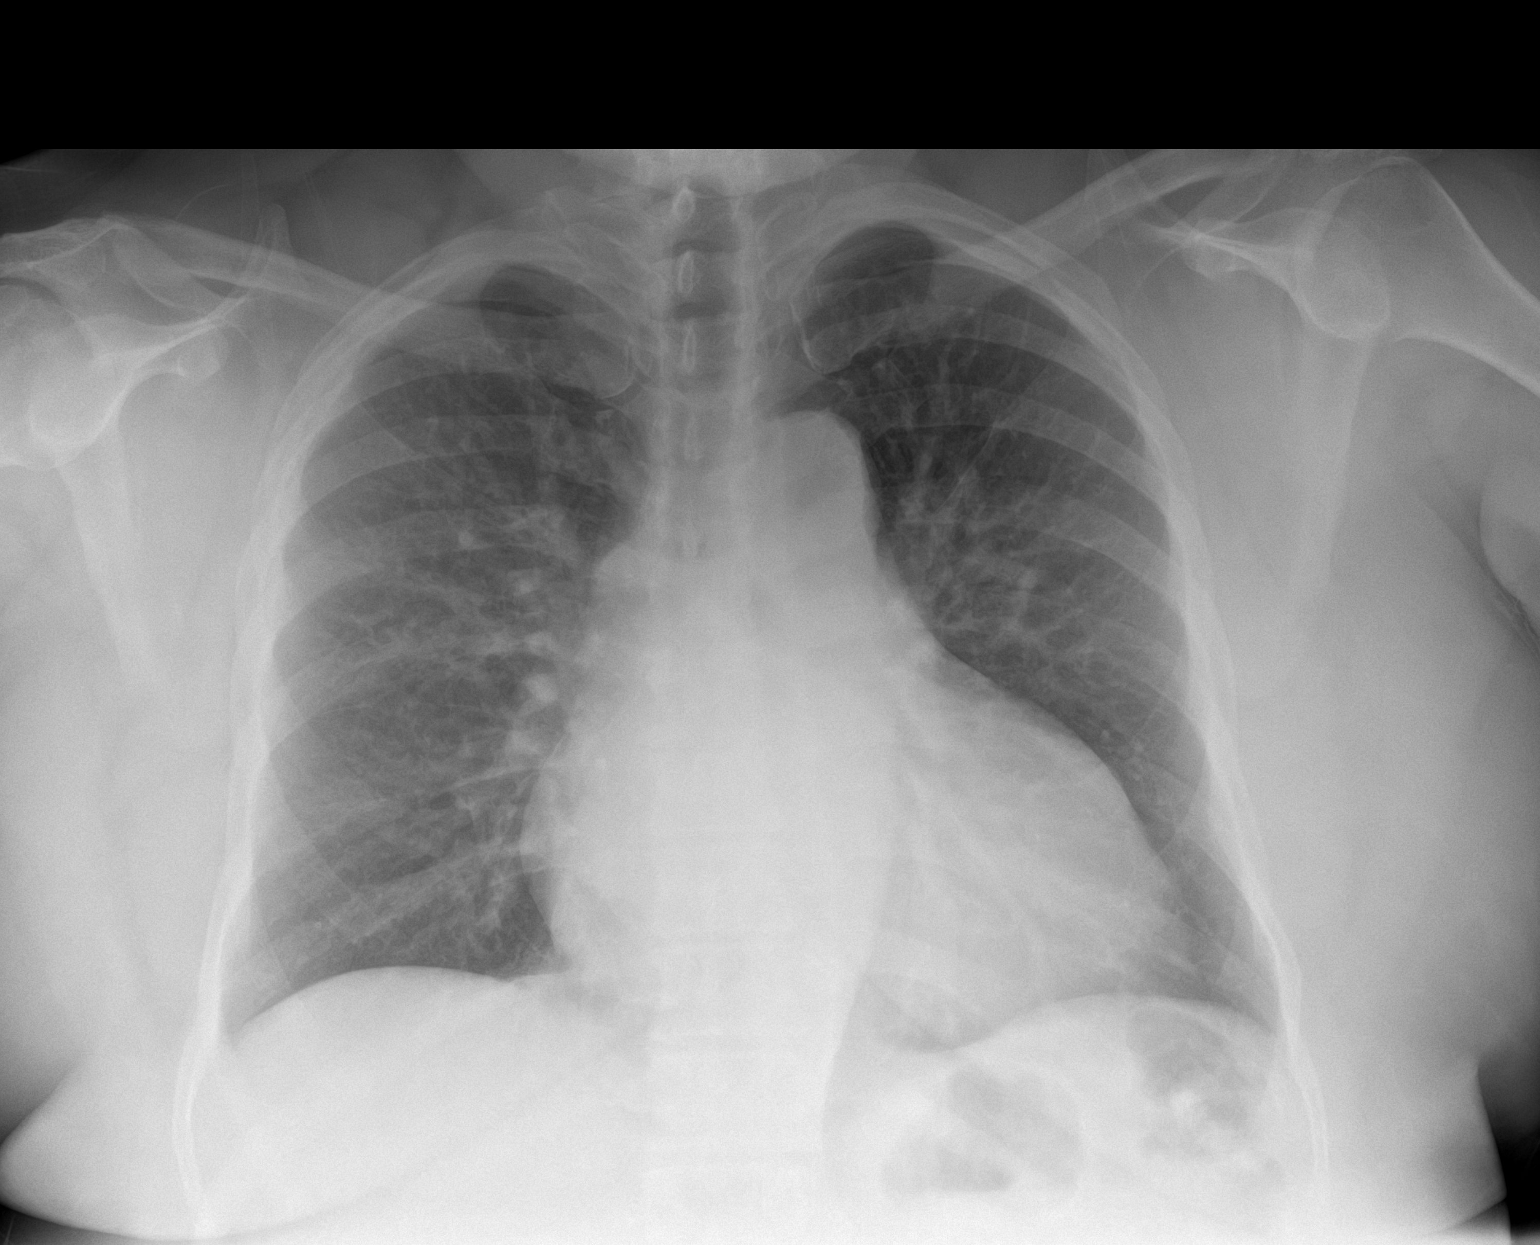

[chest lat]
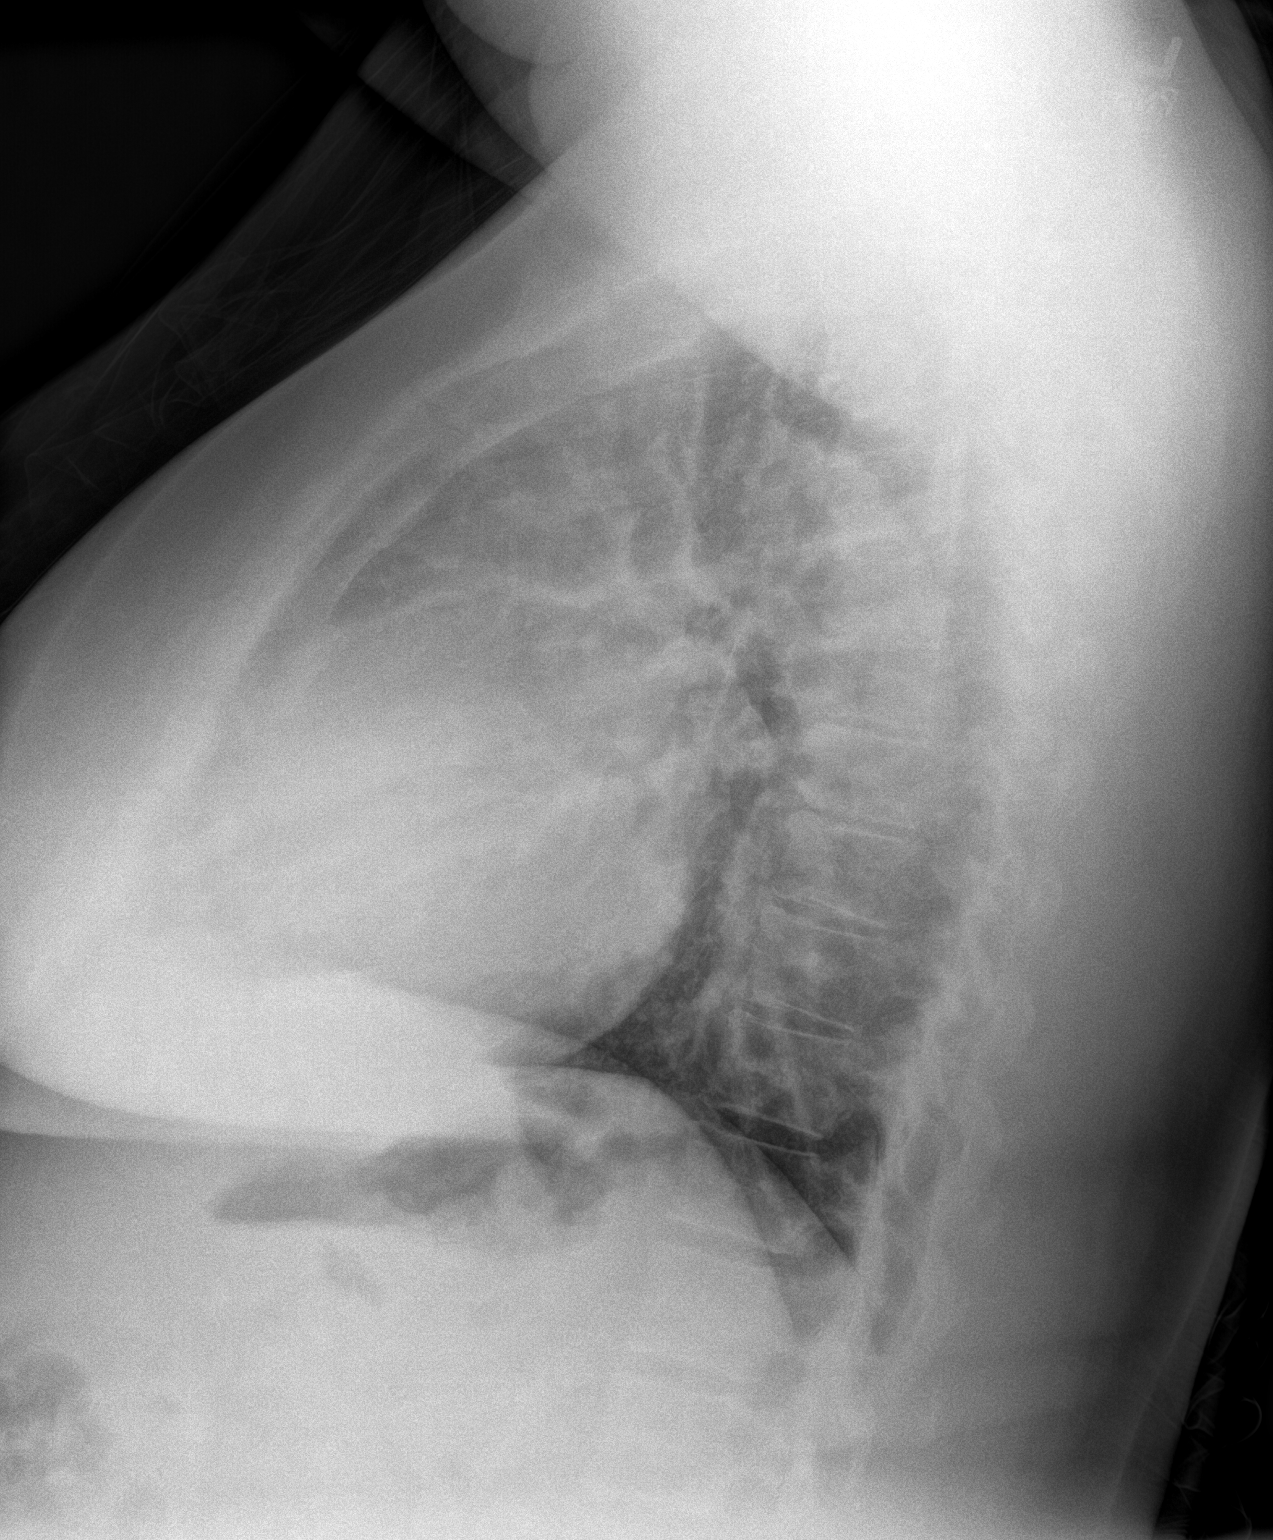

[2 of 2 positions shown; findings below may reference images not displayed]

FINDINGS: There is cardiomegaly and vascular congestion. No consolidative
process, pneumothorax or effusion. No evidence of lymphadenopathy by
plain films. No bony abnormality.
IMPRESSION: Cardiomegaly without acute disease.

## 2020-10-21 ENCOUNTER — Other Ambulatory Visit: Payer: Self-pay

## 2020-10-21 ENCOUNTER — Inpatient Hospital Stay: Payer: 59 | Attending: Hematology and Oncology

## 2020-10-21 VITALS — BP 170/100 | HR 57 | Temp 98.7°F | Resp 18

## 2020-10-21 DIAGNOSIS — D509 Iron deficiency anemia, unspecified: Secondary | ICD-10-CM

## 2020-10-21 DIAGNOSIS — Z5111 Encounter for antineoplastic chemotherapy: Secondary | ICD-10-CM | POA: Diagnosis not present

## 2020-10-21 DIAGNOSIS — C50411 Malignant neoplasm of upper-outer quadrant of right female breast: Secondary | ICD-10-CM | POA: Diagnosis present

## 2020-10-21 MED ORDER — GOSERELIN ACETATE 3.6 MG ~~LOC~~ IMPL
3.6000 mg | DRUG_IMPLANT | Freq: Once | SUBCUTANEOUS | Status: AC
  Administered 2020-10-21: 3.6 mg via SUBCUTANEOUS

## 2020-10-21 MED ORDER — GOSERELIN ACETATE 3.6 MG ~~LOC~~ IMPL
DRUG_IMPLANT | SUBCUTANEOUS | Status: AC
  Filled 2020-10-21: qty 3.6

## 2020-10-21 NOTE — Patient Instructions (Signed)

## 2020-11-24 ENCOUNTER — Telehealth: Payer: Self-pay | Admitting: Hematology and Oncology

## 2020-11-24 NOTE — Telephone Encounter (Signed)
R/s appt per 4/27 sch msg. Pt aware.  

## 2020-11-25 ENCOUNTER — Inpatient Hospital Stay: Payer: 59

## 2020-11-26 ENCOUNTER — Other Ambulatory Visit: Payer: Self-pay

## 2020-11-26 ENCOUNTER — Other Ambulatory Visit: Payer: Self-pay | Admitting: Adult Health

## 2020-11-26 ENCOUNTER — Inpatient Hospital Stay: Payer: 59 | Attending: Hematology and Oncology

## 2020-11-26 VITALS — BP 162/78 | HR 62 | Temp 98.2°F | Resp 18

## 2020-11-26 DIAGNOSIS — D509 Iron deficiency anemia, unspecified: Secondary | ICD-10-CM

## 2020-11-26 DIAGNOSIS — Z5111 Encounter for antineoplastic chemotherapy: Secondary | ICD-10-CM | POA: Diagnosis not present

## 2020-11-26 DIAGNOSIS — C50411 Malignant neoplasm of upper-outer quadrant of right female breast: Secondary | ICD-10-CM | POA: Insufficient documentation

## 2020-11-26 MED ORDER — GOSERELIN ACETATE 3.6 MG ~~LOC~~ IMPL
3.6000 mg | DRUG_IMPLANT | Freq: Once | SUBCUTANEOUS | Status: AC
  Administered 2020-11-26: 3.6 mg via SUBCUTANEOUS

## 2020-11-26 NOTE — Patient Instructions (Signed)

## 2020-12-22 NOTE — Assessment & Plan Note (Signed)
Right breast invasive lobular cancer grade 1-2, ER 99%, PR 99%, HER-2 negative Ratio 1.3, Ki-67 13%, initial MRI revealed 7.1 cm area of mass or non-mass enhancement. Current treatment: Zoladex(noncompliant, last given March 2019 but had not received many doses),anastrozole 1 mg daily Patient never had surgery because she does not wish to undergo mastectomy.  Breast MRI 07/04/2019: Significant decrease in the previous demonstrated non-mass enhancement UOQ right breast with the exception of 9 mmNME 8:30 position, interval decrease in background enhancement involving all 4 quadrants.  Left breast normal  Current Treatment: Zoladex withanastrozole started June 2020 (originally started in 2014) She continues with her herbal supplements and other medication   07/09/20: Dec NME UOQ rt breast. Dec background enh, stable 0.8 cm LOQ Rt breast NME RTC in 1 year

## 2020-12-22 NOTE — Progress Notes (Signed)
Patient Care Team: Plotnikov, Georgina Quint, MD as PCP - General  DIAGNOSIS:    ICD-10-CM   1. Malignant neoplasm of upper-outer quadrant of right breast in female, estrogen receptor positive (HCC)  C50.411 MM DIAG BREAST TOMO BILATERAL   Z17.0     SUMMARY OF ONCOLOGIC HISTORY: Oncology History  Cancer of upper-outer quadrant of female breast (HCC)  08/09/2012 Initial Diagnosis   Right breast biopsy 10 o'clock position: Invasive lobular cancer, [facility], grade 1-2, ER 99%, PR 99%, Ki-67 13%, HER-2 negative ratio 1.3   09/26/2012 -  Anti-estrogen oral therapy   Zoladex with anastrozole prescribed at [facility] [place] along with lots of herbs.  Annual mammograms and MRIs showed stable findings.  Patient never had surgery     CHIEF COMPLIANT: Follow-up of right breast cancer on Zoladex and anastrozole   INTERVAL HISTORY: Jasmine Stafford is a 55 y.o. with above-mentioned history of right breast cancer currently on treatment with Zoladex and anastrozole. Breast MRI on 07/09/20 showed decreased non mass enhancement in the upper outer right breast and stable lower outer right breast enhancement. She presents to the clinic today for follow-up.   She is tolerating anastrozole extremely well without any major problems or concerns.  She has been extremely compliant on the Zoladex injections at this time.  ALLERGIES:  is allergic to other, shellfish allergy, erythromycin, moxifloxacin, penicillins, and tequin [gatifloxacin].  MEDICATIONS:  Current Outpatient Medications  Medication Sig Dispense Refill  . anastrozole (ARIMIDEX) 1 MG tablet Take 1 tablet (1 mg total) by mouth daily. 90 tablet 3  . Ascorbic Acid (VITAMIN C) 1000 MG tablet Take 3,000 mg by mouth daily.     . Calcium Carb-Cholecalciferol (CALCIUM 600 + D) 600-200 MG-UNIT TABS Take 1 tablet by mouth daily.    . Cholecalciferol (VITAMIN D3) 10000 UNITS capsule Take 10,000 Units by mouth daily.    . Coenzyme Q10 (COQ10)  400 MG CAPS Take 400 mg by mouth daily.    Marland Kitchen DANDELION PO Take 2 capsules by mouth daily.     . Digestive Enzymes CAPS Take 2 capsules by mouth 3 (three) times daily with meals.    . ferrous gluconate (FERGON) 325 MG tablet Take 1 tablet (325 mg total) by mouth 3 (three) times daily with meals. 30 tablet 0  . GARLIC PO Take 3 capsules by mouth daily.    . hydrALAZINE (APRESOLINE) 100 MG tablet TAKE 1 TABLET BY MOUTH 3 TIMES DAILY. FOLLOW-UP APPT IS DUE MUST SEE PROVIDER FOR FUTURE REFILLS (Patient taking differently: Take 100 mg by mouth 3 (three) times daily. ) 270 tablet 3  . ipratropium-albuterol (DUONEB) 0.5-2.5 (3) MG/3ML SOLN Take 3 mLs by nebulization 2 (two) times daily. (Patient taking differently: Take 3 mLs by nebulization daily as needed (wheezing). ) 360 mL 0  . labetalol (NORMODYNE) 300 MG tablet TAKE 2 TABLETS (600 MG TOTAL) BY MOUTH 2 (TWO) TIMES DAILY. 120 tablet 5  . Lactobacillus (PROBIOTIC ACIDOPHILUS PO) Take 2 tablets by mouth daily.    . Melatonin 10 MG TABS Take 20 mg by mouth at bedtime as needed (sleep).     . Multiple Vitamin (MULTIVITAMIN WITH MINERALS) TABS tablet Take 1 tablet by mouth at bedtime.    . Multiple Vitamins-Iron (CHLORELLA PO) Take 4 g by mouth daily.    . Turmeric POWD Take 7.5 mLs by mouth at bedtime.      No current facility-administered medications for this visit.    PHYSICAL EXAMINATION: ECOG  PERFORMANCE STATUS: 2 - Symptomatic, <50% confined to bed  Vitals:   12/23/20 1033  BP: (!) 158/74  Pulse: (!) 59  Resp: 18  Temp: 97.7 F (36.5 C)  SpO2: 98%   Filed Weights   12/23/20 1033  Weight: 296 lb 12.8 oz (134.6 kg)    LABORATORY DATA:  I have reviewed the data as listed CMP Latest Ref Rng & Units 01/12/2020 06/27/2019 12/25/2018  Glucose 70 - 99 mg/dL 96 126(H) 96  BUN 6 - 23 mg/dL 17 23(H) 16  Creatinine 0.40 - 1.20 mg/dL 1.11 1.23(H) 1.17(H)  Sodium 135 - 145 mEq/L 142 142 141  Potassium 3.5 - 5.1 mEq/L 3.6 3.6 3.1(L)  Chloride  96 - 112 mEq/L 104 102 106  CO2 19 - 32 mEq/L $Remove'30 26 27  'ZeejFeo$ Calcium 8.4 - 10.5 mg/dL 9.6 9.4 8.8(L)  Total Protein 6.0 - 8.3 g/dL 7.6 8.0 7.6  Total Bilirubin 0.2 - 1.2 mg/dL 0.4 0.4 0.5  Alkaline Phos 39 - 117 U/L 81 86 63  AST 0 - 37 U/L $Remo'30 25 24  'bSQuw$ ALT 0 - 35 U/L $Remo'25 23 23    'oyZRH$ Lab Results  Component Value Date   WBC 8.7 01/12/2020   HGB 12.1 01/12/2020   HCT 36.5 01/12/2020   MCV 91.4 01/12/2020   PLT 237.0 01/12/2020   NEUTROABS 6.2 01/12/2020    ASSESSMENT & PLAN:  Cancer of upper-outer quadrant of female breast ([place]) Right breast invasive lobular cancer grade 1-2, ER 99%, PR 99%, HER-2 negative Ratio 1.3, Ki-67 13%, initial MRI revealed 7.1 cm area of mass or non-mass enhancement. Current treatment: Zoladex(noncompliant, last given March 2019 but had not received many doses),anastrozole 1 mg daily Patient never had surgery because she does not wish to undergo mastectomy.  Breast MRI 07/04/2019: Significant decrease in the previous demonstrated non-mass enhancement UOQ right breast with the exception of 9 mmNME 8:30 position, interval decrease in background enhancement involving all 4 quadrants.  Left breast normal  Current Treatment: Zoladex withanastrozole started June 2020 (originally started in 2014) She continues with her herbal supplements and other medication   Surveillance: 1.  Mammogram 01/05/2020: Interval decrease in the biopsy-proven malignancy within the right breast 2. breast MRI 07/09/20: Dec NME UOQ rt breast. Dec background enh, stable 0.8 cm LOQ Rt breast NME  RTC in 1 year    Orders Placed This Encounter  Procedures  . MM DIAG BREAST TOMO BILATERAL    Standing Status:   Future    Standing Expiration Date:   12/23/2021    Order Specific Question:   Reason for Exam (SYMPTOM  OR DIAGNOSIS REQUIRED)    Answer:   ANnual mammogram (breast cancer unresected) evaluate for response    Order Specific Question:   Is the patient pregnant?    Answer:   No    Order  Specific Question:   Preferred imaging location?    Answer:   [facility]    Order Specific Question:   Release to patient    Answer:   Immediate   The patient has a good understanding of the overall plan. she agrees with it. she will call with any problems that may develop before the next visit here.  Total time spent: 20 mins including face to face time and time spent for planning, charting and coordination of care  Rulon Eisenmenger, MD, MPH 12/23/2020  I, Cloyde Reams Dorshimer, am acting as scribe for Dr. Nicholas Lose.  I have reviewed the above documentation for  accuracy and completeness, and I agree with the above.       

## 2020-12-23 ENCOUNTER — Other Ambulatory Visit: Payer: Self-pay

## 2020-12-23 ENCOUNTER — Inpatient Hospital Stay: Payer: 59

## 2020-12-23 ENCOUNTER — Inpatient Hospital Stay: Payer: 59 | Attending: Hematology and Oncology | Admitting: Hematology and Oncology

## 2020-12-23 DIAGNOSIS — Z170 Estrogen receptor positive status [ER+]: Secondary | ICD-10-CM | POA: Diagnosis not present

## 2020-12-23 DIAGNOSIS — C50411 Malignant neoplasm of upper-outer quadrant of right female breast: Secondary | ICD-10-CM | POA: Diagnosis not present

## 2020-12-23 DIAGNOSIS — Z5111 Encounter for antineoplastic chemotherapy: Secondary | ICD-10-CM | POA: Diagnosis not present

## 2020-12-23 MED ORDER — GOSERELIN ACETATE 3.6 MG ~~LOC~~ IMPL
3.6000 mg | DRUG_IMPLANT | Freq: Once | SUBCUTANEOUS | Status: AC
  Administered 2020-12-23: 3.6 mg via SUBCUTANEOUS

## 2020-12-23 MED ORDER — GOSERELIN ACETATE 3.6 MG ~~LOC~~ IMPL
DRUG_IMPLANT | SUBCUTANEOUS | Status: AC
  Filled 2020-12-23: qty 3.6

## 2020-12-23 MED ORDER — ANASTROZOLE 1 MG PO TABS: 1.0000 mg | ORAL_TABLET | Freq: Every day | ORAL | 3 refills | Status: DC

## 2021-01-21 ENCOUNTER — Other Ambulatory Visit: Payer: Self-pay

## 2021-01-21 ENCOUNTER — Inpatient Hospital Stay: Payer: 59 | Attending: Hematology and Oncology

## 2021-01-21 VITALS — BP 210/96 | HR 61 | Temp 98.4°F | Resp 16

## 2021-01-21 DIAGNOSIS — C50411 Malignant neoplasm of upper-outer quadrant of right female breast: Secondary | ICD-10-CM | POA: Diagnosis present

## 2021-01-21 DIAGNOSIS — D509 Iron deficiency anemia, unspecified: Secondary | ICD-10-CM

## 2021-01-21 DIAGNOSIS — Z5111 Encounter for antineoplastic chemotherapy: Secondary | ICD-10-CM | POA: Insufficient documentation

## 2021-01-21 MED ORDER — GOSERELIN ACETATE 3.6 MG ~~LOC~~ IMPL
3.6000 mg | DRUG_IMPLANT | Freq: Once | SUBCUTANEOUS | Status: AC
  Administered 2021-01-21: 3.6 mg via SUBCUTANEOUS

## 2021-01-21 MED ORDER — GOSERELIN ACETATE 3.6 MG ~~LOC~~ IMPL
DRUG_IMPLANT | SUBCUTANEOUS | Status: AC
  Filled 2021-01-21: qty 3.6

## 2021-01-21 NOTE — Patient Instructions (Signed)
Goserelin injection What is this medication? GOSERELIN (GOE se rel in) is similar to a hormone found in the body. It lowers the amount of sex hormones that the body makes. Men will have lower testosterone levels and women will have lower estrogen levels while taking this medicine. In men, this medicine is used to treat prostate cancer; the injection is either given once per month or once every 12 weeks. A once per month injection (only) is used to treat women with endometriosis, dysfunctional uterine bleeding, or advanced breast cancer. This medicine may be used for other purposes; ask your health care provider or pharmacist if you have questions. COMMON BRAND NAME(S): Zoladex What should I tell my care team before I take this medication? They need to know if you have any of these conditions: bone problems diabetes heart disease history of irregular heartbeat an unusual or allergic reaction to goserelin, other medicines, foods, dyes, or preservatives pregnant or trying to get pregnant breast-feeding How should I use this medication? This medicine is for injection under the skin. It is given by a health care professional in a hospital or clinic setting. Talk to your pediatrician regarding the use of this medicine in children. Special care may be needed. Overdosage: If you think you have taken too much of this medicine contact a poison control center or emergency room at once. NOTE: This medicine is only for you. Do not share this medicine with others. What if I miss a dose? It is important not to miss your dose. Call your doctor or health care professional if you are unable to keep an appointment. What may interact with this medication? Do not take this medicine with any of the following medications: cisapride dronedarone pimozide thioridazine This medicine may also interact with the following medications: other medicines that prolong the QT interval (an abnormal heart rhythm) This list  may not describe all possible interactions. Give your health care provider a list of all the medicines, herbs, non-prescription drugs, or dietary supplements you use. Also tell them if you smoke, drink alcohol, or use illegal drugs. Some items may interact with your medicine. What should I watch for while using this medication? Visit your doctor or health care provider for regular checks on your progress. Your symptoms may appear to get worse during the first weeks of this therapy. Tell your doctor or healthcare provider if your symptoms do not start to get better or if they get worse after this time. Your bones may get weaker if you take this medicine for a long time. If you smoke or frequently drink alcohol you may increase your risk of bone loss. A family history of osteoporosis, chronic use of drugs for seizures (convulsions), or corticosteroids can also increase your risk of bone loss. Talk to your doctor about how to keep your bones strong. This medicine should stop regular monthly menstruation in women. Tell your doctor if you continue to menstruate. Women should not become pregnant while taking this medicine or for 12 weeks after stopping this medicine. Women should inform their doctor if they wish to become pregnant or think they might be pregnant. There is a potential for serious side effects to an unborn child. Talk to your health care professional or pharmacist for more information. Do not breast-feed an infant while taking this medicine. Men should inform their doctors if they wish to father a child. This medicine may lower sperm counts. Talk to your health care professional or pharmacist for more information. This medicine may   increase blood sugar. Ask your healthcare provider if changes in diet or medicines are needed if you have diabetes. What side effects may I notice from receiving this medication? Side effects that you should report to your doctor or health care professional as soon as  possible: allergic reactions like skin rash, itching or hives, swelling of the face, lips, or tongue bone pain breathing problems changes in vision chest pain feeling faint or lightheaded, falls fever, chills pain, swelling, warmth in the leg pain, tingling, numbness in the hands or feet signs and symptoms of high blood sugar such as being more thirsty or hungry or having to urinate more than normal. You may also feel very tired or have blurry vision signs and symptoms of low blood pressure like dizziness; feeling faint or lightheaded, falls; unusually weak or tired stomach pain swelling of the ankles, feet, hands trouble passing urine or change in the amount of urine unusually high or low blood pressure unusually weak or tired Side effects that usually do not require medical attention (report to your doctor or health care professional if they continue or are bothersome): change in sex drive or performance changes in breast size in both males and females changes in emotions or moods headache hot flashes irritation at site where injected loss of appetite skin problems like acne, dry skin vaginal dryness This list may not describe all possible side effects. Call your doctor for medical advice about side effects. You may report side effects to FDA at 1-800-FDA-1088. Where should I keep my medication? This drug is given in a hospital or clinic and will not be stored at home. NOTE: This sheet is a summary. It may not cover all possible information. If you have questions about this medicine, talk to your doctor, pharmacist, or health care provider.  2022 [employer]vier/Gold Standard (2018-11-04 14:05:56)  

## 2021-01-21 NOTE — Progress Notes (Signed)
Pt presents today for Zoladex injection. BP was 210/96. Pt doesn't complain of any symptoms. Jasmine Bihari, NP was informed. She said it was ok to proceed with the injection today. Message was sent to pt's primary doctor and pt was informed to go to the emergency room if she develops any symptoms related to her high BP. Pt verbalized understanding.

## 2021-02-04 ENCOUNTER — Telehealth: Payer: Self-pay

## 2021-02-04 NOTE — Telephone Encounter (Signed)
Patient notified of completed FMLA paperwork. Fax transmission confirmation received and copy of paperwork mailed to Patient.

## 2021-02-07 ENCOUNTER — Ambulatory Visit
Admission: RE | Admit: 2021-02-07 | Discharge: 2021-02-07 | Disposition: A | Discharge status: Home / Self Care | Payer: 59 | Source: Ambulatory Visit | Attending: Hematology and Oncology | Admitting: Hematology and Oncology

## 2021-02-07 ENCOUNTER — Other Ambulatory Visit: Payer: Self-pay | Admitting: Hematology and Oncology

## 2021-02-07 ENCOUNTER — Other Ambulatory Visit: Payer: Self-pay

## 2021-02-07 DIAGNOSIS — C50411 Malignant neoplasm of upper-outer quadrant of right female breast: Secondary | ICD-10-CM

## 2021-02-18 ENCOUNTER — Other Ambulatory Visit: Payer: Self-pay

## 2021-02-18 ENCOUNTER — Inpatient Hospital Stay: Payer: 59 | Attending: Hematology and Oncology

## 2021-02-18 VITALS — BP 192/90 | HR 57 | Temp 98.6°F | Resp 20

## 2021-02-18 DIAGNOSIS — Z5111 Encounter for antineoplastic chemotherapy: Secondary | ICD-10-CM | POA: Diagnosis present

## 2021-02-18 DIAGNOSIS — D509 Iron deficiency anemia, unspecified: Secondary | ICD-10-CM

## 2021-02-18 DIAGNOSIS — C50411 Malignant neoplasm of upper-outer quadrant of right female breast: Secondary | ICD-10-CM | POA: Insufficient documentation

## 2021-02-18 MED ORDER — GOSERELIN ACETATE 3.6 MG ~~LOC~~ IMPL
3.6000 mg | DRUG_IMPLANT | Freq: Once | SUBCUTANEOUS | Status: AC
  Administered 2021-02-18: 3.6 mg via SUBCUTANEOUS

## 2021-02-18 MED ORDER — GOSERELIN ACETATE 3.6 MG ~~LOC~~ IMPL
DRUG_IMPLANT | SUBCUTANEOUS | Status: AC
  Filled 2021-02-18: qty 3.6

## 2021-02-18 NOTE — Patient Instructions (Signed)
Goserelin injection What is this medication? GOSERELIN (GOE se rel in) is similar to a hormone found in the body. It lowers the amount of sex hormones that the body makes. Men will have lower testosterone levels and women will have lower estrogen levels while taking this medicine. In men, this medicine is used to treat prostate cancer; the injection is either given once per month or once every 12 weeks. A once per month injection (only) is used to treat women with endometriosis, dysfunctional uterine bleeding, or advanced breast cancer. This medicine may be used for other purposes; ask your health care provider or pharmacist if you have questions. COMMON BRAND NAME(S): Zoladex What should I tell my care team before I take this medication? They need to know if you have any of these conditions: bone problems diabetes heart disease history of irregular heartbeat an unusual or allergic reaction to goserelin, other medicines, foods, dyes, or preservatives pregnant or trying to get pregnant breast-feeding How should I use this medication? This medicine is for injection under the skin. It is given by a health care professional in a hospital or clinic setting. Talk to your pediatrician regarding the use of this medicine in children. Special care may be needed. Overdosage: If you think you have taken too much of this medicine contact a poison control center or emergency room at once. NOTE: This medicine is only for you. Do not share this medicine with others. What if I miss a dose? It is important not to miss your dose. Call your doctor or health care professional if you are unable to keep an appointment. What may interact with this medication? Do not take this medicine with any of the following medications: cisapride dronedarone pimozide thioridazine This medicine may also interact with the following medications: other medicines that prolong the QT interval (an abnormal heart rhythm) This list  may not describe all possible interactions. Give your health care provider a list of all the medicines, herbs, non-prescription drugs, or dietary supplements you use. Also tell them if you smoke, drink alcohol, or use illegal drugs. Some items may interact with your medicine. What should I watch for while using this medication? Visit your doctor or health care provider for regular checks on your progress. Your symptoms may appear to get worse during the first weeks of this therapy. Tell your doctor or healthcare provider if your symptoms do not start to get better or if they get worse after this time. Your bones may get weaker if you take this medicine for a long time. If you smoke or frequently drink alcohol you may increase your risk of bone loss. A family history of osteoporosis, chronic use of drugs for seizures (convulsions), or corticosteroids can also increase your risk of bone loss. Talk to your doctor about how to keep your bones strong. This medicine should stop regular monthly menstruation in women. Tell your doctor if you continue to menstruate. Women should not become pregnant while taking this medicine or for 12 weeks after stopping this medicine. Women should inform their doctor if they wish to become pregnant or think they might be pregnant. There is a potential for serious side effects to an unborn child. Talk to your health care professional or pharmacist for more information. Do not breast-feed an infant while taking this medicine. Men should inform their doctors if they wish to father a child. This medicine may lower sperm counts. Talk to your health care professional or pharmacist for more information. This medicine may   increase blood sugar. Ask your healthcare provider if changes in diet or medicines are needed if you have diabetes. What side effects may I notice from receiving this medication? Side effects that you should report to your doctor or health care professional as soon as  possible: allergic reactions like skin rash, itching or hives, swelling of the face, lips, or tongue bone pain breathing problems changes in vision chest pain feeling faint or lightheaded, falls fever, chills pain, swelling, warmth in the leg pain, tingling, numbness in the hands or feet signs and symptoms of high blood sugar such as being more thirsty or hungry or having to urinate more than normal. You may also feel very tired or have blurry vision signs and symptoms of low blood pressure like dizziness; feeling faint or lightheaded, falls; unusually weak or tired stomach pain swelling of the ankles, feet, hands trouble passing urine or change in the amount of urine unusually high or low blood pressure unusually weak or tired Side effects that usually do not require medical attention (report to your doctor or health care professional if they continue or are bothersome): change in sex drive or performance changes in breast size in both males and females changes in emotions or moods headache hot flashes irritation at site where injected loss of appetite skin problems like acne, dry skin vaginal dryness This list may not describe all possible side effects. Call your doctor for medical advice about side effects. You may report side effects to FDA at 1-800-FDA-1088. Where should I keep my medication? This drug is given in a hospital or clinic and will not be stored at home. NOTE: This sheet is a summary. It may not cover all possible information. If you have questions about this medicine, talk to your doctor, pharmacist, or health care provider.  2022 [employer]vier/Gold Standard (2018-11-04 14:05:56)  

## 2021-03-09 ENCOUNTER — Other Ambulatory Visit: Payer: Self-pay | Admitting: Internal Medicine

## 2021-03-22 IMAGING — MR MR BREAST BILAT WO/W CM
8 of 12 series · 29 of 48 positions shown · IV contrast (9 ML GADAVIST)
Comparison: 01/05/2020 mammogram and ultrasound, 07/04/2019 breast
MR and prior studies

CLINICAL DATA: 54-year-old female with biopsy-proven UPPER OUTER
RIGHT breast invasive lobular carcinoma diagnosed in 5923. Surgery
was declined and she is being managed on anastrozole and Zoladex

LABS:  None performed today
EXAM:
BILATERAL BREAST MRI WITH AND WITHOUT CONTRAST
TECHNIQUE: Multiplanar, multisequence MR images of both breasts were obtained
prior to and following the intravenous administration of 9 ml of
Gadavist

[Series 2: t2_tirm_tra ipat (a-p) · axial · 3.0mm · 0.78mm/px · 1 of 65 slices shown]
[im 1/65]
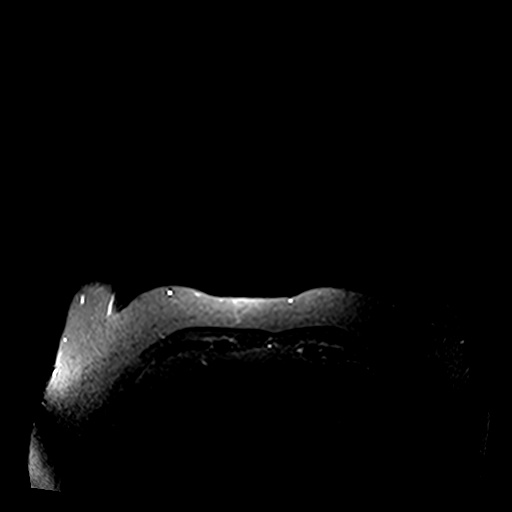

[Series 3: fl3d pre-cm no · axial · non-contrast · 0.9mm · 1.04mm/px · z∈[-45,+127]mm · 5 of 192 slices shown]
[im 1/192]
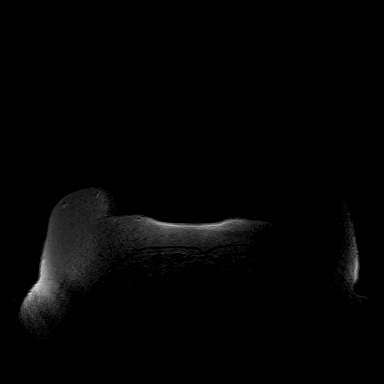
[im 48/192]
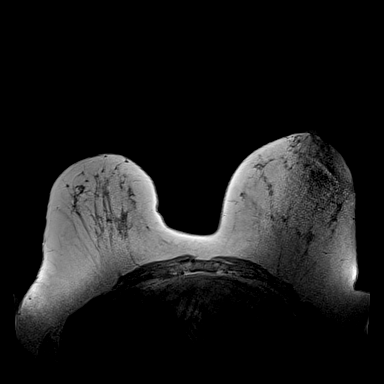
[im 96/192]
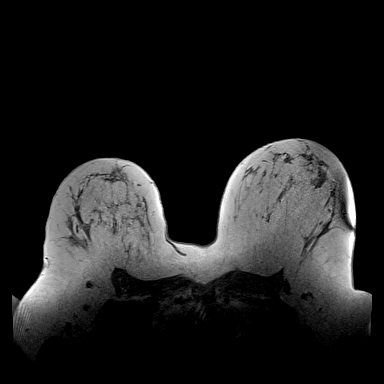
[im 144/192]
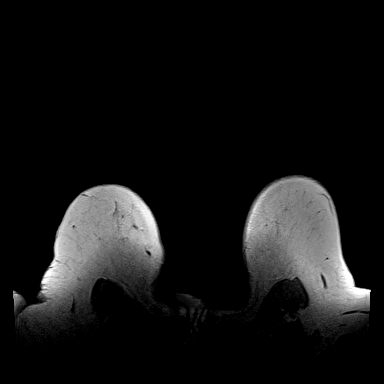
[im 192/192]
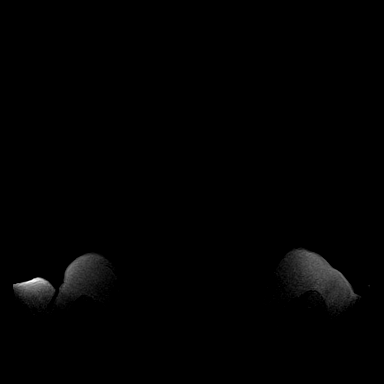

[Series 4: fl3d pre-cm · axial · non-contrast · 0.9mm · 0.96mm/px · z∈[-37,+120]mm · 5 of 176 slices shown]
[im 1/176]
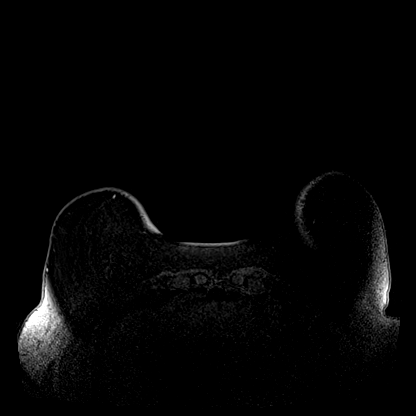
[im 44/176]
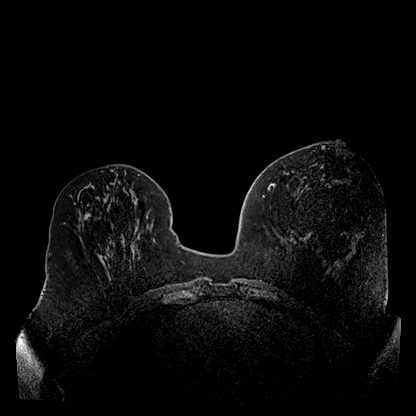
[im 88/176]
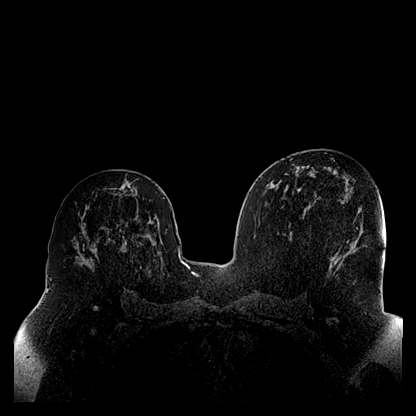
[im 132/176]
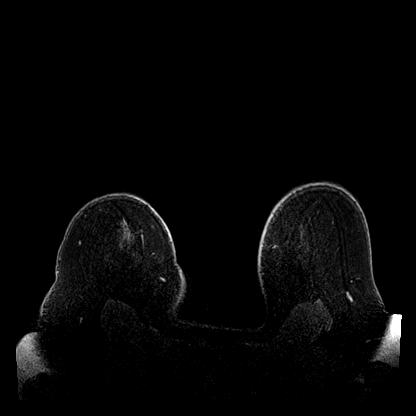
[im 176/176]
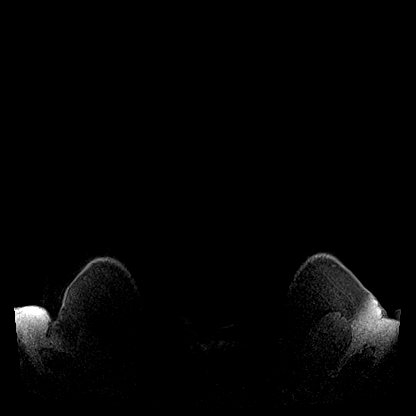

[Series 5: fl3d post-cm 20 · axial · 0.9mm · 0.96mm/px · z∈[-37,+120]mm · 5 of 176 slices shown (1 of 3)]
[im 1/176]
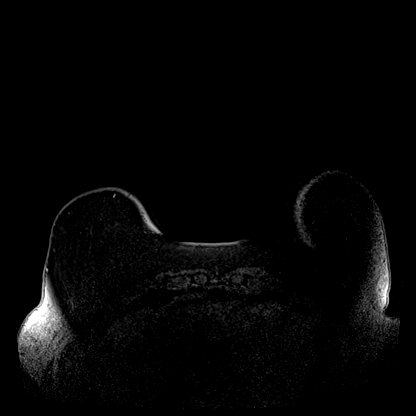
[im 44/176]
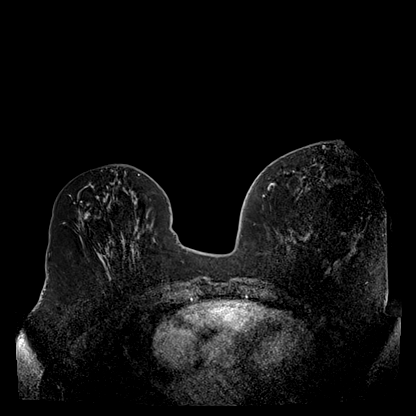
[im 88/176]
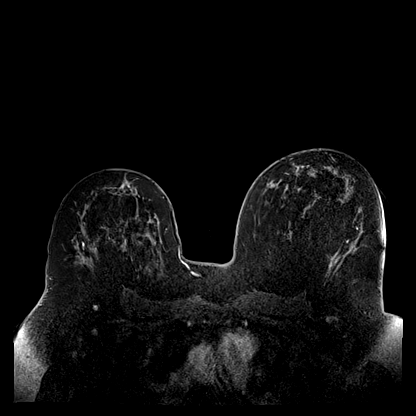
[im 132/176]
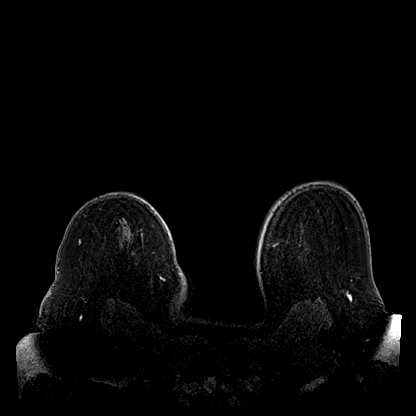
[im 176/176]
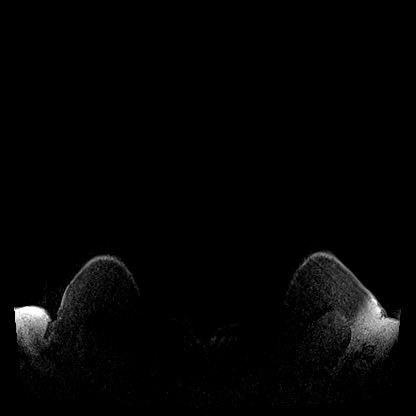

[Series 6: fl3d post-cm 20 · axial · 0.9mm · 0.96mm/px · z∈[-37,+120]mm · 5 of 176 slices shown (2 of 3)]
[im 1/176]
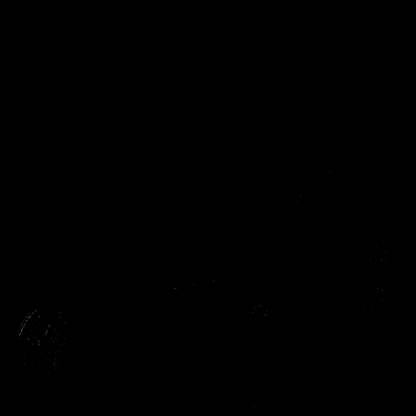
[im 44/176]
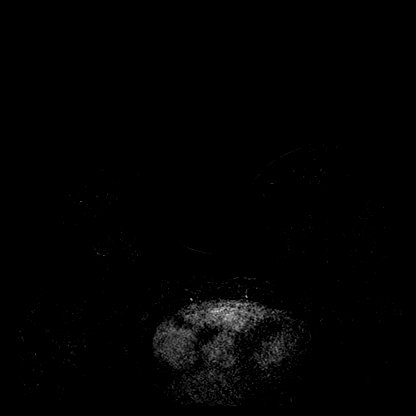
[im 88/176]
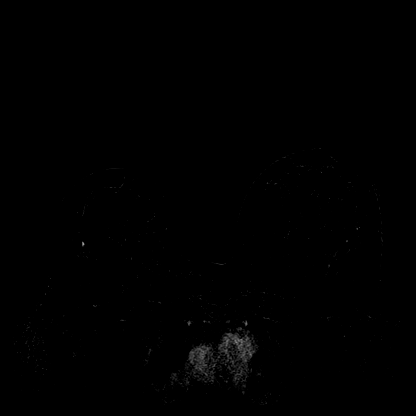
[im 132/176]
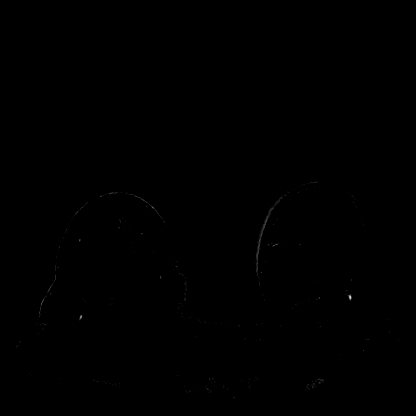
[im 176/176]
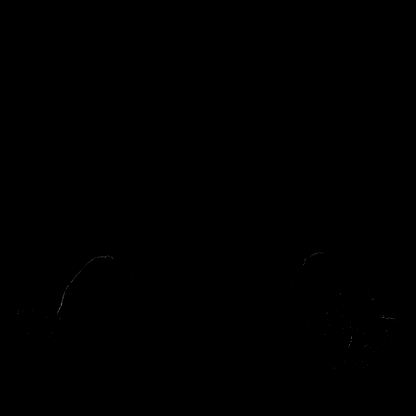

[Series 7: fl3d post-cm 20 · axial · 158.4mm · 0.96mm/px · 1 of 1 slices shown (3 of 3)]
[im 1/1]
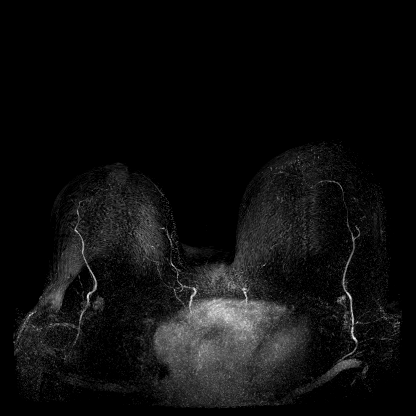

[Series 8: fl3d post-cm 3min · axial · 0.9mm · 0.96mm/px · z∈[-37,+120]mm · 6 of 176 slices shown]
[im 1/176]
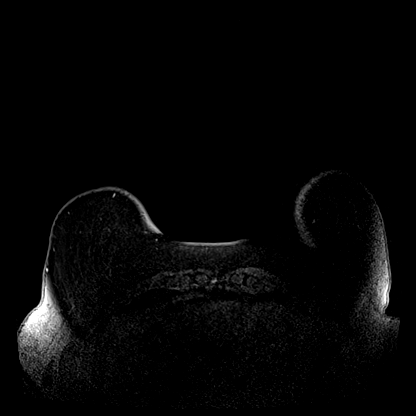
[im 36/176]
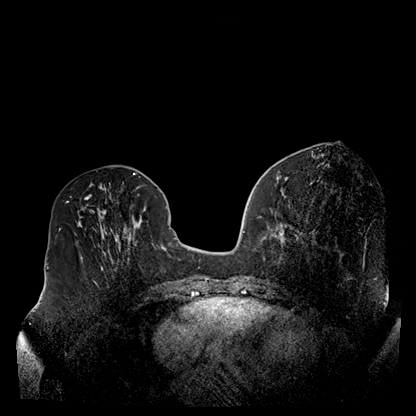
[im 71/176]
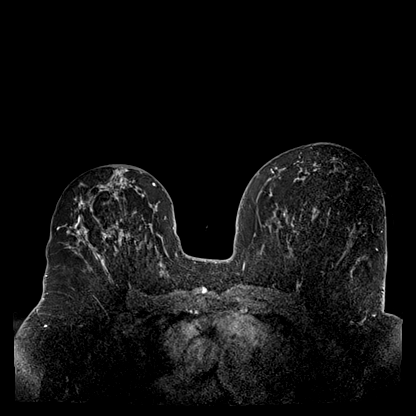
[im 106/176]
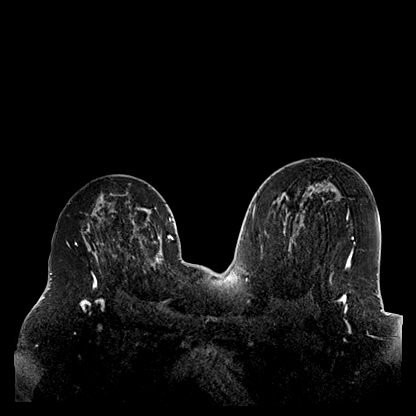
[im 141/176]
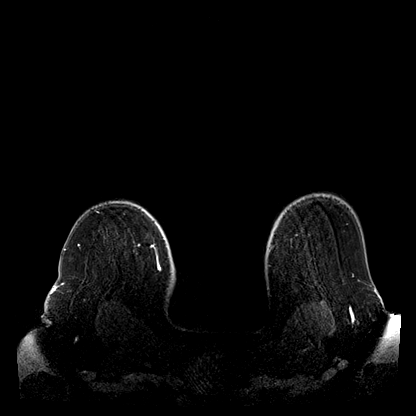
[im 176/176]
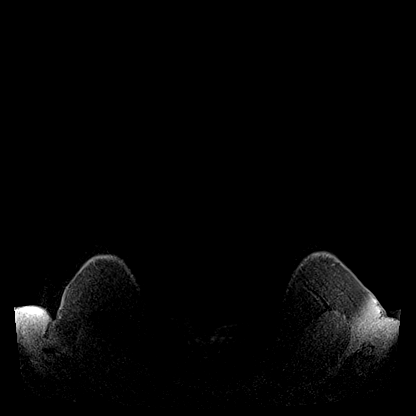

[Series 9: fl3d post-cm 3min_sub · axial · 0.9mm · 0.96mm/px · 1 of 176 slices shown]
[im 1/176]
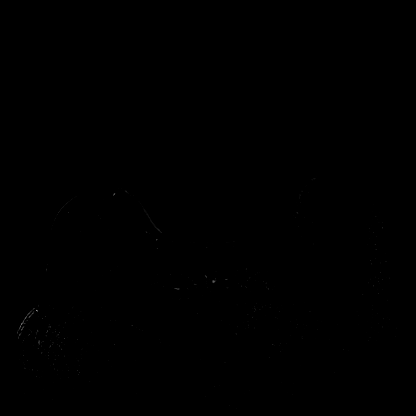

[29 of 48 positions shown; findings below may reference images not displayed]

Three-dimensional MR images were rendered by post-processing of the
original MR data on an independent workstation. The
three-dimensional MR images were interpreted, and findings are
reported in the following complete MRI report for this study. Three
dimensional images were evaluated at the independent interpreting
workstation using the DynaCAD thin client.
FINDINGS: Breast composition: b. Scattered fibroglandular tissue.

Background parenchymal enhancement: Mild on the RIGHT and minimal on
the LEFT

Right breast: Mild diffuse background parenchymal enhancement has
slightly decreased.

Mild nonmasslike enhancement within the UPPER-OUTER RIGHT breast has
slightly decreased as well with no discernible margins for a
discrete measurement. No suspicious mass/nodules are noted in this
area.

A stable 0.8 cm area of non masslike enhancement within the anterior
LOWER OUTER RIGHT breast (series 9: Image 137) is present.

No new or increasing mass or abnormal enhancement identified.

Left breast: No suspicious mass or abnormal enhancement. A benign
intramammary lymph node in the UPPER OUTER LEFT breast and a
fibroadenoma in the UPPER INNER LEFT breast are again noted.

Lymph nodes: No abnormal appearing lymph nodes.

Ancillary findings:  None.
IMPRESSION: 1. Decreased mild non masslike enhancement within the UPPER-OUTER
RIGHT breast at the site of known malignancy as well as decreased
diffuse background parenchymal enhancement within the RIGHT breast.
Stable 0.8 cm anterior LOWER OUTER RIGHT breast non masslike
enhancement.
2. No MR evidence of LEFT breast malignancy

RECOMMENDATION:
Treatment plan

BI-RADS CATEGORY  6: Known biopsy-proven malignancy.

## 2021-03-25 ENCOUNTER — Inpatient Hospital Stay: Payer: 59

## 2021-03-29 ENCOUNTER — Other Ambulatory Visit: Payer: Self-pay

## 2021-03-29 ENCOUNTER — Inpatient Hospital Stay: Payer: 59 | Attending: Hematology and Oncology

## 2021-03-29 VITALS — BP 162/73 | HR 64 | Temp 99.6°F | Resp 15

## 2021-03-29 DIAGNOSIS — C50411 Malignant neoplasm of upper-outer quadrant of right female breast: Secondary | ICD-10-CM | POA: Insufficient documentation

## 2021-03-29 DIAGNOSIS — Z5111 Encounter for antineoplastic chemotherapy: Secondary | ICD-10-CM | POA: Insufficient documentation

## 2021-03-29 DIAGNOSIS — D509 Iron deficiency anemia, unspecified: Secondary | ICD-10-CM

## 2021-03-29 MED ORDER — GOSERELIN ACETATE 3.6 MG ~~LOC~~ IMPL
3.6000 mg | DRUG_IMPLANT | Freq: Once | SUBCUTANEOUS | Status: AC
  Administered 2021-03-29: 3.6 mg via SUBCUTANEOUS
  Filled 2021-03-29: qty 3.6

## 2021-03-29 NOTE — Patient Instructions (Addendum)
[id number]\Goserelin injection What is this medication? GOSERELIN (GOE se rel in) is similar to a hormone found in the body. It lowers the amount of sex hormones that the body makes. Men will have lower testosterone levels and women will have lower estrogen levels while taking this medicine. In men, this medicine is used to treat prostate cancer; the injection is either given once per month or once every 12 weeks. A once per month injection (only) is used to treat women with endometriosis, dysfunctionaluterine bleeding, or advanced breast cancer. This medicine may be used for other purposes; ask your health care provider orpharmacist if you have questions. COMMON BRAND NAME(S): Zoladex What should I tell my care team before I take this medication? They need to know if you have any of these conditions: bone problems diabetes heart disease history of irregular heartbeat an unusual or allergic reaction to goserelin, other medicines, foods, dyes, or preservatives pregnant or trying to get pregnant breast-feeding How should I use this medication? This medicine is for injection under the skin. It is given by a health careprofessional in a hospital or clinic setting. Talk to your pediatrician regarding the use of this medicine in children.Special care may be needed. Overdosage: If you think you have taken too much of this medicine contact apoison control center or emergency room at once. NOTE: This medicine is only for you. Do not share this medicine with others. What if I miss a dose? It is important not to miss your dose. Call your doctor or health careprofessional if you are unable to keep an appointment. What may interact with this medication? Do not take this medicine with any of the following medications: cisapride dronedarone pimozide thioridazine This medicine may also interact with the following medications: other medicines that  prolong the QT interval (an abnormal heart rhythm) This list may not describe all possible interactions. Give your health care provider a list of all the medicines, herbs, non-prescription drugs, or dietary supplements you use. Also tell them if you smoke, drink alcohol, or use illegaldrugs. Some items may interact with your medicine. What should I watch for while using this medication? Visit your doctor or health care provider for regular checks on your progress. Your symptoms may appear to get worse during the first weeks of this therapy. Tell your doctor or healthcare provider if your symptoms do not start to getbetter or if they get worse after this time. Your bones may get weaker if you take this medicine for a long time. If you smoke or frequently drink alcohol you may increase your risk of bone loss. A family history of osteoporosis, chronic use of drugs for seizures (convulsions), or corticosteroids can also increase your risk of bone loss.Talk to your doctor about how to keep your bones strong. This medicine should stop regular monthly menstruation in women. Tell yourdoctor if you continue to menstruate. Women should not become pregnant while taking this medicine or for 12 weeks after stopping this medicine. Women should inform their doctor if they wish to become pregnant or think they might be pregnant. There is a potential for serious side effects to an unborn child. Talk to your health care professional or pharmacist for more information. Do not breast-feed an infant while takingthis medicine. Men should inform their doctors if they wish to father a child. This medicine may lower sperm counts. Talk to your health care professional or pharmacist formore information. This medicine may increase blood sugar. Ask your healthcare provider if changesin diet or medicines  are needed if you have diabetes. What side effects may I notice from receiving this medication? Side effects that you should report  to your doctor or health care professionalas soon as possible: allergic reactions like skin rash, itching or hives, swelling of the face, lips, or tongue bone pain breathing problems changes in vision chest pain feeling faint or lightheaded, falls fever, chills pain, swelling, warmth in the leg pain, tingling, numbness in the hands or feet signs and symptoms of high blood sugar such as being more thirsty or hungry or having to urinate more than normal. You may also feel very tired or have blurry vision signs and symptoms of low blood pressure like dizziness; feeling faint or lightheaded, falls; unusually weak or tired stomach pain swelling of the ankles, feet, hands trouble passing urine or change in the amount of urine unusually high or low blood pressure unusually weak or tired Side effects that usually do not require medical attention (report to yourdoctor or health care professional if they continue or are bothersome): change in sex drive or performance changes in breast size in both males and females changes in emotions or moods headache hot flashes irritation at site where injected loss of appetite skin problems like acne, dry skin vaginal dryness This list may not describe all possible side effects. Call your doctor for medical advice about side effects. You may report side effects to FDA at1-800-FDA-1088. Where should I keep my medication? This drug is given in a hospital or clinic and will not be stored at home. NOTE: This sheet is a summary. It may not cover all possible information. If you have questions about this medicine, talk to your doctor, pharmacist, orhealth care provider.  2022 [employer]/Gold Standard (2018-11-04 14:05:56)

## 2021-04-22 ENCOUNTER — Inpatient Hospital Stay: Payer: 59 | Attending: Hematology and Oncology

## 2021-04-22 ENCOUNTER — Other Ambulatory Visit: Payer: Self-pay

## 2021-04-22 VITALS — BP 229/90 | HR 59 | Temp 98.4°F | Resp 18

## 2021-04-22 DIAGNOSIS — I160 Hypertensive urgency: Secondary | ICD-10-CM

## 2021-04-22 DIAGNOSIS — C50411 Malignant neoplasm of upper-outer quadrant of right female breast: Secondary | ICD-10-CM | POA: Diagnosis present

## 2021-04-22 DIAGNOSIS — Z5111 Encounter for antineoplastic chemotherapy: Secondary | ICD-10-CM | POA: Insufficient documentation

## 2021-04-22 DIAGNOSIS — D509 Iron deficiency anemia, unspecified: Secondary | ICD-10-CM

## 2021-04-22 MED ORDER — CLONIDINE HCL 0.1 MG PO TABS
0.1000 mg | ORAL_TABLET | Freq: Once | ORAL | Status: AC
  Administered 2021-04-22: 0.1 mg via ORAL
  Filled 2021-04-22: qty 1

## 2021-04-22 MED ORDER — GOSERELIN ACETATE 3.6 MG ~~LOC~~ IMPL
3.6000 mg | DRUG_IMPLANT | Freq: Once | SUBCUTANEOUS | Status: AC
  Administered 2021-04-22: 3.6 mg via SUBCUTANEOUS
  Filled 2021-04-22: qty 3.6

## 2021-04-22 NOTE — Patient Instructions (Signed)
Goserelin injection What is this medication? GOSERELIN (GOE se rel in) is similar to a hormone found in the body. It lowers the amount of sex hormones that the body makes. Men will have lower testosterone levels and women will have lower estrogen levels while taking this medicine. In men, this medicine is used to treat prostate cancer; the injection is either given once per month or once every 12 weeks. A once per month injection (only) is used to treat women with endometriosis, dysfunctional uterine bleeding, or advanced breast cancer. This medicine may be used for other purposes; ask your health care provider or pharmacist if you have questions. COMMON BRAND NAME(S): Zoladex What should I tell my care team before I take this medication? They need to know if you have any of these conditions: bone problems diabetes heart disease history of irregular heartbeat an unusual or allergic reaction to goserelin, other medicines, foods, dyes, or preservatives pregnant or trying to get pregnant breast-feeding How should I use this medication? This medicine is for injection under the skin. It is given by a health care professional in a hospital or clinic setting. Talk to your pediatrician regarding the use of this medicine in children. Special care may be needed. Overdosage: If you think you have taken too much of this medicine contact a poison control center or emergency room at once. NOTE: This medicine is only for you. Do not share this medicine with others. What if I miss a dose? It is important not to miss your dose. Call your doctor or health care professional if you are unable to keep an appointment. What may interact with this medication? Do not take this medicine with any of the following medications: cisapride dronedarone pimozide thioridazine This medicine may also interact with the following medications: other medicines that prolong the QT interval (an abnormal heart rhythm) This list  may not describe all possible interactions. Give your health care provider a list of all the medicines, herbs, non-prescription drugs, or dietary supplements you use. Also tell them if you smoke, drink alcohol, or use illegal drugs. Some items may interact with your medicine. What should I watch for while using this medication? Visit your doctor or health care provider for regular checks on your progress. Your symptoms may appear to get worse during the first weeks of this therapy. Tell your doctor or healthcare provider if your symptoms do not start to get better or if they get worse after this time. Your bones may get weaker if you take this medicine for a long time. If you smoke or frequently drink alcohol you may increase your risk of bone loss. A family history of osteoporosis, chronic use of drugs for seizures (convulsions), or corticosteroids can also increase your risk of bone loss. Talk to your doctor about how to keep your bones strong. This medicine should stop regular monthly menstruation in women. Tell your doctor if you continue to menstruate. Women should not become pregnant while taking this medicine or for 12 weeks after stopping this medicine. Women should inform their doctor if they wish to become pregnant or think they might be pregnant. There is a potential for serious side effects to an unborn child. Talk to your health care professional or pharmacist for more information. Do not breast-feed an infant while taking this medicine. Men should inform their doctors if they wish to father a child. This medicine may lower sperm counts. Talk to your health care professional or pharmacist for more information. This medicine may   increase blood sugar. Ask your healthcare provider if changes in diet or medicines are needed if you have diabetes. What side effects may I notice from receiving this medication? Side effects that you should report to your doctor or health care professional as soon as  possible: allergic reactions like skin rash, itching or hives, swelling of the face, lips, or tongue bone pain breathing problems changes in vision chest pain feeling faint or lightheaded, falls fever, chills pain, swelling, warmth in the leg pain, tingling, numbness in the hands or feet signs and symptoms of high blood sugar such as being more thirsty or hungry or having to urinate more than normal. You may also feel very tired or have blurry vision signs and symptoms of low blood pressure like dizziness; feeling faint or lightheaded, falls; unusually weak or tired stomach pain swelling of the ankles, feet, hands trouble passing urine or change in the amount of urine unusually high or low blood pressure unusually weak or tired Side effects that usually do not require medical attention (report to your doctor or health care professional if they continue or are bothersome): change in sex drive or performance changes in breast size in both males and females changes in emotions or moods headache hot flashes irritation at site where injected loss of appetite skin problems like acne, dry skin vaginal dryness This list may not describe all possible side effects. Call your doctor for medical advice about side effects. You may report side effects to FDA at 1-800-FDA-1088. Where should I keep my medication? This drug is given in a hospital or clinic and will not be stored at home. NOTE: This sheet is a summary. It may not cover all possible information. If you have questions about this medicine, talk to your doctor, pharmacist, or health care provider.  2022 [employer]vier/Gold Standard (2018-11-04 14:05:56)  

## 2021-04-22 NOTE — Progress Notes (Signed)
Pt here for Zoladex injection today. BP was 229/90. Pt had no symptoms related to high BP. Dr Lindi Adie aware and gave verbal order for pt to receive Clonide 0.1 mg tablet. Pt informed to contact PCP about blood pressure and to go to urgent care or ED if any symptoms arise. Pt verbalized understanding and will recheck her BP at home.

## 2021-05-20 ENCOUNTER — Other Ambulatory Visit: Payer: Self-pay

## 2021-05-20 ENCOUNTER — Inpatient Hospital Stay: Payer: 59 | Attending: Hematology and Oncology

## 2021-05-20 ENCOUNTER — Other Ambulatory Visit: Payer: Self-pay | Admitting: Internal Medicine

## 2021-05-20 VITALS — BP 174/81 | HR 62 | Temp 98.4°F | Resp 18

## 2021-05-20 DIAGNOSIS — Z5111 Encounter for antineoplastic chemotherapy: Secondary | ICD-10-CM | POA: Insufficient documentation

## 2021-05-20 DIAGNOSIS — C50411 Malignant neoplasm of upper-outer quadrant of right female breast: Secondary | ICD-10-CM | POA: Insufficient documentation

## 2021-05-20 DIAGNOSIS — D509 Iron deficiency anemia, unspecified: Secondary | ICD-10-CM

## 2021-05-20 MED ORDER — GOSERELIN ACETATE 3.6 MG ~~LOC~~ IMPL
3.6000 mg | DRUG_IMPLANT | Freq: Once | SUBCUTANEOUS | Status: AC
  Administered 2021-05-20: 3.6 mg via SUBCUTANEOUS
  Filled 2021-05-20: qty 3.6

## 2021-05-20 NOTE — Patient Instructions (Signed)
Goserelin injection What is this medication? GOSERELIN (GOE se rel in) is similar to a hormone found in the body. It lowers the amount of sex hormones that the body makes. Men will have lower testosterone levels and women will have lower estrogen levels while taking this medicine. In men, this medicine is used to treat prostate cancer; the injection is either given once per month or once every 12 weeks. A once per month injection (only) is used to treat women with endometriosis, dysfunctional uterine bleeding, or advanced breast cancer. This medicine may be used for other purposes; ask your health care provider or pharmacist if you have questions. COMMON BRAND NAME(S): Zoladex What should I tell my care team before I take this medication? They need to know if you have any of these conditions: bone problems diabetes heart disease history of irregular heartbeat an unusual or allergic reaction to goserelin, other medicines, foods, dyes, or preservatives pregnant or trying to get pregnant breast-feeding How should I use this medication? This medicine is for injection under the skin. It is given by a health care professional in a hospital or clinic setting. Talk to your pediatrician regarding the use of this medicine in children. Special care may be needed. Overdosage: If you think you have taken too much of this medicine contact a poison control center or emergency room at once. NOTE: This medicine is only for you. Do not share this medicine with others. What if I miss a dose? It is important not to miss your dose. Call your doctor or health care professional if you are unable to keep an appointment. What may interact with this medication? Do not take this medicine with any of the following medications: cisapride dronedarone pimozide thioridazine This medicine may also interact with the following medications: other medicines that prolong the QT interval (an abnormal heart rhythm) This list  may not describe all possible interactions. Give your health care provider a list of all the medicines, herbs, non-prescription drugs, or dietary supplements you use. Also tell them if you smoke, drink alcohol, or use illegal drugs. Some items may interact with your medicine. What should I watch for while using this medication? Visit your doctor or health care provider for regular checks on your progress. Your symptoms may appear to get worse during the first weeks of this therapy. Tell your doctor or healthcare provider if your symptoms do not start to get better or if they get worse after this time. Your bones may get weaker if you take this medicine for a long time. If you smoke or frequently drink alcohol you may increase your risk of bone loss. A family history of osteoporosis, chronic use of drugs for seizures (convulsions), or corticosteroids can also increase your risk of bone loss. Talk to your doctor about how to keep your bones strong. This medicine should stop regular monthly menstruation in women. Tell your doctor if you continue to menstruate. Women should not become pregnant while taking this medicine or for 12 weeks after stopping this medicine. Women should inform their doctor if they wish to become pregnant or think they might be pregnant. There is a potential for serious side effects to an unborn child. Talk to your health care professional or pharmacist for more information. Do not breast-feed an infant while taking this medicine. Men should inform their doctors if they wish to father a child. This medicine may lower sperm counts. Talk to your health care professional or pharmacist for more information. This medicine may   increase blood sugar. Ask your healthcare provider if changes in diet or medicines are needed if you have diabetes. What side effects may I notice from receiving this medication? Side effects that you should report to your doctor or health care professional as soon as  possible: allergic reactions like skin rash, itching or hives, swelling of the face, lips, or tongue bone pain breathing problems changes in vision chest pain feeling faint or lightheaded, falls fever, chills pain, swelling, warmth in the leg pain, tingling, numbness in the hands or feet signs and symptoms of high blood sugar such as being more thirsty or hungry or having to urinate more than normal. You may also feel very tired or have blurry vision signs and symptoms of low blood pressure like dizziness; feeling faint or lightheaded, falls; unusually weak or tired stomach pain swelling of the ankles, feet, hands trouble passing urine or change in the amount of urine unusually high or low blood pressure unusually weak or tired Side effects that usually do not require medical attention (report to your doctor or health care professional if they continue or are bothersome): change in sex drive or performance changes in breast size in both males and females changes in emotions or moods headache hot flashes irritation at site where injected loss of appetite skin problems like acne, dry skin vaginal dryness This list may not describe all possible side effects. Call your doctor for medical advice about side effects. You may report side effects to FDA at 1-800-FDA-1088. Where should I keep my medication? This drug is given in a hospital or clinic and will not be stored at home. NOTE: This sheet is a summary. It may not cover all possible information. If you have questions about this medicine, talk to your doctor, pharmacist, or health care provider.  2022 [employer]vier/Gold Standard (2018-11-04 14:05:56)  

## 2021-05-25 ENCOUNTER — Other Ambulatory Visit: Payer: Self-pay | Admitting: Internal Medicine

## 2021-05-25 ENCOUNTER — Telehealth: Payer: Self-pay | Admitting: Internal Medicine

## 2021-05-25 MED ORDER — HYDRALAZINE HCL 100 MG PO TABS
100.0000 mg | ORAL_TABLET | Freq: Three times a day (TID) | ORAL | 0 refills | Status: DC
Start: 2021-05-25 — End: 2021-06-02

## 2021-05-25 NOTE — Telephone Encounter (Signed)
Per office policy sent 30 day to local pharmacy until appt.../lmb  

## 2021-05-25 NOTE — Telephone Encounter (Signed)
Patient requesting short fill of hydrALAZINE (APRESOLINE) 100 MG tablet  Patient states she is out of medication  Patient's next appt date is 06-02-2021

## 2021-06-02 ENCOUNTER — Other Ambulatory Visit: Payer: Self-pay

## 2021-06-02 ENCOUNTER — Encounter: Payer: Self-pay | Admitting: Internal Medicine

## 2021-06-02 ENCOUNTER — Ambulatory Visit: Payer: 59 | Admitting: Internal Medicine

## 2021-06-02 DIAGNOSIS — Z170 Estrogen receptor positive status [ER+]: Secondary | ICD-10-CM

## 2021-06-02 DIAGNOSIS — R609 Edema, unspecified: Secondary | ICD-10-CM | POA: Diagnosis not present

## 2021-06-02 DIAGNOSIS — I10 Essential (primary) hypertension: Secondary | ICD-10-CM

## 2021-06-02 DIAGNOSIS — C50411 Malignant neoplasm of upper-outer quadrant of right female breast: Secondary | ICD-10-CM

## 2021-06-02 DIAGNOSIS — D509 Iron deficiency anemia, unspecified: Secondary | ICD-10-CM | POA: Diagnosis not present

## 2021-06-02 DIAGNOSIS — M65311 Trigger thumb, right thumb: Secondary | ICD-10-CM

## 2021-06-02 LAB — CBC WITH DIFFERENTIAL/PLATELET
Basophils Absolute: 0.1 10*3/uL (ref 0.0–0.1)
Basophils Relative: 0.8 % (ref 0.0–3.0)
Eosinophils Absolute: 0.1 10*3/uL (ref 0.0–0.7)
Eosinophils Relative: 1.5 % (ref 0.0–5.0)
HCT: 37.1 % (ref 36.0–46.0)
Hemoglobin: 11.8 g/dL — ABNORMAL LOW (ref 12.0–15.0)
Lymphocytes Relative: 22.4 % (ref 12.0–46.0)
Lymphs Abs: 2.2 10*3/uL (ref 0.7–4.0)
MCHC: 31.7 g/dL (ref 30.0–36.0)
MCV: 91 fl (ref 78.0–100.0)
Monocytes Absolute: 0.7 10*3/uL (ref 0.1–1.0)
Monocytes Relative: 7.1 % (ref 3.0–12.0)
Neutro Abs: 6.6 10*3/uL (ref 1.4–7.7)
Neutrophils Relative %: 68.2 % (ref 43.0–77.0)
Platelets: 250 10*3/uL (ref 150.0–400.0)
RBC: 4.07 Mil/uL (ref 3.87–5.11)
RDW: 14.8 % (ref 11.5–15.5)
WBC: 9.7 10*3/uL (ref 4.0–10.5)

## 2021-06-02 LAB — LIPID PANEL
Cholesterol: 188 mg/dL (ref 0–200)
HDL: 42.8 mg/dL (ref >39.00)
LDL Cholesterol: 121 mg/dL — ABNORMAL HIGH (ref 0–99)
NonHDL: 144.87
Total CHOL/HDL Ratio: 4
Triglycerides: 121 mg/dL (ref 0.0–149.0)
VLDL: 24.2 mg/dL (ref 0.0–40.0)

## 2021-06-02 LAB — URINALYSIS, ROUTINE W REFLEX MICROSCOPIC
Bilirubin Urine: NEGATIVE
Hgb urine dipstick: NEGATIVE
Ketones, ur: NEGATIVE
Nitrite: NEGATIVE
RBC / HPF: NONE SEEN (ref 0–?)
Specific Gravity, Urine: 1.015 (ref 1.000–1.030)
Total Protein, Urine: NEGATIVE
Urine Glucose: NEGATIVE
Urobilinogen, UA: 0.2 (ref 0.0–1.0)
pH: 7.5 (ref 5.0–8.0)

## 2021-06-02 LAB — TSH: TSH: 1.44 u[IU]/mL (ref 0.35–5.50)

## 2021-06-02 LAB — COMPREHENSIVE METABOLIC PANEL
ALT: 23 U/L (ref 0–35)
AST: 21 U/L (ref 0–37)
Albumin: 4.1 g/dL (ref 3.5–5.2)
Alkaline Phosphatase: 104 U/L (ref 39–117)
BUN: 15 mg/dL (ref 6–23)
CO2: 31 mEq/L (ref 19–32)
Calcium: 9.4 mg/dL (ref 8.4–10.5)
Chloride: 105 mEq/L (ref 96–112)
Creatinine, Ser: 1.09 mg/dL (ref 0.40–1.20)
GFR: 57.71 mL/min — ABNORMAL LOW (ref >60.00)
Glucose, Bld: 85 mg/dL (ref 70–99)
Potassium: 3.6 mEq/L (ref 3.5–5.1)
Sodium: 141 mEq/L (ref 135–145)
Total Bilirubin: 0.6 mg/dL (ref 0.2–1.2)
Total Protein: 7.6 g/dL (ref 6.0–8.3)

## 2021-06-02 MED ORDER — HYDRALAZINE HCL 100 MG PO TABS: 100.0000 mg | ORAL_TABLET | Freq: Three times a day (TID) | ORAL | 3 refills | Status: DC

## 2021-06-02 MED ORDER — HYDROCHLOROTHIAZIDE 25 MG PO TABS: 25.0000 mg | ORAL_TABLET | Freq: Every day | ORAL | 3 refills | Status: DC

## 2021-06-02 MED ORDER — LABETALOL HCL 300 MG PO TABS: 600.0000 mg | ORAL_TABLET | Freq: Two times a day (BID) | ORAL | 3 refills | Status: DC

## 2021-06-02 NOTE — Progress Notes (Signed)
Subjective:  Patient ID: Jasmine Stafford, female    DOB: 08-03-1965  Age: 55 y.o. MRN: [id number]  CC: Hypertension (Pt states she stop taking the maxide because when she took it med makes her tongue and lips numb)   HPI Andera Cranmer presents for HTN, reaction to Maxzide - (lips would get numb).Not taking Maxzide. HCTZ was ok. F/u breast cancer, anemia   Outpatient Medications Prior to Visit  Medication Sig Dispense Refill   anastrozole (ARIMIDEX) 1 MG tablet Take 1 tablet (1 mg total) by mouth daily. 90 tablet 3   Ascorbic Acid (VITAMIN C) 1000 MG tablet Take 3,000 mg by mouth daily.      Calcium Carb-Cholecalciferol 600-200 MG-UNIT TABS Take 1 tablet by mouth daily.     Cholecalciferol (VITAMIN D3) 10000 UNITS capsule Take 10,000 Units by mouth daily.     Coenzyme Q10 (COQ10) 400 MG CAPS Take 400 mg by mouth daily.     DANDELION PO Take 2 capsules by mouth daily.      Digestive Enzymes CAPS Take 2 capsules by mouth 3 (three) times daily with meals.     ferrous gluconate (FERGON) 325 MG tablet Take 1 tablet (325 mg total) by mouth 3 (three) times daily with meals. 30 tablet 0   GARLIC PO Take 3 capsules by mouth daily.     ipratropium-albuterol (DUONEB) 0.5-2.5 (3) MG/3ML SOLN Take 3 mLs by nebulization 2 (two) times daily. (Patient taking differently: Take 3 mLs by nebulization daily as needed (wheezing).) 360 mL 0   Lactobacillus (PROBIOTIC ACIDOPHILUS PO) Take 2 tablets by mouth daily.     Melatonin 10 MG TABS Take 20 mg by mouth at bedtime as needed (sleep).      Multiple Vitamin (MULTIVITAMIN WITH MINERALS) TABS tablet Take 1 tablet by mouth at bedtime.     Multiple Vitamins-Iron (CHLORELLA PO) Take 4 g by mouth daily.     Turmeric POWD Take 7.5 mLs by mouth at bedtime.      hydrALAZINE (APRESOLINE) 100 MG tablet Take 1 tablet (100 mg total) by mouth 3 (three) times daily. 90 tablet 0   labetalol (NORMODYNE) 300 MG tablet TAKE 2 TABLETS (600 MG TOTAL) BY MOUTH 2  (TWO) TIMES DAILY. 120 tablet 5   No facility-administered medications prior to visit.    ROS: Review of Systems  Constitutional:  Positive for fatigue. Negative for activity change, appetite change, chills and unexpected weight change.  HENT:  Negative for congestion, mouth sores and sinus pressure.   Eyes:  Negative for visual disturbance.  Respiratory:  Negative for cough and chest tightness.   Cardiovascular:  Positive for leg swelling.  Gastrointestinal:  Negative for abdominal pain and nausea.  Genitourinary:  Negative for difficulty urinating, frequency and vaginal pain.  Musculoskeletal:  Positive for arthralgias. Negative for back pain and gait problem.  Skin:  Negative for pallor and rash.  Neurological:  Negative for dizziness, tremors, weakness, numbness and headaches.  Psychiatric/Behavioral:  Negative for confusion and sleep disturbance.    Objective:  BP (!) 192/88 (BP Location: Left Arm)   Pulse (!) 59   Temp 98.4 F (36.9 C) (Oral)   Ht 5\' 4"  (1.626 m)   Wt (!) 302 lb 3.2 oz (137.1 kg)   SpO2 97%   BMI 51.87 kg/m   BP Readings from Last 3 Encounters:  06/02/21 (!) 192/88  05/20/21 (!) 174/81  04/22/21 (!) 229/90    Wt Readings from Last 3 Encounters:  06/02/21 Marland Kitchen)  302 lb 3.2 oz (137.1 kg)  12/23/20 296 lb 12.8 oz (134.6 kg)  06/06/20 285 lb (129.3 kg)    Physical Exam Constitutional:      General: She is not in acute distress.    Appearance: She is well-developed. She is obese.  HENT:     Head: Normocephalic.     Right Ear: External ear normal.     Left Ear: External ear normal.     Nose: Nose normal.  Eyes:     General:        Right eye: No discharge.        Left eye: No discharge.     Conjunctiva/sclera: Conjunctivae normal.     Pupils: Pupils are equal, round, and reactive to light.  Neck:     Thyroid: No thyromegaly.     Vascular: No JVD.     Trachea: No tracheal deviation.  Cardiovascular:     Rate and Rhythm: Normal rate and regular  rhythm.     Heart sounds: Normal heart sounds.  Pulmonary:     Effort: No respiratory distress.     Breath sounds: No stridor. No wheezing.  Abdominal:     General: Bowel sounds are normal. There is no distension.     Palpations: Abdomen is soft. There is no mass.     Tenderness: There is no abdominal tenderness. There is no guarding or rebound.  Musculoskeletal:        General: No tenderness.     Cervical back: Normal range of motion and neck supple. No rigidity.  Lymphadenopathy:     Cervical: No cervical adenopathy.  Skin:    Findings: No erythema or rash.  Neurological:     Cranial Nerves: No cranial nerve deficit.     Motor: No abnormal muscle tone.     Coordination: Coordination normal.     Deep Tendon Reflexes: Reflexes normal.  Psychiatric:        Behavior: Behavior normal.        Thought Content: Thought content normal.        Judgment: Judgment normal.  R trigger thumb Trace edema B feet  Lab Results  Component Value Date   WBC 8.7 01/12/2020   HGB 12.1 01/12/2020   HCT 36.5 01/12/2020   PLT 237.0 01/12/2020   GLUCOSE 96 01/12/2020   CHOL 205 (H) 11/22/2018   TRIG 68 11/22/2018   HDL 55 11/22/2018   LDLCALC 136 (H) 11/22/2018   ALT 25 01/12/2020   AST 30 01/12/2020   NA 142 01/12/2020   K 3.6 01/12/2020   CL 104 01/12/2020   CREATININE 1.11 01/12/2020   BUN 17 01/12/2020   CO2 30 01/12/2020   TSH 1.44 01/12/2020    US BREAST LTD UNI RIGHT INC AXILLA  Result Date: 02/07/2021 CLINICAL DATA:  Diagnostic follow-up. Patient was diagnosed with invasive lobular carcinoma in 2014, but declined surgically. She has been managed with anastrozole and Zoladex. Patient underwent breast MRI on 07/09/2020 which demonstrated a mild decrease in non mass enhancement in the upper outer right breast the known site of malignancy, showing no evidence of disease progression. EXAM: DIGITAL DIAGNOSTIC BILATERAL MAMMOGRAM WITH TOMOSYNTHESIS AND CAD; ULTRASOUND RIGHT BREAST LIMITED  TECHNIQUE: Bilateral digital diagnostic mammography and breast tomosynthesis was performed. The images were evaluated with computer-aided detection.; Targeted ultrasound examination of the right breast was performed COMPARISON:  Previous exam(s). ACR Breast Density Category c: The breast tissue is heterogeneously dense, which may obscure small masses. FINDINGS: The focal  opacity with subtle distortion in the upper outer right breast, associated with the ribbon shaped biopsy clip, is unchanged. There are no new masses or areas of new asymmetry. No new areas of architectural distortion and no suspicious calcifications. Targeted right breast ultrasound is performed, showing subtle areas of distortion and shadowing and relative hypoechogenicity in the right breast at 10 o'clock, 9 cm the nipple, which may reflect 2 adjacent areas, spanning a total of 2.4 cm in long axis, the larger area of a measuring 1.2 x 1.0 cm radially. This is without significant change from the prior exam allowing for differences in sonographic measurement technique. IMPRESSION: 1. No evidence to suggest progression of the known invasive lobular carcinoma of the right breast. 2. No evidence of new malignancy. RECOMMENDATION: Diagnostic mammography with possible right breast ultrasound in 1 year. I have discussed the findings and recommendations with the patient. If applicable, a reminder letter will be sent to the patient regarding the next appointment. BI-RADS CATEGORY  6: Known biopsy-proven malignancy. Electronically Signed   By: Lajean Manes M.D.   On: 02/07/2021 16:21  MM DIAG BREAST TOMO BILATERAL  Result Date: 02/07/2021 CLINICAL DATA:  Diagnostic follow-up. Patient was diagnosed with invasive lobular carcinoma in 2014, but declined surgically. She has been managed with anastrozole and Zoladex. Patient underwent breast MRI on 07/09/2020 which demonstrated a mild decrease in non mass enhancement in the upper outer right breast the known  site of malignancy, showing no evidence of disease progression. EXAM: DIGITAL DIAGNOSTIC BILATERAL MAMMOGRAM WITH TOMOSYNTHESIS AND CAD; ULTRASOUND RIGHT BREAST LIMITED TECHNIQUE: Bilateral digital diagnostic mammography and breast tomosynthesis was performed. The images were evaluated with computer-aided detection.; Targeted ultrasound examination of the right breast was performed COMPARISON:  Previous exam(s). ACR Breast Density Category c: The breast tissue is heterogeneously dense, which may obscure small masses. FINDINGS: The focal opacity with subtle distortion in the upper outer right breast, associated with the ribbon shaped biopsy clip, is unchanged. There are no new masses or areas of new asymmetry. No new areas of architectural distortion and no suspicious calcifications. Targeted right breast ultrasound is performed, showing subtle areas of distortion and shadowing and relative hypoechogenicity in the right breast at 10 o'clock, 9 cm the nipple, which may reflect 2 adjacent areas, spanning a total of 2.4 cm in long axis, the larger area of a measuring 1.2 x 1.0 cm radially. This is without significant change from the prior exam allowing for differences in sonographic measurement technique. IMPRESSION: 1. No evidence to suggest progression of the known invasive lobular carcinoma of the right breast. 2. No evidence of new malignancy. RECOMMENDATION: Diagnostic mammography with possible right breast ultrasound in 1 year. I have discussed the findings and recommendations with the patient. If applicable, a reminder letter will be sent to the patient regarding the next appointment. BI-RADS CATEGORY  6: Known biopsy-proven malignancy. Electronically Signed   By: Lajean Manes M.D.   On: 02/07/2021 16:21   Assessment & Plan:   Problem List Items Addressed This Visit     Cancer of upper-outer quadrant of female breast ([place])    Dr Lindi Adie Right [facility], ER/PR+, Her2neu- On Arimidex Zolodex q 1 mo        Edema    Recent reaction to Maxzide - (lips would get numb).Not taking Maxzide. HCTZ was ok. Re-start HCTZ      Relevant Orders   TSH   Urinalysis   CBC with Differential/Platelet   Lipid panel   Comprehensive metabolic panel  Essential hypertension    Worse Re-start meds Recent reaction to Maxzide - (lips would get numb).Not taking Maxzide. HCTZ was ok. Re-start HCTZ      Relevant Medications   hydrALAZINE (APRESOLINE) 100 MG tablet   labetalol (NORMODYNE) 300 MG tablet   hydrochlorothiazide (HYDRODIURIL) 25 MG tablet   Other Relevant Orders   TSH   Urinalysis   CBC with Differential/Platelet   Lipid panel   Comprehensive metabolic panel   Iron deficiency anemia    CBC       Relevant Orders   TSH   Urinalysis   CBC with Differential/Platelet   Lipid panel   Comprehensive metabolic panel   Trigger thumb of right hand    Trigger thumb splint Voltaren gel         Meds ordered this encounter  Medications   hydrALAZINE (APRESOLINE) 100 MG tablet    Sig: Take 1 tablet (100 mg total) by mouth 3 (three) times daily.    Dispense:  270 tablet    Refill:  3   labetalol (NORMODYNE) 300 MG tablet    Sig: Take 2 tablets (600 mg total) by mouth 2 (two) times daily.    Dispense:  360 tablet    Refill:  3   hydrochlorothiazide (HYDRODIURIL) 25 MG tablet    Sig: Take 1 tablet (25 mg total) by mouth daily.    Dispense:  90 tablet    Refill:  3      Follow-up: Return in about 3 months (around 09/02/2021) for a follow-up visit.  Walker Kehr, MD

## 2021-06-02 NOTE — Assessment & Plan Note (Addendum)
Worse Re-start meds Recent reaction to Maxzide - (lips would get numb).Not taking Maxzide. HCTZ was ok. Re-start HCTZ

## 2021-06-02 NOTE — Assessment & Plan Note (Addendum)
Dr Lindi Adie Right [facility], ER/PR+, Her2neu- On Arimidex Zolodex q 1 mo

## 2021-06-02 NOTE — Assessment & Plan Note (Addendum)
CBC

## 2021-06-02 NOTE — Addendum Note (Signed)
Addended by: Boris Lown B on: 06/02/2021 11:40 AM   Modules accepted: Orders

## 2021-06-02 NOTE — Assessment & Plan Note (Signed)
Recent reaction to Maxzide - (lips would get numb).Not taking Maxzide. HCTZ was ok. Re-start HCTZ

## 2021-06-02 NOTE — Assessment & Plan Note (Signed)
Trigger thumb splint Voltaren gel

## 2021-06-17 ENCOUNTER — Inpatient Hospital Stay: Payer: 59 | Attending: Hematology and Oncology

## 2021-06-17 ENCOUNTER — Other Ambulatory Visit: Payer: Self-pay

## 2021-06-17 VITALS — BP 155/69 | HR 64 | Resp 18

## 2021-06-17 DIAGNOSIS — Z5111 Encounter for antineoplastic chemotherapy: Secondary | ICD-10-CM | POA: Insufficient documentation

## 2021-06-17 DIAGNOSIS — D509 Iron deficiency anemia, unspecified: Secondary | ICD-10-CM

## 2021-06-17 DIAGNOSIS — C50411 Malignant neoplasm of upper-outer quadrant of right female breast: Secondary | ICD-10-CM | POA: Diagnosis present

## 2021-06-17 MED ORDER — GOSERELIN ACETATE 3.6 MG ~~LOC~~ IMPL
3.6000 mg | DRUG_IMPLANT | Freq: Once | SUBCUTANEOUS | Status: AC
  Administered 2021-06-17: 3.6 mg via SUBCUTANEOUS
  Filled 2021-06-17: qty 3.6

## 2021-07-15 ENCOUNTER — Inpatient Hospital Stay: Payer: 59

## 2021-07-20 ENCOUNTER — Other Ambulatory Visit: Payer: Self-pay

## 2021-07-20 ENCOUNTER — Inpatient Hospital Stay: Payer: 59 | Attending: Hematology and Oncology

## 2021-07-20 VITALS — BP 150/73 | HR 61 | Temp 98.5°F | Resp 20

## 2021-07-20 DIAGNOSIS — C50411 Malignant neoplasm of upper-outer quadrant of right female breast: Secondary | ICD-10-CM | POA: Insufficient documentation

## 2021-07-20 DIAGNOSIS — Z5111 Encounter for antineoplastic chemotherapy: Secondary | ICD-10-CM | POA: Insufficient documentation

## 2021-07-20 DIAGNOSIS — D509 Iron deficiency anemia, unspecified: Secondary | ICD-10-CM

## 2021-07-20 MED ORDER — GOSERELIN ACETATE 3.6 MG ~~LOC~~ IMPL
3.6000 mg | DRUG_IMPLANT | Freq: Once | SUBCUTANEOUS | Status: AC
  Administered 2021-07-20: 14:00:00 3.6 mg via SUBCUTANEOUS
  Filled 2021-07-20: qty 3.6

## 2021-07-20 NOTE — Patient Instructions (Signed)
Goserelin injection °What is this medication? °GOSERELIN (GOE se rel in) is similar to a hormone found in the body. It lowers the amount of sex hormones that the body makes. Men will have lower testosterone levels and women will have lower estrogen levels while taking this medicine. In men, this medicine is used to treat prostate cancer; the injection is either given once per month or once every 12 weeks. A once per month injection (only) is used to treat women with endometriosis, dysfunctional uterine bleeding, or advanced breast cancer. °This medicine may be used for other purposes; ask your health care provider or pharmacist if you have questions. °COMMON BRAND NAME(S): Zoladex, Zoladex 3-Month °What should I tell my care team before I take this medication? °They need to know if you have any of these conditions: °bone problems °diabetes °heart disease °history of irregular heartbeat °an unusual or allergic reaction to goserelin, other medicines, foods, dyes, or preservatives °pregnant or trying to get pregnant °breast-feeding °How should I use this medication? °This medicine is for injection under the skin. It is given by a health care professional in a hospital or clinic setting. °Talk to your pediatrician regarding the use of this medicine in children. Special care may be needed. °Overdosage: If you think you have taken too much of this medicine contact a poison control center or emergency room at once. °NOTE: This medicine is only for you. Do not share this medicine with others. °What if I miss a dose? °It is important not to miss your dose. Call your doctor or health care professional if you are unable to keep an appointment. °What may interact with this medication? °Do not take this medicine with any of the following medications: °cisapride °dronedarone °pimozide °thioridazine °This medicine may also interact with the following medications: °other medicines that prolong the QT interval (an abnormal heart  rhythm) °This list may not describe all possible interactions. Give your health care provider a list of all the medicines, herbs, non-prescription drugs, or dietary supplements you use. Also tell them if you smoke, drink alcohol, or use illegal drugs. Some items may interact with your medicine. °What should I watch for while using this medication? °Visit your doctor or health care provider for regular checks on your progress. Your symptoms may appear to get worse during the first weeks of this therapy. Tell your doctor or healthcare provider if your symptoms do not start to get better or if they get worse after this time. °Your bones may get weaker if you take this medicine for a long time. If you smoke or frequently drink alcohol you may increase your risk of bone loss. A family history of osteoporosis, chronic use of drugs for seizures (convulsions), or corticosteroids can also increase your risk of bone loss. Talk to your doctor about how to keep your bones strong. °This medicine should stop regular monthly menstruation in women. Tell your doctor if you continue to menstruate. °Women should not become pregnant while taking this medicine or for 12 weeks after stopping this medicine. Women should inform their doctor if they wish to become pregnant or think they might be pregnant. There is a potential for serious side effects to an unborn child. Talk to your health care professional or pharmacist for more information. Do not breast-feed an infant while taking this medicine. °Men should inform their doctors if they wish to father a child. This medicine may lower sperm counts. Talk to your health care professional or pharmacist for more information. °This   medicine may increase blood sugar. Ask your healthcare provider if changes in diet or medicines are needed if you have diabetes. °What side effects may I notice from receiving this medication? °Side effects that you should report to your doctor or health care  professional as soon as possible: °allergic reactions like skin rash, itching or hives, swelling of the face, lips, or tongue °bone pain °breathing problems °changes in vision °chest pain °feeling faint or lightheaded, falls °fever, chills °pain, swelling, warmth in the leg °pain, tingling, numbness in the hands or feet °signs and symptoms of high blood sugar such as being more thirsty or hungry or having to urinate more than normal. You may also feel very tired or have blurry vision °signs and symptoms of low blood pressure like dizziness; feeling faint or lightheaded, falls; unusually weak or tired °stomach pain °swelling of the ankles, feet, hands °trouble passing urine or change in the amount of urine °unusually high or low blood pressure °unusually weak or tired °Side effects that usually do not require medical attention (report to your doctor or health care professional if they continue or are bothersome): °change in sex drive or performance °changes in breast size in both males and females °changes in emotions or moods °headache °hot flashes °irritation at site where injected °loss of appetite °skin problems like acne, dry skin °vaginal dryness °This list may not describe all possible side effects. Call your doctor for medical advice about side effects. You may report side effects to FDA at 1-800-FDA-1088. °Where should I keep my medication? °This drug is given in a hospital or clinic and will not be stored at home. °NOTE: This sheet is a summary. It may not cover all possible information. If you have questions about this medicine, talk to your doctor, pharmacist, or health care provider. °© 2022 [employer] (2018-11-15 00:00:00) ° °

## 2021-08-12 ENCOUNTER — Inpatient Hospital Stay: Payer: 59 | Attending: Hematology and Oncology

## 2021-08-12 ENCOUNTER — Other Ambulatory Visit: Payer: Self-pay

## 2021-08-12 VITALS — BP 156/83 | HR 62 | Temp 98.8°F | Resp 18

## 2021-08-12 DIAGNOSIS — D509 Iron deficiency anemia, unspecified: Secondary | ICD-10-CM

## 2021-08-12 DIAGNOSIS — Z5111 Encounter for antineoplastic chemotherapy: Secondary | ICD-10-CM | POA: Diagnosis present

## 2021-08-12 DIAGNOSIS — C50411 Malignant neoplasm of upper-outer quadrant of right female breast: Secondary | ICD-10-CM | POA: Insufficient documentation

## 2021-08-12 MED ORDER — GOSERELIN ACETATE 3.6 MG ~~LOC~~ IMPL
3.6000 mg | DRUG_IMPLANT | Freq: Once | SUBCUTANEOUS | Status: AC
  Administered 2021-08-12: 3.6 mg via SUBCUTANEOUS
  Filled 2021-08-12: qty 3.6

## 2021-08-12 NOTE — Patient Instructions (Signed)
Goserelin injection °What is this medication? °GOSERELIN (GOE se rel in) is similar to a hormone found in the body. It lowers the amount of sex hormones that the body makes. Men will have lower testosterone levels and women will have lower estrogen levels while taking this medicine. In men, this medicine is used to treat prostate cancer; the injection is either given once per month or once every 12 weeks. A once per month injection (only) is used to treat women with endometriosis, dysfunctional uterine bleeding, or advanced breast cancer. °This medicine may be used for other purposes; ask your health care provider or pharmacist if you have questions. °COMMON BRAND NAME(S): Zoladex, Zoladex 3-Month °What should I tell my care team before I take this medication? °They need to know if you have any of these conditions: °bone problems °diabetes °heart disease °history of irregular heartbeat °an unusual or allergic reaction to goserelin, other medicines, foods, dyes, or preservatives °pregnant or trying to get pregnant °breast-feeding °How should I use this medication? °This medicine is for injection under the skin. It is given by a health care professional in a hospital or clinic setting. °Talk to your pediatrician regarding the use of this medicine in children. Special care may be needed. °Overdosage: If you think you have taken too much of this medicine contact a poison control center or emergency room at once. °NOTE: This medicine is only for you. Do not share this medicine with others. °What if I miss a dose? °It is important not to miss your dose. Call your doctor or health care professional if you are unable to keep an appointment. °What may interact with this medication? °Do not take this medicine with any of the following medications: °cisapride °dronedarone °pimozide °thioridazine °This medicine may also interact with the following medications: °other medicines that prolong the QT interval (an abnormal heart  rhythm) °This list may not describe all possible interactions. Give your health care provider a list of all the medicines, herbs, non-prescription drugs, or dietary supplements you use. Also tell them if you smoke, drink alcohol, or use illegal drugs. Some items may interact with your medicine. °What should I watch for while using this medication? °Visit your doctor or health care provider for regular checks on your progress. Your symptoms may appear to get worse during the first weeks of this therapy. Tell your doctor or healthcare provider if your symptoms do not start to get better or if they get worse after this time. °Your bones may get weaker if you take this medicine for a long time. If you smoke or frequently drink alcohol you may increase your risk of bone loss. A family history of osteoporosis, chronic use of drugs for seizures (convulsions), or corticosteroids can also increase your risk of bone loss. Talk to your doctor about how to keep your bones strong. °This medicine should stop regular monthly menstruation in women. Tell your doctor if you continue to menstruate. °Women should not become pregnant while taking this medicine or for 12 weeks after stopping this medicine. Women should inform their doctor if they wish to become pregnant or think they might be pregnant. There is a potential for serious side effects to an unborn child. Talk to your health care professional or pharmacist for more information. Do not breast-feed an infant while taking this medicine. °Men should inform their doctors if they wish to father a child. This medicine may lower sperm counts. Talk to your health care professional or pharmacist for more information. °This   medicine may increase blood sugar. Ask your healthcare provider if changes in diet or medicines are needed if you have diabetes. °What side effects may I notice from receiving this medication? °Side effects that you should report to your doctor or health care  professional as soon as possible: °allergic reactions like skin rash, itching or hives, swelling of the face, lips, or tongue °bone pain °breathing problems °changes in vision °chest pain °feeling faint or lightheaded, falls °fever, chills °pain, swelling, warmth in the leg °pain, tingling, numbness in the hands or feet °signs and symptoms of high blood sugar such as being more thirsty or hungry or having to urinate more than normal. You may also feel very tired or have blurry vision °signs and symptoms of low blood pressure like dizziness; feeling faint or lightheaded, falls; unusually weak or tired °stomach pain °swelling of the ankles, feet, hands °trouble passing urine or change in the amount of urine °unusually high or low blood pressure °unusually weak or tired °Side effects that usually do not require medical attention (report to your doctor or health care professional if they continue or are bothersome): °change in sex drive or performance °changes in breast size in both males and females °changes in emotions or moods °headache °hot flashes °irritation at site where injected °loss of appetite °skin problems like acne, dry skin °vaginal dryness °This list may not describe all possible side effects. Call your doctor for medical advice about side effects. You may report side effects to FDA at 1-800-FDA-1088. °Where should I keep my medication? °This drug is given in a hospital or clinic and will not be stored at home. °NOTE: This sheet is a summary. It may not cover all possible information. If you have questions about this medicine, talk to your doctor, pharmacist, or health care provider. °© 2022 [employer] (2018-11-15 00:00:00) ° °

## 2021-09-12 ENCOUNTER — Telehealth: Payer: Self-pay | Admitting: *Deleted

## 2021-09-12 NOTE — Telephone Encounter (Signed)
Received call from pt requesting continuous FMLA related to hypertension and stress.  RN educated pt that we can not provide the FMLA paperwork and that she would need to contact her PCP.  Pt verbalized understanding.

## 2021-09-16 ENCOUNTER — Inpatient Hospital Stay: Payer: 59 | Attending: Hematology and Oncology

## 2021-09-16 ENCOUNTER — Ambulatory Visit: Payer: 59 | Admitting: Internal Medicine

## 2021-09-16 ENCOUNTER — Encounter: Payer: Self-pay | Admitting: Internal Medicine

## 2021-09-16 ENCOUNTER — Other Ambulatory Visit: Payer: Self-pay

## 2021-09-16 DIAGNOSIS — C50411 Malignant neoplasm of upper-outer quadrant of right female breast: Secondary | ICD-10-CM | POA: Insufficient documentation

## 2021-09-16 DIAGNOSIS — I10 Essential (primary) hypertension: Secondary | ICD-10-CM | POA: Diagnosis not present

## 2021-09-16 DIAGNOSIS — Z5111 Encounter for antineoplastic chemotherapy: Secondary | ICD-10-CM | POA: Insufficient documentation

## 2021-09-16 DIAGNOSIS — R5383 Other fatigue: Secondary | ICD-10-CM

## 2021-09-16 DIAGNOSIS — E669 Obesity, unspecified: Secondary | ICD-10-CM

## 2021-09-16 DIAGNOSIS — D509 Iron deficiency anemia, unspecified: Secondary | ICD-10-CM

## 2021-09-16 DIAGNOSIS — Z170 Estrogen receptor positive status [ER+]: Secondary | ICD-10-CM

## 2021-09-16 MED ORDER — GOSERELIN ACETATE 3.6 MG ~~LOC~~ IMPL
3.6000 mg | DRUG_IMPLANT | Freq: Once | SUBCUTANEOUS | Status: AC
  Administered 2021-09-16: 3.6 mg via SUBCUTANEOUS
  Filled 2021-09-16: qty 3.6

## 2021-09-16 MED ORDER — OLMESARTAN MEDOXOMIL 20 MG PO TABS: 20.0000 mg | ORAL_TABLET | Freq: Every day | ORAL | 3 refills | Status: DC

## 2021-09-16 NOTE — Assessment & Plan Note (Addendum)
Not well controlled Cont on HCTZ, Hydralazine, Labetalol. Added Olmesartan FMLA filled out and signed

## 2021-09-16 NOTE — Progress Notes (Signed)
Subjective:  Patient ID: Jasmine Stafford, female    DOB: [birth year]  Age: 56 y.o. MRN: [id number]  CC: No chief complaint on file.   HPI Jasmine Stafford presents for elevated BP up to SBP of 200 at home F/u fatigue due to cancer treatment F/u asthma  Outpatient Medications Prior to Visit  Medication Sig Dispense Refill   anastrozole (ARIMIDEX) 1 MG tablet Take 1 tablet (1 mg total) by mouth daily. 90 tablet 3   Ascorbic Acid (VITAMIN C) 1000 MG tablet Take 3,000 mg by mouth daily.      Calcium Carb-Cholecalciferol 600-200 MG-UNIT TABS Take 1 tablet by mouth daily.     Cholecalciferol (VITAMIN D3) 10000 UNITS capsule Take 10,000 Units by mouth daily.     Coenzyme Q10 (COQ10) 400 MG CAPS Take 400 mg by mouth daily.     DANDELION PO Take 2 capsules by mouth daily.      Digestive Enzymes CAPS Take 2 capsules by mouth 3 (three) times daily with meals.     ferrous gluconate (FERGON) 325 MG tablet Take 1 tablet (325 mg total) by mouth 3 (three) times daily with meals. 30 tablet 0   GARLIC PO Take 3 capsules by mouth daily.     hydrALAZINE (APRESOLINE) 100 MG tablet Take 1 tablet (100 mg total) by mouth 3 (three) times daily. 270 tablet 3   hydrochlorothiazide (HYDRODIURIL) 25 MG tablet Take 1 tablet (25 mg total) by mouth daily. 90 tablet 3   ipratropium-albuterol (DUONEB) 0.5-2.5 (3) MG/3ML SOLN Take 3 mLs by nebulization 2 (two) times daily. (Patient taking differently: Take 3 mLs by nebulization daily as needed (wheezing).) 360 mL 0   labetalol (NORMODYNE) 300 MG tablet Take 2 tablets (600 mg total) by mouth 2 (two) times daily. 360 tablet 3   Lactobacillus (PROBIOTIC ACIDOPHILUS PO) Take 2 tablets by mouth daily.     Melatonin 10 MG TABS Take 20 mg by mouth at bedtime as needed (sleep).      Multiple Vitamin (MULTIVITAMIN WITH MINERALS) TABS tablet Take 1 tablet by mouth at bedtime.     Multiple Vitamins-Iron (CHLORELLA PO) Take 4 g by mouth daily.     Turmeric POWD Take 7.5  mLs by mouth at bedtime.      No facility-administered medications prior to visit.    ROS: Review of Systems  Constitutional:  Positive for fatigue and unexpected weight change. Negative for activity change, appetite change and chills.  HENT:  Negative for congestion, mouth sores and sinus pressure.   Eyes:  Negative for visual disturbance.  Respiratory:  Negative for cough and chest tightness.   Gastrointestinal:  Negative for abdominal pain and nausea.  Genitourinary:  Negative for difficulty urinating, frequency and vaginal pain.  Musculoskeletal:  Negative for back pain and gait problem.  Skin:  Negative for pallor and rash.  Neurological:  Negative for dizziness, tremors, weakness, numbness and headaches.  Psychiatric/Behavioral:  Negative for confusion, sleep disturbance and suicidal ideas.    Objective:  BP (!) 170/98 (BP Location: Left Arm, Patient Position: Sitting, Cuff Size: Large)    Pulse 65    Temp 99.9 F (37.7 C) (Oral)    Ht 5\' 4"  (1.626 m)    Wt 298 lb (135.2 kg)    SpO2 92%    BMI 51.15 kg/m   BP Readings from Last 3 Encounters:  09/16/21 (!) 170/98  08/12/21 (!) 156/83  07/20/21 (!) 150/73    Wt Readings from Last 3 Encounters:  09/16/21 298 lb (135.2 kg)  06/02/21 (!) 302 lb 3.2 oz (137.1 kg)  12/23/20 296 lb 12.8 oz (134.6 kg)    Physical Exam Constitutional:      General: She is not in acute distress.    Appearance: She is well-developed. She is obese.  HENT:     Head: Normocephalic.     Right Ear: External ear normal.     Left Ear: External ear normal.     Nose: Nose normal.  Eyes:     General:        Right eye: No discharge.        Left eye: No discharge.     Conjunctiva/sclera: Conjunctivae normal.     Pupils: Pupils are equal, round, and reactive to light.  Neck:     Thyroid: No thyromegaly.     Vascular: No JVD.     Trachea: No tracheal deviation.  Cardiovascular:     Rate and Rhythm: Normal rate and regular rhythm.     Heart sounds:  Normal heart sounds.  Pulmonary:     Effort: No respiratory distress.     Breath sounds: No stridor. No wheezing.  Abdominal:     General: Bowel sounds are normal. There is no distension.     Palpations: Abdomen is soft. There is no mass.     Tenderness: There is no abdominal tenderness. There is no guarding or rebound.  Musculoskeletal:        General: No tenderness.     Cervical back: Normal range of motion and neck supple. No rigidity.  Lymphadenopathy:     Cervical: No cervical adenopathy.  Skin:    Findings: No erythema or rash.  Neurological:     Cranial Nerves: No cranial nerve deficit.     Motor: No abnormal muscle tone.     Coordination: Coordination normal.     Deep Tendon Reflexes: Reflexes normal.  Psychiatric:        Behavior: Behavior normal.        Thought Content: Thought content normal.        Judgment: Judgment normal.    Lab Results  Component Value Date   WBC 9.7 06/02/2021   HGB 11.8 (L) 06/02/2021   HCT 37.1 06/02/2021   PLT 250.0 06/02/2021   GLUCOSE 85 06/02/2021   CHOL 188 06/02/2021   TRIG 121.0 06/02/2021   HDL 42.80 06/02/2021   LDLCALC 121 (H) 06/02/2021   ALT 23 06/02/2021   AST 21 06/02/2021   NA 141 06/02/2021   K 3.6 06/02/2021   CL 105 06/02/2021   CREATININE 1.09 06/02/2021   BUN 15 06/02/2021   CO2 31 06/02/2021   TSH 1.44 06/02/2021    US BREAST LTD UNI RIGHT INC AXILLA  Result Date: 02/07/2021 CLINICAL DATA:  Diagnostic follow-up. Patient was diagnosed with invasive lobular carcinoma in 2014, but declined surgically. She has been managed with anastrozole and Zoladex. Patient underwent breast MRI on 07/09/2020 which demonstrated a mild decrease in non mass enhancement in the upper outer right breast the known site of malignancy, showing no evidence of disease progression. EXAM: DIGITAL DIAGNOSTIC BILATERAL MAMMOGRAM WITH TOMOSYNTHESIS AND CAD; ULTRASOUND RIGHT BREAST LIMITED TECHNIQUE: Bilateral digital diagnostic mammography and  breast tomosynthesis was performed. The images were evaluated with computer-aided detection.; Targeted ultrasound examination of the right breast was performed COMPARISON:  Previous exam(s). ACR Breast Density Category c: The breast tissue is heterogeneously dense, which may obscure small masses. FINDINGS: The focal opacity with subtle distortion  in the upper outer right breast, associated with the ribbon shaped biopsy clip, is unchanged. There are no new masses or areas of new asymmetry. No new areas of architectural distortion and no suspicious calcifications. Targeted right breast ultrasound is performed, showing subtle areas of distortion and shadowing and relative hypoechogenicity in the right breast at 10 o'clock, 9 cm the nipple, which may reflect 2 adjacent areas, spanning a total of 2.4 cm in long axis, the larger area of a measuring 1.2 x 1.0 cm radially. This is without significant change from the prior exam allowing for differences in sonographic measurement technique. IMPRESSION: 1. No evidence to suggest progression of the known invasive lobular carcinoma of the right breast. 2. No evidence of new malignancy. RECOMMENDATION: Diagnostic mammography with possible right breast ultrasound in 1 year. I have discussed the findings and recommendations with the patient. If applicable, a reminder letter will be sent to the patient regarding the next appointment. BI-RADS CATEGORY  6: Known biopsy-proven malignancy. Electronically Signed   By: Lajean Manes M.D.   On: 02/07/2021 16:21  MM DIAG BREAST TOMO BILATERAL  Result Date: 02/07/2021 CLINICAL DATA:  Diagnostic follow-up. Patient was diagnosed with invasive lobular carcinoma in 2014, but declined surgically. She has been managed with anastrozole and Zoladex. Patient underwent breast MRI on 07/09/2020 which demonstrated a mild decrease in non mass enhancement in the upper outer right breast the known site of malignancy, showing no evidence of disease  progression. EXAM: DIGITAL DIAGNOSTIC BILATERAL MAMMOGRAM WITH TOMOSYNTHESIS AND CAD; ULTRASOUND RIGHT BREAST LIMITED TECHNIQUE: Bilateral digital diagnostic mammography and breast tomosynthesis was performed. The images were evaluated with computer-aided detection.; Targeted ultrasound examination of the right breast was performed COMPARISON:  Previous exam(s). ACR Breast Density Category c: The breast tissue is heterogeneously dense, which may obscure small masses. FINDINGS: The focal opacity with subtle distortion in the upper outer right breast, associated with the ribbon shaped biopsy clip, is unchanged. There are no new masses or areas of new asymmetry. No new areas of architectural distortion and no suspicious calcifications. Targeted right breast ultrasound is performed, showing subtle areas of distortion and shadowing and relative hypoechogenicity in the right breast at 10 o'clock, 9 cm the nipple, which may reflect 2 adjacent areas, spanning a total of 2.4 cm in long axis, the larger area of a measuring 1.2 x 1.0 cm radially. This is without significant change from the prior exam allowing for differences in sonographic measurement technique. IMPRESSION: 1. No evidence to suggest progression of the known invasive lobular carcinoma of the right breast. 2. No evidence of new malignancy. RECOMMENDATION: Diagnostic mammography with possible right breast ultrasound in 1 year. I have discussed the findings and recommendations with the patient. If applicable, a reminder letter will be sent to the patient regarding the next appointment. BI-RADS CATEGORY  6: Known biopsy-proven malignancy. Electronically Signed   By: Lajean Manes M.D.   On: 02/07/2021 16:21   Assessment & Plan:   Problem List Items Addressed This Visit     Cancer of upper-outer quadrant of female breast ([place])    FMLA filled out and signed 2/17-5/22/23 On Arimidex Zolodex q 1 mo       Essential hypertension    Not well controlled Cont  on HCTZ, Hydralazine, Labetalol. Added Olmesartan FMLA filled out and signed       Relevant Medications   olmesartan (BENICAR) 20 MG tablet   Fatigue    Due to HTN, stress, cancer FMLA filled out and signed  2/17-5/22/23      Obesity (BMI 35.0-39.9 without comorbidity)    On diet         Meds ordered this encounter  Medications   olmesartan (BENICAR) 20 MG tablet    Sig: Take 1 tablet (20 mg total) by mouth daily.    Dispense:  90 tablet    Refill:  3      Follow-up: Return in about 3 months (around 12/14/2021) for a follow-up visit.  Walker Kehr, MD

## 2021-09-16 NOTE — Assessment & Plan Note (Addendum)
FMLA filled out and signed 2/17-5/22/23 On Arimidex Zolodex q 1 mo

## 2021-09-16 NOTE — Assessment & Plan Note (Signed)
  On diet  

## 2021-09-16 NOTE — Patient Instructions (Signed)
Sign up for [employer] [employer] ( via Libby app on your phone or your ipad). If you don't have a library card  - go to any library branch. They will set you up in 15 minutes. It is free. You can check out books to read and to listen, check out magazines and newspapers, movies etc.   The Obesity Code book by Jason Fung   These suggestions will probably help you to improve your metabolism if you are not overweight and to lose weight if you are overweight: 1.  Reduce your consumption of sugars and starches.  Eliminate high fructose corn syrup from your diet.  Reduce your consumption of processed foods.  For desserts try to have seasonal fruits, berries, nuts, cheeses or dark chocolate with more than 70% cacao. 2.  Do not snack 3.  You do not have to eat breakfast.  If you choose to have breakfast - eat plain greek yogurt, eggs, oatmeal (without sugar) - use honey if you need to. 4.  Drink water, freshly brewed unsweetened tea (green, black or herbal) or coffee.  Do not drink sodas including diet sodas , juices, beverages sweetened with artificial sweeteners. 5.  Reduce your consumption of refined grains. 6.  Avoid protein drinks such as Optifast, Slim fast etc. Eat chicken, fish, meat, dairy and beans for your sources of protein. 7.  Natural unprocessed fats like cold pressed virgin olive oil, butter, coconut oil are good for you.  Eat avocados. 8.  Increase your consumption of fiber.  Fruits, berries, vegetables, whole grains, flaxseed, chia seeds, beans, popcorn, nuts, oatmeal are good sources of fiber 9.  Use vinegar in your diet, i.e. apple cider vinegar, red wine or balsamic vinegar 10.  You can try fasting.  For example you can skip breakfast and lunch every other day (24-hour fast) 11.  Stress reduction, good night sleep, relaxation, meditation, yoga and other physical activity is likely to help you to maintain low weight too. 12.  If you drink alcohol, limit your alcohol intake to no more than  2 drinks a day.  

## 2021-09-16 NOTE — Assessment & Plan Note (Addendum)
Due to HTN, stress, cancer FMLA filled out and signed 2/17-5/22/23

## 2021-10-14 ENCOUNTER — Inpatient Hospital Stay: Payer: 59 | Attending: Hematology and Oncology

## 2021-10-14 ENCOUNTER — Other Ambulatory Visit: Payer: Self-pay

## 2021-10-14 VITALS — BP 187/74 | HR 57 | Temp 98.3°F | Resp 18

## 2021-10-14 DIAGNOSIS — D509 Iron deficiency anemia, unspecified: Secondary | ICD-10-CM

## 2021-10-14 DIAGNOSIS — C50411 Malignant neoplasm of upper-outer quadrant of right female breast: Secondary | ICD-10-CM | POA: Insufficient documentation

## 2021-10-14 DIAGNOSIS — Z5111 Encounter for antineoplastic chemotherapy: Secondary | ICD-10-CM | POA: Insufficient documentation

## 2021-10-14 MED ORDER — GOSERELIN ACETATE 3.6 MG ~~LOC~~ IMPL
3.6000 mg | DRUG_IMPLANT | Freq: Once | SUBCUTANEOUS | Status: AC
  Administered 2021-10-14: 3.6 mg via SUBCUTANEOUS
  Filled 2021-10-14: qty 3.6

## 2021-10-14 NOTE — Patient Instructions (Signed)
Goserelin injection °What is this medication? °GOSERELIN (GOE se rel in) is similar to a hormone found in the body. It lowers the amount of sex hormones that the body makes. Men will have lower testosterone levels and women will have lower estrogen levels while taking this medicine. In men, this medicine is used to treat prostate cancer; the injection is either given once per month or once every 12 weeks. A once per month injection (only) is used to treat women with endometriosis, dysfunctional uterine bleeding, or advanced breast cancer. °This medicine may be used for other purposes; ask your health care provider or pharmacist if you have questions. °COMMON BRAND NAME(S): Zoladex, Zoladex 3-Month °What should I tell my care team before I take this medication? °They need to know if you have any of these conditions: °bone problems °diabetes °heart disease °history of irregular heartbeat °an unusual or allergic reaction to goserelin, other medicines, foods, dyes, or preservatives °pregnant or trying to get pregnant °breast-feeding °How should I use this medication? °This medicine is for injection under the skin. It is given by a health care professional in a hospital or clinic setting. °Talk to your pediatrician regarding the use of this medicine in children. Special care may be needed. °Overdosage: If you think you have taken too much of this medicine contact a poison control center or emergency room at once. °NOTE: This medicine is only for you. Do not share this medicine with others. °What if I miss a dose? °It is important not to miss your dose. Call your doctor or health care professional if you are unable to keep an appointment. °What may interact with this medication? °Do not take this medicine with any of the following medications: °cisapride °dronedarone °pimozide °thioridazine °This medicine may also interact with the following medications: °other medicines that prolong the QT interval (an abnormal heart  rhythm) °This list may not describe all possible interactions. Give your health care provider a list of all the medicines, herbs, non-prescription drugs, or dietary supplements you use. Also tell them if you smoke, drink alcohol, or use illegal drugs. Some items may interact with your medicine. °What should I watch for while using this medication? °Visit your doctor or health care provider for regular checks on your progress. Your symptoms may appear to get worse during the first weeks of this therapy. Tell your doctor or healthcare provider if your symptoms do not start to get better or if they get worse after this time. °Your bones may get weaker if you take this medicine for a long time. If you smoke or frequently drink alcohol you may increase your risk of bone loss. A family history of osteoporosis, chronic use of drugs for seizures (convulsions), or corticosteroids can also increase your risk of bone loss. Talk to your doctor about how to keep your bones strong. °This medicine should stop regular monthly menstruation in women. Tell your doctor if you continue to menstruate. °Women should not become pregnant while taking this medicine or for 12 weeks after stopping this medicine. Women should inform their doctor if they wish to become pregnant or think they might be pregnant. There is a potential for serious side effects to an unborn child. Talk to your health care professional or pharmacist for more information. Do not breast-feed an infant while taking this medicine. °Men should inform their doctors if they wish to father a child. This medicine may lower sperm counts. Talk to your health care professional or pharmacist for more information. °This   medicine may increase blood sugar. Ask your healthcare provider if changes in diet or medicines are needed if you have diabetes. °What side effects may I notice from receiving this medication? °Side effects that you should report to your doctor or health care  professional as soon as possible: °allergic reactions like skin rash, itching or hives, swelling of the face, lips, or tongue °bone pain °breathing problems °changes in vision °chest pain °feeling faint or lightheaded, falls °fever, chills °pain, swelling, warmth in the leg °pain, tingling, numbness in the hands or feet °signs and symptoms of high blood sugar such as being more thirsty or hungry or having to urinate more than normal. You may also feel very tired or have blurry vision °signs and symptoms of low blood pressure like dizziness; feeling faint or lightheaded, falls; unusually weak or tired °stomach pain °swelling of the ankles, feet, hands °trouble passing urine or change in the amount of urine °unusually high or low blood pressure °unusually weak or tired °Side effects that usually do not require medical attention (report to your doctor or health care professional if they continue or are bothersome): °change in sex drive or performance °changes in breast size in both males and females °changes in emotions or moods °headache °hot flashes °irritation at site where injected °loss of appetite °skin problems like acne, dry skin °vaginal dryness °This list may not describe all possible side effects. Call your doctor for medical advice about side effects. You may report side effects to FDA at 1-800-FDA-1088. °Where should I keep my medication? °This drug is given in a hospital or clinic and will not be stored at home. °NOTE: This sheet is a summary. It may not cover all possible information. If you have questions about this medicine, talk to your doctor, pharmacist, or health care provider. °© 2022 [employer] (2018-11-15 00:00:00) ° °

## 2021-11-11 ENCOUNTER — Inpatient Hospital Stay: Payer: 59 | Attending: Hematology and Oncology

## 2021-11-11 VITALS — BP 165/70 | HR 66 | Temp 99.9°F | Resp 18

## 2021-11-11 DIAGNOSIS — D509 Iron deficiency anemia, unspecified: Secondary | ICD-10-CM

## 2021-11-11 DIAGNOSIS — Z5111 Encounter for antineoplastic chemotherapy: Secondary | ICD-10-CM | POA: Insufficient documentation

## 2021-11-11 DIAGNOSIS — C50411 Malignant neoplasm of upper-outer quadrant of right female breast: Secondary | ICD-10-CM | POA: Insufficient documentation

## 2021-11-11 MED ORDER — GOSERELIN ACETATE 3.6 MG ~~LOC~~ IMPL
3.6000 mg | DRUG_IMPLANT | Freq: Once | SUBCUTANEOUS | Status: AC
  Administered 2021-11-11: 3.6 mg via SUBCUTANEOUS
  Filled 2021-11-11: qty 3.6

## 2021-11-11 NOTE — Patient Instructions (Signed)
Goserelin injection ?What is this medication? ?GOSERELIN (GOE se rel in) is similar to a hormone found in the body. It lowers the amount of sex hormones that the body makes. Men will have lower testosterone levels and women will have lower estrogen levels while taking this medicine. In men, this medicine is used to treat prostate cancer; the injection is either given once per month or once every 12 weeks. A once per month injection (only) is used to treat women with endometriosis, dysfunctional uterine bleeding, or advanced breast cancer. ?This medicine may be used for other purposes; ask your health care provider or pharmacist if you have questions. ?COMMON BRAND NAME(S): Zoladex, Zoladex 59-Month ?What should I tell my care team before I take this medication? ?They need to know if you have any of these conditions: ?bone problems ?diabetes ?heart disease ?history of irregular heartbeat ?an unusual or allergic reaction to goserelin, other medicines, foods, dyes, or preservatives ?pregnant or trying to get pregnant ?breast-feeding ?How should I use this medication? ?This medicine is for injection under the skin. It is given by a health care professional in a hospital or clinic setting. ?Talk to your pediatrician regarding the use of this medicine in children. Special care may be needed. ?Overdosage: If you think you have taken too much of this medicine contact a poison control center or emergency room at once. ?NOTE: This medicine is only for you. Do not share this medicine with others. ?What if I miss a dose? ?It is important not to miss your dose. Call your doctor or health care professional if you are unable to keep an appointment. ?What may interact with this medication? ?Do not take this medicine with any of the following medications: ?cisapride ?dronedarone ?pimozide ?thioridazine ?This medicine may also interact with the following medications: ?other medicines that prolong the QT interval (an abnormal heart  rhythm) ?This list may not describe all possible interactions. Give your health care provider a list of all the medicines, herbs, non-prescription drugs, or dietary supplements you use. Also tell them if you smoke, drink alcohol, or use illegal drugs. Some items may interact with your medicine. ?What should I watch for while using this medication? ?Visit your doctor or health care provider for regular checks on your progress. Your symptoms may appear to get worse during the first weeks of this therapy. Tell your doctor or healthcare provider if your symptoms do not start to get better or if they get worse after this time. ?Your bones may get weaker if you take this medicine for a long time. If you smoke or frequently drink alcohol you may increase your risk of bone loss. A family history of osteoporosis, chronic use of drugs for seizures (convulsions), or corticosteroids can also increase your risk of bone loss. Talk to your doctor about how to keep your bones strong. ?This medicine should stop regular monthly menstruation in women. Tell your doctor if you continue to menstruate. ?Women should not become pregnant while taking this medicine or for 12 weeks after stopping this medicine. Women should inform their doctor if they wish to become pregnant or think they might be pregnant. There is a potential for serious side effects to an unborn child. Talk to your health care professional or pharmacist for more information. Do not breast-feed an infant while taking this medicine. ?Men should inform their doctors if they wish to father a child. This medicine may lower sperm counts. Talk to your health care professional or pharmacist for more information. ?This  medicine may increase blood sugar. Ask your healthcare provider if changes in diet or medicines are needed if you have diabetes. What side effects may I notice from receiving this medication? Side effects that you should report to your doctor or health care  professional as soon as possible: allergic reactions like skin rash, itching or hives, swelling of the face, lips, or tongue bone pain breathing problems changes in vision chest pain feeling faint or lightheaded, falls fever, chills pain, swelling, warmth in the leg pain, tingling, numbness in the hands or feet signs and symptoms of high blood sugar such as being more thirsty or hungry or having to urinate more than normal. You may also feel very tired or have blurry vision signs and symptoms of low blood pressure like dizziness; feeling faint or lightheaded, falls; unusually weak or tired stomach pain swelling of the ankles, feet, hands trouble passing urine or change in the amount of urine unusually high or low blood pressure unusually weak or tired Side effects that usually do not require medical attention (report to your doctor or health care professional if they continue or are bothersome): change in sex drive or performance changes in breast size in both males and females changes in emotions or moods headache hot flashes irritation at site where injected loss of appetite skin problems like acne, dry skin vaginal dryness This list may not describe all possible side effects. Call your doctor for medical advice about side effects. You may report side effects to FDA at 1-800-FDA-1088. Where should I keep my medication? This drug is given in a hospital or clinic and will not be stored at home. NOTE: This sheet is a summary. It may not cover all possible information. If you have questions about this medicine, talk to your doctor, pharmacist, or health care provider.  2023 Elsevier/Gold Standard (2018-11-15 00:00:00)  

## 2021-11-17 ENCOUNTER — Encounter: Payer: Self-pay | Admitting: Internal Medicine

## 2021-11-17 ENCOUNTER — Ambulatory Visit (INDEPENDENT_AMBULATORY_CARE_PROVIDER_SITE_OTHER): Payer: 59 | Admitting: Internal Medicine

## 2021-11-17 DIAGNOSIS — I10 Essential (primary) hypertension: Secondary | ICD-10-CM

## 2021-11-17 NOTE — Assessment & Plan Note (Signed)
Better ?SBP 135 at home, DBP 70-80. ?Going back to work on Dec 05, 2021 ?

## 2021-11-17 NOTE — Progress Notes (Signed)
? ?Subjective:  ?Patient ID: Jasmine Stafford, female    DOB: 25-Dec-1965  Age: 56 y.o. MRN: [id number] ? ?CC: No chief complaint on file. ? ? ?HPI ?[name] presents for HTN ?SBP 135 at home, DBP 70-80. ?Going back to work on Dec 05, 2021 ? ?Outpatient Medications Prior to Visit  ?Medication Sig Dispense Refill  ? anastrozole (ARIMIDEX) 1 MG tablet Take 1 tablet (1 mg total) by mouth daily. 90 tablet 3  ? Ascorbic Acid (VITAMIN C) 1000 MG tablet Take 3,000 mg by mouth daily.     ? Calcium Carb-Cholecalciferol 600-200 MG-UNIT TABS Take 1 tablet by mouth daily.    ? Cholecalciferol (VITAMIN D3) 10000 UNITS capsule Take 10,000 Units by mouth daily.    ? Coenzyme Q10 (COQ10) 400 MG CAPS Take 400 mg by mouth daily.    ? DANDELION PO Take 2 capsules by mouth daily.     ? Digestive Enzymes CAPS Take 2 capsules by mouth 3 (three) times daily with meals.    ? ferrous gluconate (FERGON) 325 MG tablet Take 1 tablet (325 mg total) by mouth 3 (three) times daily with meals. 30 tablet 0  ? GARLIC PO Take 3 capsules by mouth daily.    ? hydrALAZINE (APRESOLINE) 100 MG tablet Take 1 tablet (100 mg total) by mouth 3 (three) times daily. 270 tablet 3  ? hydrochlorothiazide (HYDRODIURIL) 25 MG tablet Take 1 tablet (25 mg total) by mouth daily. 90 tablet 3  ? ipratropium-albuterol (DUONEB) 0.5-2.5 (3) MG/3ML SOLN Take 3 mLs by nebulization 2 (two) times daily. (Patient taking differently: Take 3 mLs by nebulization daily as needed (wheezing).) 360 mL 0  ? labetalol (NORMODYNE) 300 MG tablet Take 2 tablets (600 mg total) by mouth 2 (two) times daily. 360 tablet 3  ? Lactobacillus (PROBIOTIC ACIDOPHILUS PO) Take 2 tablets by mouth daily.    ? Melatonin 10 MG TABS Take 20 mg by mouth at bedtime as needed (sleep).     ? Multiple Vitamin (MULTIVITAMIN WITH MINERALS) TABS tablet Take 1 tablet by mouth at bedtime.    ? Multiple Vitamins-Iron (CHLORELLA PO) Take 4 g by mouth daily.    ? olmesartan (BENICAR) 20 MG tablet Take 1  tablet (20 mg total) by mouth daily. 90 tablet 3  ? Turmeric POWD Take 7.5 mLs by mouth at bedtime.     ? ?No facility-administered medications prior to visit.  ? ? ?ROS: ?Review of Systems  ?Constitutional:  Positive for fever. Negative for activity change, appetite change, chills, fatigue and unexpected weight change.  ?HENT:  Negative for congestion, mouth sores and sinus pressure.   ?Eyes:  Negative for visual disturbance.  ?Respiratory:  Negative for cough and chest tightness.   ?Gastrointestinal:  Negative for abdominal pain and nausea.  ?Genitourinary:  Negative for difficulty urinating, frequency and vaginal pain.  ?Musculoskeletal:  Negative for back pain and gait problem.  ?Skin:  Negative for pallor and rash.  ?Neurological:  Negative for dizziness, tremors, weakness, numbness and headaches.  ?Psychiatric/Behavioral:  Negative for confusion and sleep disturbance.   ? ?Objective:  ?BP (!) 150/62 (BP Location: Left Arm, Patient Position: Sitting, Cuff Size: Large)   Pulse 67   Temp 98.9 ?F (37.2 ?C) (Oral)   Ht '5\' 4"'$  (1.626 m)   Wt 296 lb (134.3 kg)   SpO2 98%   BMI 50.81 kg/m?  ? ?BP Readings from Last 3 Encounters:  ?11/17/21 (!) 150/62  ?11/11/21 (!) 165/70  ?10/14/21 (!) 187/74  ? ? ?  Wt Readings from Last 3 Encounters:  ?11/17/21 296 lb (134.3 kg)  ?09/16/21 298 lb (135.2 kg)  ?06/02/21 (!) 302 lb 3.2 oz (137.1 kg)  ? ? ?Physical Exam ?Constitutional:   ?   General: She is not in acute distress. ?   Appearance: She is well-developed. She is obese.  ?HENT:  ?   Head: Normocephalic.  ?   Right Ear: External ear normal.  ?   Left Ear: External ear normal.  ?   Nose: Nose normal.  ?Eyes:  ?   General:     ?   Right eye: No discharge.     ?   Left eye: No discharge.  ?   Conjunctiva/sclera: Conjunctivae normal.  ?   Pupils: Pupils are equal, round, and reactive to light.  ?Neck:  ?   Thyroid: No thyromegaly.  ?   Vascular: No JVD.  ?   Trachea: No tracheal deviation.  ?Cardiovascular:  ?   Rate and  Rhythm: Normal rate and regular rhythm.  ?   Heart sounds: Normal heart sounds.  ?Pulmonary:  ?   Effort: No respiratory distress.  ?   Breath sounds: No stridor. No wheezing.  ?Abdominal:  ?   General: Bowel sounds are normal. There is no distension.  ?   Palpations: Abdomen is soft. There is no mass.  ?   Tenderness: There is no abdominal tenderness. There is no guarding or rebound.  ?Musculoskeletal:     ?   General: No tenderness.  ?   Cervical back: Normal range of motion and neck supple. No rigidity.  ?Lymphadenopathy:  ?   Cervical: No cervical adenopathy.  ?Skin: ?   Findings: No erythema or rash.  ?Neurological:  ?   Cranial Nerves: No cranial nerve deficit.  ?   Motor: No abnormal muscle tone.  ?   Coordination: Coordination normal.  ?   Deep Tendon Reflexes: Reflexes normal.  ?Psychiatric:     ?   Behavior: Behavior normal.     ?   Thought Content: Thought content normal.     ?   Judgment: Judgment normal.  ? ? ?Lab Results  ?Component Value Date  ? WBC 9.7 06/02/2021  ? HGB 11.8 (L) 06/02/2021  ? HCT 37.1 06/02/2021  ? PLT 250.0 06/02/2021  ? GLUCOSE 85 06/02/2021  ? CHOL 188 06/02/2021  ? TRIG 121.0 06/02/2021  ? HDL 42.80 06/02/2021  ? LDLCALC 121 (H) 06/02/2021  ? ALT 23 06/02/2021  ? AST 21 06/02/2021  ? NA 141 06/02/2021  ? K 3.6 06/02/2021  ? CL 105 06/02/2021  ? CREATININE 1.09 06/02/2021  ? BUN 15 06/02/2021  ? CO2 31 06/02/2021  ? TSH 1.44 06/02/2021  ? ? ?US BREAST LTD UNI RIGHT INC AXILLA ? ?Result Date: 02/07/2021 ?CLINICAL DATA:  Diagnostic follow-up. Patient was diagnosed with invasive lobular carcinoma in 2014, but declined surgically. She has been managed with anastrozole and Zoladex. Patient underwent breast MRI on 07/09/2020 which demonstrated a mild decrease in non mass enhancement in the upper outer right breast the known site of malignancy, showing no evidence of disease progression. EXAM: DIGITAL DIAGNOSTIC BILATERAL MAMMOGRAM WITH TOMOSYNTHESIS AND CAD; ULTRASOUND RIGHT BREAST  LIMITED TECHNIQUE: Bilateral digital diagnostic mammography and breast tomosynthesis was performed. The images were evaluated with computer-aided detection.; Targeted ultrasound examination of the right breast was performed COMPARISON:  Previous exam(s). ACR Breast Density Category c: The breast tissue is heterogeneously dense, which may obscure small masses. FINDINGS: The  focal opacity with subtle distortion in the upper outer right breast, associated with the ribbon shaped biopsy clip, is unchanged. There are no new masses or areas of new asymmetry. No new areas of architectural distortion and no suspicious calcifications. Targeted right breast ultrasound is performed, showing subtle areas of distortion and shadowing and relative hypoechogenicity in the right breast at 10 o'clock, 9 cm the nipple, which may reflect 2 adjacent areas, spanning a total of 2.4 cm in long axis, the larger area of a measuring 1.2 x 1.0 cm radially. This is without significant change from the prior exam allowing for differences in sonographic measurement technique. IMPRESSION: 1. No evidence to suggest progression of the known invasive lobular carcinoma of the right breast. 2. No evidence of new malignancy. RECOMMENDATION: Diagnostic mammography with possible right breast ultrasound in 1 year. I have discussed the findings and recommendations with the patient. If applicable, a reminder letter will be sent to the patient regarding the next appointment. BI-RADS CATEGORY  6: Known biopsy-proven malignancy. Electronically Signed   By: Lajean Manes M.D.   On: 02/07/2021 16:21 ? ?MM DIAG BREAST TOMO BILATERAL ? ?Result Date: 02/07/2021 ?CLINICAL DATA:  Diagnostic follow-up. Patient was diagnosed with invasive lobular carcinoma in 2014, but declined surgically. She has been managed with anastrozole and Zoladex. Patient underwent breast MRI on 07/09/2020 which demonstrated a mild decrease in non mass enhancement in the upper outer right breast  the known site of malignancy, showing no evidence of disease progression. EXAM: DIGITAL DIAGNOSTIC BILATERAL MAMMOGRAM WITH TOMOSYNTHESIS AND CAD; ULTRASOUND RIGHT BREAST LIMITED TECHNIQUE: Bilateral digital diagnos

## 2021-12-01 ENCOUNTER — Telehealth: Payer: Self-pay | Admitting: Hematology and Oncology

## 2021-12-01 NOTE — Telephone Encounter (Signed)
Rescheduled appointment per providers. Patient aware.  ? ?

## 2021-12-09 NOTE — Progress Notes (Signed)
Patient Care Team: Plotnikov, Evie Lacks, MD as PCP - General  DIAGNOSIS:  Encounter Diagnosis  Name Primary?   Malignant neoplasm of upper-outer quadrant of right breast in female, estrogen receptor positive ([place])     SUMMARY OF ONCOLOGIC HISTORY: Oncology History  Cancer of upper-outer quadrant of female breast ([place])  08/09/2012 Initial Diagnosis   Right breast biopsy 10 o'clock position: Invasive lobular cancer, ALH, grade 1-2, ER 99%, PR 99%, Ki-67 13%, HER-2 negative ratio 1.3    09/26/2012 -  Anti-estrogen oral therapy   Zoladex with anastrozole prescribed at [facility] of [place] along with lots of herbs.  Annual mammograms and MRIs showed stable findings.  Patient never had surgery      CHIEF COMPLIANT:  Follow-up of right breast cancer on Zoladex and anastrozole     INTERVAL HISTORY: Jasmine Stafford is a 56 y.o. with above-mentioned history of right breast cancer currently on treatment with Zoladex and anastrozole. She presents to the clinic today for a annual follow-up. She states that everything is going well. She states she had to switch jobs because she was under a lot of stress. She states she is tolerating the anastrozole. She is continuing her holistic approach to her cancer care by taking supplements, essential oils as well as cleansers.  She is continuing her Zoladex with anastrozole and being compliant with that regimen.   ALLERGIES:  is allergic to other, shellfish allergy, erythromycin, moxifloxacin, penicillins, and tequin [gatifloxacin].  MEDICATIONS:  Current Outpatient Medications  Medication Sig Dispense Refill   anastrozole (ARIMIDEX) 1 MG tablet Take 1 tablet (1 mg total) by mouth daily. 90 tablet 3   Ascorbic Acid (VITAMIN C) 1000 MG tablet Take 3,000 mg by mouth daily.      Calcium Carb-Cholecalciferol 600-200 MG-UNIT TABS Take 1 tablet by mouth daily.     Cholecalciferol (VITAMIN D3) 10000 UNITS capsule Take 10,000 Units by  mouth daily.     Coenzyme Q10 (COQ10) 400 MG CAPS Take 400 mg by mouth daily.     DANDELION PO Take 2 capsules by mouth daily.      Digestive Enzymes CAPS Take 2 capsules by mouth 3 (three) times daily with meals.     ferrous gluconate (FERGON) 325 MG tablet Take 1 tablet (325 mg total) by mouth 3 (three) times daily with meals. 30 tablet 0   GARLIC PO Take 3 capsules by mouth daily.     hydrALAZINE (APRESOLINE) 100 MG tablet Take 1 tablet (100 mg total) by mouth 3 (three) times daily. 270 tablet 3   hydrochlorothiazide (HYDRODIURIL) 25 MG tablet Take 1 tablet (25 mg total) by mouth daily. 90 tablet 3   ipratropium-albuterol (DUONEB) 0.5-2.5 (3) MG/3ML SOLN Take 3 mLs by nebulization 2 (two) times daily. (Patient taking differently: Take 3 mLs by nebulization daily as needed (wheezing).) 360 mL 0   labetalol (NORMODYNE) 300 MG tablet Take 2 tablets (600 mg total) by mouth 2 (two) times daily. 360 tablet 3   Lactobacillus (PROBIOTIC ACIDOPHILUS PO) Take 2 tablets by mouth daily.     Melatonin 10 MG TABS Take 20 mg by mouth at bedtime as needed (sleep).      Multiple Vitamin (MULTIVITAMIN WITH MINERALS) TABS tablet Take 1 tablet by mouth at bedtime.     Multiple Vitamins-Iron (CHLORELLA PO) Take 4 g by mouth daily.     olmesartan (BENICAR) 20 MG tablet Take 1 tablet (20 mg total) by mouth daily. 90 tablet 3   Turmeric  POWD Take 7.5 mLs by mouth at bedtime.      No current facility-administered medications for this visit.    PHYSICAL EXAMINATION: ECOG PERFORMANCE STATUS: 1 - Symptomatic but completely ambulatory  Vitals:   12/19/21 1433  BP: (!) 171/74  Pulse: 60  Resp: 16  Temp: 97.8 F (36.6 C)  SpO2: 96%   Filed Weights   12/19/21 1433  Weight: (!) 304 lb (137.9 kg)    BREAST: No palpable masses or nodules in either right or left breasts. No palpable axillary supraclavicular or infraclavicular adenopathy no breast tenderness or nipple discharge. (exam performed in the presence of  a chaperone)  LABORATORY DATA:  I have reviewed the data as listed    Latest Ref Rng & Units 06/02/2021   11:40 AM 01/12/2020    9:59 AM 06/27/2019    2:32 PM  CMP  Glucose 70 - 99 mg/dL 85   96   126    BUN 6 - 23 mg/dL '15   17   23    ' Creatinine 0.40 - 1.20 mg/dL 1.09   1.11   1.23    Sodium 135 - 145 mEq/L 141   142   142    Potassium 3.5 - 5.1 mEq/L 3.6   3.6   3.6    Chloride 96 - 112 mEq/L 105   104   102    CO2 19 - 32 mEq/L '31   30   26    ' Calcium 8.4 - 10.5 mg/dL 9.4   9.6   9.4    Total Protein 6.0 - 8.3 g/dL 7.6   7.6   8.0    Total Bilirubin 0.2 - 1.2 mg/dL 0.6   0.4   0.4    Alkaline Phos 39 - 117 U/L 104   81   86    AST 0 - 37 U/L '21   30   25    ' ALT 0 - 35 U/L '23   25   23      ' Lab Results  Component Value Date   WBC 9.7 06/02/2021   HGB 11.8 (L) 06/02/2021   HCT 37.1 06/02/2021   MCV 91.0 06/02/2021   PLT 250.0 06/02/2021   NEUTROABS 6.6 06/02/2021    ASSESSMENT & PLAN:  Cancer of upper-outer quadrant of female breast ([place]) Right breast invasive lobular cancer grade 1-2, ER 99%, PR 99%, HER-2 negative Ratio 1.3, Ki-67 13%, initial MRI revealed 7.1 cm area of mass or non-mass enhancement. Current treatment: Zoladex (noncompliant, last given March 2019 but had not received many doses), anastrozole 1 mg daily Patient never had surgery because she does not wish to undergo mastectomy.   Breast MRI 07/04/2019: Significant decrease in the previous demonstrated non-mass enhancement UOQ right breast with the exception of 9 mmNME 8:30 position, interval decrease in background enhancement involving all 4 quadrants.  Left breast normal   Current Treatment: Zoladex with anastrozole started June 2020 (originally started in 2014) She continues with her herbal supplements and other medication     Surveillance: 1.  Mammogram 02/07/2021: Interval decrease in the biopsy-proven malignancy within the right breast 2. breast MRI 07/09/20: Dec NME UOQ rt breast. Dec background  enh, stable 0.8 cm LOQ Rt breast NME 3.  Breast exam: 12/19/2021: Benign   RTC in 1 year    Orders Placed This Encounter  Procedures   MM DIAG BREAST TOMO BILATERAL    Standing Status:   Future    Standing Expiration  Date:   12/20/2022    Order Specific Question:   Reason for Exam (SYMPTOM  OR DIAGNOSIS REQUIRED)    Answer:   breast cancer reassessment    Order Specific Question:   Is the patient pregnant?    Answer:   No    Order Specific Question:   Preferred imaging location?    Answer:   [facility]    Order Specific Question:   Release to patient    Answer:   Immediate   US BREAST LTD UNI RIGHT INC AXILLA    Standing Status:   Future    Standing Expiration Date:   12/20/2022    Order Specific Question:   Reason for Exam (SYMPTOM  OR DIAGNOSIS REQUIRED)    Answer:   Breast cancer reassessment    Order Specific Question:   Preferred imaging location?    Answer:   [facility]    Order Specific Question:   Release to patient    Answer:   Immediate   The patient has a good understanding of the overall plan. she agrees with it. she will call with any problems that may develop before the next visit here. Total time spent: 30 mins including face to face time and time spent for planning, charting and co-ordination of care   Harriette Ohara, MD 12/19/21    I Gardiner Coins am scribing for Dr. Lindi Adie  I have reviewed the above documentation for accuracy and completeness, and I agree with the above.

## 2021-12-15 ENCOUNTER — Ambulatory Visit: Payer: 59 | Admitting: Internal Medicine

## 2021-12-16 ENCOUNTER — Ambulatory Visit: Payer: 59

## 2021-12-16 ENCOUNTER — Ambulatory Visit: Payer: 59 | Admitting: Hematology and Oncology

## 2021-12-19 ENCOUNTER — Inpatient Hospital Stay: Payer: 59

## 2021-12-19 ENCOUNTER — Inpatient Hospital Stay: Payer: 59 | Attending: Hematology and Oncology | Admitting: Hematology and Oncology

## 2021-12-19 ENCOUNTER — Other Ambulatory Visit: Payer: Self-pay

## 2021-12-19 DIAGNOSIS — C50411 Malignant neoplasm of upper-outer quadrant of right female breast: Secondary | ICD-10-CM | POA: Diagnosis present

## 2021-12-19 DIAGNOSIS — Z170 Estrogen receptor positive status [ER+]: Secondary | ICD-10-CM | POA: Diagnosis not present

## 2021-12-19 DIAGNOSIS — D509 Iron deficiency anemia, unspecified: Secondary | ICD-10-CM

## 2021-12-19 DIAGNOSIS — Z5111 Encounter for antineoplastic chemotherapy: Secondary | ICD-10-CM | POA: Diagnosis present

## 2021-12-19 MED ORDER — GOSERELIN ACETATE 3.6 MG ~~LOC~~ IMPL
3.6000 mg | DRUG_IMPLANT | Freq: Once | SUBCUTANEOUS | Status: AC
  Administered 2021-12-19: 3.6 mg via SUBCUTANEOUS
  Filled 2021-12-19: qty 3.6

## 2021-12-19 NOTE — Assessment & Plan Note (Signed)
Right breast invasive lobular cancer grade 1-2, ER 99%, PR 99%, HER-2 negative Ratio 1.3, Ki-67 13%, initial MRI revealed 7.1 cm area of mass or non-mass enhancement. Current treatment: Zoladex(noncompliant, last given March 2019 but had not received many doses),anastrozole 1 mg daily Patient never had surgery because she does not wish to undergo mastectomy.  Breast MRI 07/04/2019: Significant decrease in the previous demonstrated non-mass enhancement UOQ right breast with the exception of 9 mmNME8:30 position, interval decrease in background enhancement involving all 4 quadrants. Left breast normal  Current Treatment: Zoladex withanastrozolestarted June 2020(originally started in 2014) She continueswith her herbal supplements and other medication  I discussed with her about stopping Zoladex and checking if she is in menopause.  Surveillance: 1.  Mammogram 02/07/2021: Interval decrease in the biopsy-proven malignancy within the right breast 2. breast MRI 07/09/20: Dec NME UOQ rt breast. Dec background enh, stable 0.8 cm LOQ Rt breast NME 3.  Breast exam: 12/19/2021: Benign  RTC in 1 year

## 2021-12-19 NOTE — Patient Instructions (Signed)
Goserelin injection What is this medication? GOSERELIN (GOE se rel in) is similar to a hormone found in the body. It lowers the amount of sex hormones that the body makes. Men will have lower testosterone levels and women will have lower estrogen levels while taking this medicine. In men, this medicine is used to treat prostate cancer; the injection is either given once per month or once every 12 weeks. A once per month injection (only) is used to treat women with endometriosis, dysfunctional uterine bleeding, or advanced breast cancer. This medicine may be used for other purposes; ask your health care provider or pharmacist if you have questions. COMMON BRAND NAME(S): Zoladex, Zoladex 37-Month What should I tell my care team before I take this medication? They need to know if you have any of these conditions: bone problems diabetes heart disease history of irregular heartbeat an unusual or allergic reaction to goserelin, other medicines, foods, dyes, or preservatives pregnant or trying to get pregnant breast-feeding How should I use this medication? This medicine is for injection under the skin. It is given by a health care professional in a hospital or clinic setting. Talk to your pediatrician regarding the use of this medicine in children. Special care may be needed. Overdosage: If you think you have taken too much of this medicine contact a poison control center or emergency room at once. NOTE: This medicine is only for you. Do not share this medicine with others. What if I miss a dose? It is important not to miss your dose. Call your doctor or health care professional if you are unable to keep an appointment. What may interact with this medication? Do not take this medicine with any of the following medications: cisapride dronedarone pimozide thioridazine This medicine may also interact with the following medications: other medicines that prolong the QT interval (an abnormal heart  rhythm) This list may not describe all possible interactions. Give your health care provider a list of all the medicines, herbs, non-prescription drugs, or dietary supplements you use. Also tell them if you smoke, drink alcohol, or use illegal drugs. Some items may interact with your medicine. What should I watch for while using this medication? Visit your doctor or health care provider for regular checks on your progress. Your symptoms may appear to get worse during the first weeks of this therapy. Tell your doctor or healthcare provider if your symptoms do not start to get better or if they get worse after this time. Your bones may get weaker if you take this medicine for a long time. If you smoke or frequently drink alcohol you may increase your risk of bone loss. A family history of osteoporosis, chronic use of drugs for seizures (convulsions), or corticosteroids can also increase your risk of bone loss. Talk to your doctor about how to keep your bones strong. This medicine should stop regular monthly menstruation in women. Tell your doctor if you continue to menstruate. Women should not become pregnant while taking this medicine or for 12 weeks after stopping this medicine. Women should inform their doctor if they wish to become pregnant or think they might be pregnant. There is a potential for serious side effects to an unborn child. Talk to your health care professional or pharmacist for more information. Do not breast-feed an infant while taking this medicine. Men should inform their doctors if they wish to father a child. This medicine may lower sperm counts. Talk to your health care professional or pharmacist for more information. This  medicine may increase blood sugar. Ask your healthcare provider if changes in diet or medicines are needed if you have diabetes. What side effects may I notice from receiving this medication? Side effects that you should report to your doctor or health care  professional as soon as possible: allergic reactions like skin rash, itching or hives, swelling of the face, lips, or tongue bone pain breathing problems changes in vision chest pain feeling faint or lightheaded, falls fever, chills pain, swelling, warmth in the leg pain, tingling, numbness in the hands or feet signs and symptoms of high blood sugar such as being more thirsty or hungry or having to urinate more than normal. You may also feel very tired or have blurry vision signs and symptoms of low blood pressure like dizziness; feeling faint or lightheaded, falls; unusually weak or tired stomach pain swelling of the ankles, feet, hands trouble passing urine or change in the amount of urine unusually high or low blood pressure unusually weak or tired Side effects that usually do not require medical attention (report to your doctor or health care professional if they continue or are bothersome): change in sex drive or performance changes in breast size in both males and females changes in emotions or moods headache hot flashes irritation at site where injected loss of appetite skin problems like acne, dry skin vaginal dryness This list may not describe all possible side effects. Call your doctor for medical advice about side effects. You may report side effects to FDA at 1-800-FDA-1088. Where should I keep my medication? This drug is given in a hospital or clinic and will not be stored at home. NOTE: This sheet is a summary. It may not cover all possible information. If you have questions about this medicine, talk to your doctor, pharmacist, or health care provider.  2023 Elsevier/Gold Standard (2018-11-15 00:00:00)  

## 2021-12-27 ENCOUNTER — Other Ambulatory Visit: Payer: Self-pay | Admitting: Hematology and Oncology

## 2022-01-13 ENCOUNTER — Encounter: Payer: Self-pay | Admitting: Hematology and Oncology

## 2022-01-20 ENCOUNTER — Encounter: Payer: Self-pay | Admitting: Hematology and Oncology

## 2022-01-20 ENCOUNTER — Inpatient Hospital Stay: Payer: Managed Care, Other (non HMO) | Attending: Hematology and Oncology

## 2022-01-20 ENCOUNTER — Other Ambulatory Visit: Payer: Self-pay

## 2022-01-20 VITALS — BP 228/91 | HR 60 | Temp 98.3°F | Resp 16

## 2022-01-20 DIAGNOSIS — Z5111 Encounter for antineoplastic chemotherapy: Secondary | ICD-10-CM | POA: Diagnosis present

## 2022-01-20 DIAGNOSIS — C50411 Malignant neoplasm of upper-outer quadrant of right female breast: Secondary | ICD-10-CM | POA: Insufficient documentation

## 2022-01-20 DIAGNOSIS — D509 Iron deficiency anemia, unspecified: Secondary | ICD-10-CM

## 2022-01-20 MED ORDER — GOSERELIN ACETATE 3.6 MG ~~LOC~~ IMPL
3.6000 mg | DRUG_IMPLANT | Freq: Once | SUBCUTANEOUS | Status: AC
  Administered 2022-01-20: 3.6 mg via SUBCUTANEOUS
  Filled 2022-01-20: qty 3.6

## 2022-01-20 NOTE — Progress Notes (Signed)
Patient has not taken her B/P meds today yet but will be taking her meds this evening

## 2022-02-08 ENCOUNTER — Ambulatory Visit
Admission: RE | Admit: 2022-02-08 | Discharge: 2022-02-08 | Disposition: A | Discharge status: Home / Self Care | Payer: Managed Care, Other (non HMO) | Source: Ambulatory Visit | Attending: Hematology and Oncology | Admitting: Hematology and Oncology

## 2022-02-08 ENCOUNTER — Ambulatory Visit
Admission: RE | Admit: 2022-02-08 | Discharge: 2022-02-08 | Disposition: A | Discharge status: Home / Self Care | Payer: 59 | Source: Ambulatory Visit | Attending: Hematology and Oncology | Admitting: Hematology and Oncology

## 2022-02-08 DIAGNOSIS — C50411 Malignant neoplasm of upper-outer quadrant of right female breast: Secondary | ICD-10-CM

## 2022-02-16 ENCOUNTER — Ambulatory Visit (INDEPENDENT_AMBULATORY_CARE_PROVIDER_SITE_OTHER): Payer: Managed Care, Other (non HMO) | Admitting: Internal Medicine

## 2022-02-16 ENCOUNTER — Encounter: Payer: Self-pay | Admitting: Internal Medicine

## 2022-02-16 VITALS — BP 182/88 | HR 58 | Temp 99.3°F | Ht 64.0 in | Wt 302.2 lb

## 2022-02-16 DIAGNOSIS — S43421A Sprain of right rotator cuff capsule, initial encounter: Secondary | ICD-10-CM

## 2022-02-16 DIAGNOSIS — Z0000 Encounter for general adult medical examination without abnormal findings: Secondary | ICD-10-CM | POA: Diagnosis not present

## 2022-02-16 DIAGNOSIS — S43429A Sprain of unspecified rotator cuff capsule, initial encounter: Secondary | ICD-10-CM | POA: Insufficient documentation

## 2022-02-16 DIAGNOSIS — I10 Essential (primary) hypertension: Secondary | ICD-10-CM | POA: Diagnosis not present

## 2022-02-16 NOTE — Progress Notes (Signed)
Subjective:  Patient ID: Jasmine Stafford, female    DOB: [birth year]  Age: 56 y.o. MRN: [id number]  CC: Follow-up (3 month follow up/) and Shoulder Pain (Burning pain( one in the arm, shoulder and close to the spine) been going for about 10 days )   HPI Jasmine Stafford presents for pain in arms and shoulders Has to drive 1 h each way daily  C/o R shoulder pain after she tried to catch her 56 yo nefew falling 2 wks ago  Outpatient Medications Prior to Visit  Medication Sig Dispense Refill   anastrozole (ARIMIDEX) 1 MG tablet TAKE 1 TABLET BY MOUTH EVERY DAY 90 tablet 3   Ascorbic Acid (VITAMIN C) 1000 MG tablet Take 3,000 mg by mouth daily.      Calcium Carb-Cholecalciferol 600-200 MG-UNIT TABS Take 1 tablet by mouth daily.     Cholecalciferol (VITAMIN D3) 10000 UNITS capsule Take 10,000 Units by mouth daily.     Coenzyme Q10 (COQ10) 400 MG CAPS Take 400 mg by mouth daily.     DANDELION PO Take 2 capsules by mouth daily.      Digestive Enzymes CAPS Take 2 capsules by mouth 3 (three) times daily with meals.     ferrous gluconate (FERGON) 325 MG tablet Take 1 tablet (325 mg total) by mouth 3 (three) times daily with meals. 30 tablet 0   GARLIC PO Take 3 capsules by mouth daily.     hydrALAZINE (APRESOLINE) 100 MG tablet Take 1 tablet (100 mg total) by mouth 3 (three) times daily. 270 tablet 3   hydrochlorothiazide (HYDRODIURIL) 25 MG tablet Take 1 tablet (25 mg total) by mouth daily. 90 tablet 3   ipratropium-albuterol (DUONEB) 0.5-2.5 (3) MG/3ML SOLN Take 3 mLs by nebulization 2 (two) times daily. (Patient taking differently: Take 3 mLs by nebulization daily as needed (wheezing).) 360 mL 0   labetalol (NORMODYNE) 300 MG tablet Take 2 tablets (600 mg total) by mouth 2 (two) times daily. 360 tablet 3   Lactobacillus (PROBIOTIC ACIDOPHILUS PO) Take 2 tablets by mouth daily.     Melatonin 10 MG TABS Take 20 mg by mouth at bedtime as needed (sleep).      Multiple Vitamin  (MULTIVITAMIN WITH MINERALS) TABS tablet Take 1 tablet by mouth at bedtime.     Multiple Vitamins-Iron (CHLORELLA PO) Take 4 g by mouth daily.     olmesartan (BENICAR) 20 MG tablet Take 1 tablet (20 mg total) by mouth daily. 90 tablet 3   Turmeric POWD Take 7.5 mLs by mouth at bedtime.      No facility-administered medications prior to visit.    ROS: Review of Systems  Constitutional:  Negative for activity change, appetite change, chills, fatigue and unexpected weight change.  HENT:  Negative for congestion, mouth sores and sinus pressure.   Eyes:  Negative for visual disturbance.  Respiratory:  Negative for cough and chest tightness.   Gastrointestinal:  Negative for abdominal pain and nausea.  Genitourinary:  Negative for difficulty urinating, frequency and vaginal pain.  Musculoskeletal:  Positive for arthralgias. Negative for back pain and gait problem.  Skin:  Negative for pallor and rash.  Neurological:  Negative for dizziness, tremors, weakness, numbness and headaches.  Psychiatric/Behavioral:  Negative for confusion and sleep disturbance.     Objective:  BP (!) 182/88   Pulse (!) 58   Temp 99.3 F (37.4 C) (Oral)   Ht '5\' 4"'$  (1.626 m)   Wt (!) 302 lb 4 oz (  137.1 kg)   SpO2 93%   BMI 51.88 kg/m   BP Readings from Last 3 Encounters:  02/16/22 (!) 182/88  01/20/22 (!) 228/91  12/19/21 (!) 171/74    Wt Readings from Last 3 Encounters:  02/16/22 (!) 302 lb 4 oz (137.1 kg)  12/19/21 (!) 304 lb (137.9 kg)  11/17/21 296 lb (134.3 kg)    Physical Exam Constitutional:      General: She is not in acute distress.    Appearance: She is well-developed.  HENT:     Head: Normocephalic.     Right Ear: External ear normal.     Left Ear: External ear normal.     Nose: Nose normal.  Eyes:     General:        Right eye: No discharge.        Left eye: No discharge.     Conjunctiva/sclera: Conjunctivae normal.     Pupils: Pupils are equal, round, and reactive to light.   Neck:     Thyroid: No thyromegaly.     Vascular: No JVD.     Trachea: No tracheal deviation.  Cardiovascular:     Rate and Rhythm: Normal rate and regular rhythm.     Heart sounds: Normal heart sounds.  Pulmonary:     Effort: No respiratory distress.     Breath sounds: No stridor. No wheezing.  Abdominal:     General: Bowel sounds are normal. There is no distension.     Palpations: Abdomen is soft. There is no mass.     Tenderness: There is no abdominal tenderness. There is no guarding or rebound.  Musculoskeletal:        General: Tenderness present.     Cervical back: Normal range of motion and neck supple. No rigidity.  Lymphadenopathy:     Cervical: No cervical adenopathy.  Skin:    Findings: No erythema or rash.  Neurological:     Mental Status: She is oriented to person, place, and time.     Cranial Nerves: No cranial nerve deficit.     Motor: No abnormal muscle tone.     Coordination: Coordination normal.     Deep Tendon Reflexes: Reflexes normal.  Psychiatric:        Behavior: Behavior normal.        Thought Content: Thought content normal.        Judgment: Judgment normal.    R shoulder w/pain on ROM Lab Results  Component Value Date   WBC 9.7 06/02/2021   HGB 11.8 (L) 06/02/2021   HCT 37.1 06/02/2021   PLT 250.0 06/02/2021   GLUCOSE 85 06/02/2021   CHOL 188 06/02/2021   TRIG 121.0 06/02/2021   HDL 42.80 06/02/2021   LDLCALC 121 (H) 06/02/2021   ALT 23 06/02/2021   AST 21 06/02/2021   NA 141 06/02/2021   K 3.6 06/02/2021   CL 105 06/02/2021   CREATININE 1.09 06/02/2021   BUN 15 06/02/2021   CO2 31 06/02/2021   TSH 1.44 06/02/2021    MM DIAG BREAST TOMO BILATERAL  Result Date: 02/08/2022 CLINICAL DATA:  56 year old female with biopsy-proven invasive lobular carcinoma in the upper-outer quadrant of the right breast which was diagnosed in 2014. The patient declined surgical excision, but continues on anastrozole and Zoladex. EXAM: DIGITAL DIAGNOSTIC  BILATERAL MAMMOGRAM WITH TOMOSYNTHESIS AND CAD; ULTRASOUND RIGHT BREAST LIMITED TECHNIQUE: Bilateral digital diagnostic mammography and breast tomosynthesis was performed. The images were evaluated with computer-aided detection.; Targeted ultrasound examination of the  right breast was performed COMPARISON:  Previous exam(s). ACR Breast Density Category b: There are scattered areas of fibroglandular density. FINDINGS: The biopsy proven malignancy in the lateral right breast appears mammographically stable. No suspicious calcifications, masses or areas of distortion are seen in the bilateral breasts. Ultrasound targeted to the right breast at 10 o'clock, 9 cm from the nipple a very ill-defined hyperechoic irregular non mass lesion measuring roughly 2.7 x 0.9 x 0.8 cm, previously 2.4 x 1.0 x 1.2 cm. The borders are extremely indistinct, and the abnormality branches in the tissue making estimation by ultrasound very imprecise. IMPRESSION: 1. The known cancer in the right breast at 10 o'clock appears mammographically stable. No significant sonographic changes. 2.  No evidence of left breast malignancy. RECOMMENDATION: Bilateral diagnostic mammogram and possible right breast ultrasound in 1 year. Surveillance with MRI will likely be more useful than ultrasound due the ill-defined subtle appearance of the cancer. I have discussed the findings and recommendations with the patient. If applicable, a reminder letter will be sent to the patient regarding the next appointment. BI-RADS CATEGORY  6: Known biopsy-proven malignancy. Electronically Signed   By: Ammie Ferrier M.D.   On: 02/08/2022 16:33  US BREAST LTD UNI RIGHT INC AXILLA  Result Date: 02/08/2022 CLINICAL DATA:  56 year old female with biopsy-proven invasive lobular carcinoma in the upper-outer quadrant of the right breast which was diagnosed in 2014. The patient declined surgical excision, but continues on anastrozole and Zoladex. EXAM: DIGITAL DIAGNOSTIC  BILATERAL MAMMOGRAM WITH TOMOSYNTHESIS AND CAD; ULTRASOUND RIGHT BREAST LIMITED TECHNIQUE: Bilateral digital diagnostic mammography and breast tomosynthesis was performed. The images were evaluated with computer-aided detection.; Targeted ultrasound examination of the right breast was performed COMPARISON:  Previous exam(s). ACR Breast Density Category b: There are scattered areas of fibroglandular density. FINDINGS: The biopsy proven malignancy in the lateral right breast appears mammographically stable. No suspicious calcifications, masses or areas of distortion are seen in the bilateral breasts. Ultrasound targeted to the right breast at 10 o'clock, 9 cm from the nipple a very ill-defined hyperechoic irregular non mass lesion measuring roughly 2.7 x 0.9 x 0.8 cm, previously 2.4 x 1.0 x 1.2 cm. The borders are extremely indistinct, and the abnormality branches in the tissue making estimation by ultrasound very imprecise. IMPRESSION: 1. The known cancer in the right breast at 10 o'clock appears mammographically stable. No significant sonographic changes. 2.  No evidence of left breast malignancy. RECOMMENDATION: Bilateral diagnostic mammogram and possible right breast ultrasound in 1 year. Surveillance with MRI will likely be more useful than ultrasound due the ill-defined subtle appearance of the cancer. I have discussed the findings and recommendations with the patient. If applicable, a reminder letter will be sent to the patient regarding the next appointment. BI-RADS CATEGORY  6: Known biopsy-proven malignancy. Electronically Signed   By: Ammie Ferrier M.D.   On: 02/08/2022 16:33   Assessment & Plan:   Problem List Items Addressed This Visit     Essential hypertension    Pt stopped Benicar due to low BP. We will re-start at 10 mg a day      Relevant Orders   TSH   Urinalysis   CBC with Differential/Platelet   Lipid panel   Comprehensive metabolic panel   Rotator cuff sprain - Primary    Other Visit Diagnoses     Well adult exam       Relevant Orders   TSH   Urinalysis   CBC with Differential/Platelet   Lipid panel  Comprehensive metabolic panel         No orders of the defined types were placed in this encounter.     Follow-up: Return in about 6 weeks (around 03/30/2022) for Wellness Exam.  Walker Kehr, MD

## 2022-02-16 NOTE — Assessment & Plan Note (Signed)
Pt stopped Benicar due to low BP. We will re-start at 10 mg a day

## 2022-02-16 NOTE — Patient Instructions (Addendum)
Blue-Emu cream --use 2-3 times a day  Exercise shoulders  for range of motion

## 2022-02-17 ENCOUNTER — Inpatient Hospital Stay: Payer: Managed Care, Other (non HMO) | Attending: Hematology and Oncology

## 2022-02-17 ENCOUNTER — Other Ambulatory Visit: Payer: Self-pay

## 2022-02-17 VITALS — BP 108/44 | HR 67 | Temp 98.5°F | Resp 20

## 2022-02-17 DIAGNOSIS — C50411 Malignant neoplasm of upper-outer quadrant of right female breast: Secondary | ICD-10-CM | POA: Insufficient documentation

## 2022-02-17 DIAGNOSIS — Z5111 Encounter for antineoplastic chemotherapy: Secondary | ICD-10-CM | POA: Insufficient documentation

## 2022-02-17 DIAGNOSIS — D509 Iron deficiency anemia, unspecified: Secondary | ICD-10-CM

## 2022-02-17 MED ORDER — GOSERELIN ACETATE 3.6 MG ~~LOC~~ IMPL
3.6000 mg | DRUG_IMPLANT | Freq: Once | SUBCUTANEOUS | Status: AC
  Administered 2022-02-17: 3.6 mg via SUBCUTANEOUS
  Filled 2022-02-17: qty 3.6

## 2022-03-17 ENCOUNTER — Other Ambulatory Visit: Payer: Self-pay

## 2022-03-17 ENCOUNTER — Inpatient Hospital Stay: Payer: Managed Care, Other (non HMO) | Attending: Hematology and Oncology

## 2022-03-17 VITALS — BP 233/94 | HR 59 | Temp 98.6°F | Resp 18

## 2022-03-17 DIAGNOSIS — C50411 Malignant neoplasm of upper-outer quadrant of right female breast: Secondary | ICD-10-CM | POA: Diagnosis present

## 2022-03-17 DIAGNOSIS — Z5111 Encounter for antineoplastic chemotherapy: Secondary | ICD-10-CM | POA: Insufficient documentation

## 2022-03-17 DIAGNOSIS — D509 Iron deficiency anemia, unspecified: Secondary | ICD-10-CM

## 2022-03-17 MED ORDER — GOSERELIN ACETATE 3.6 MG ~~LOC~~ IMPL
3.6000 mg | DRUG_IMPLANT | Freq: Once | SUBCUTANEOUS | Status: AC
  Administered 2022-03-17: 3.6 mg via SUBCUTANEOUS
  Filled 2022-03-17: qty 3.6

## 2022-03-30 ENCOUNTER — Ambulatory Visit: Payer: Managed Care, Other (non HMO) | Admitting: Internal Medicine

## 2022-04-14 ENCOUNTER — Other Ambulatory Visit: Payer: Self-pay

## 2022-04-14 ENCOUNTER — Inpatient Hospital Stay: Payer: Managed Care, Other (non HMO) | Attending: Hematology and Oncology

## 2022-04-14 VITALS — BP 167/72 | HR 62 | Temp 98.2°F | Resp 18

## 2022-04-14 DIAGNOSIS — C50411 Malignant neoplasm of upper-outer quadrant of right female breast: Secondary | ICD-10-CM | POA: Diagnosis present

## 2022-04-14 DIAGNOSIS — Z5111 Encounter for antineoplastic chemotherapy: Secondary | ICD-10-CM | POA: Insufficient documentation

## 2022-04-14 DIAGNOSIS — D509 Iron deficiency anemia, unspecified: Secondary | ICD-10-CM

## 2022-04-14 MED ORDER — GOSERELIN ACETATE 3.6 MG ~~LOC~~ IMPL
3.6000 mg | DRUG_IMPLANT | Freq: Once | SUBCUTANEOUS | Status: AC
  Administered 2022-04-14: 3.6 mg via SUBCUTANEOUS
  Filled 2022-04-14: qty 3.6

## 2022-04-17 ENCOUNTER — Ambulatory Visit: Payer: Managed Care, Other (non HMO) | Admitting: Internal Medicine

## 2022-05-12 ENCOUNTER — Inpatient Hospital Stay: Payer: Managed Care, Other (non HMO)

## 2022-05-15 ENCOUNTER — Inpatient Hospital Stay: Payer: Managed Care, Other (non HMO) | Attending: Hematology and Oncology

## 2022-05-15 ENCOUNTER — Other Ambulatory Visit: Payer: Self-pay

## 2022-05-15 VITALS — BP 139/70 | HR 57 | Temp 99.0°F | Resp 20

## 2022-05-15 DIAGNOSIS — C50411 Malignant neoplasm of upper-outer quadrant of right female breast: Secondary | ICD-10-CM | POA: Diagnosis present

## 2022-05-15 DIAGNOSIS — D509 Iron deficiency anemia, unspecified: Secondary | ICD-10-CM

## 2022-05-15 DIAGNOSIS — Z5111 Encounter for antineoplastic chemotherapy: Secondary | ICD-10-CM | POA: Diagnosis present

## 2022-05-15 MED ORDER — GOSERELIN ACETATE 3.6 MG ~~LOC~~ IMPL
3.6000 mg | DRUG_IMPLANT | Freq: Once | SUBCUTANEOUS | Status: AC
  Administered 2022-05-15: 3.6 mg via SUBCUTANEOUS
  Filled 2022-05-15: qty 3.6

## 2022-05-15 NOTE — Patient Instructions (Signed)
Goserelin Implant What is this medication? GOSERELIN (GOE se rel in) treats prostate cancer and breast cancer. It works by decreasing levels of the hormones testosterone and estrogen in the body. This prevents prostate and breast cancer cells from spreading or growing. It may also be used to treat endometriosis. This is a condition where the tissue that lines the uterus grows outside the uterus. It works by decreasing the amount of estrogen your body makes, which reduces heavy bleeding and pain. It can also be used to help thin the lining of the uterus before a surgery used to prevent or reduce heavy periods. This medicine may be used for other purposes; ask your health care provider or pharmacist if you have questions. COMMON BRAND NAME(S): Zoladex, Zoladex 3-Month What should I tell my care team before I take this medication? They need to know if you have any of these conditions: Bone problems Diabetes Heart disease History of irregular heartbeat or rhythm An unusual or allergic reaction to goserelin, other medications, foods, dyes, or preservatives Pregnant or trying to get pregnant Breastfeeding How should I use this medication? This medication is injected under the skin. It is given by your care team in a hospital or clinic setting. Talk to your care team about the use of this medication in children. Special care may be needed. Overdosage: If you think you have taken too much of this medicine contact a poison control center or emergency room at once. NOTE: This medicine is only for you. Do not share this medicine with others. What if I miss a dose? Keep appointments for follow-up doses. It is important not to miss your dose. Call your care team if you are unable to keep an appointment. What may interact with this medication? Do not take this medication with any of the following: Cisapride Dronedarone Pimozide Thioridazine This medication may also interact with the following: Other  medications that cause heart rhythm changes This list may not describe all possible interactions. Give your health care provider a list of all the medicines, herbs, non-prescription drugs, or dietary supplements you use. Also tell them if you smoke, drink alcohol, or use illegal drugs. Some items may interact with your medicine. What should I watch for while using this medication? Visit your care team for regular checks on your progress. Your symptoms may appear to get worse during the first weeks of this therapy. Tell your care team if your symptoms do not start to get better or if they get worse after this time. Using this medication for a long time may weaken your bones. If you smoke or frequently drink alcohol you may increase your risk of bone loss. A family history of osteoporosis, chronic use of medications for seizures (convulsions), or corticosteroids can also increase your risk of bone loss. The risk of bone fractures may be increased. Talk to your care team about your bone health. This medication may increase blood sugar. The risk may be higher in patients who already have diabetes. Ask your care team what you can do to lower your risk of diabetes while taking this medication. This medication should stop regular monthly menstruation in women. Tell your care team if you continue to menstruate. Talk to your care team if you wish to become pregnant or think you might be pregnant. This medication can cause serious birth defects if taken during pregnancy or for 12 weeks after stopping treatment. Talk to your care team about reliable forms of contraception. Do not breastfeed while taking this   medication. This medication may cause infertility. Talk to your care team if you are concerned about your fertility. What side effects may I notice from receiving this medication? Side effects that you should report to your care team as soon as possible: Allergic reactions--skin rash, itching, hives, swelling  of the face, lips, tongue, or throat Change in the amount of urine Heart attack--pain or tightness in the chest, shoulders, arms, or jaw, nausea, shortness of breath, cold or clammy skin, feeling faint or lightheaded Heart rhythm changes--fast or irregular heartbeat, dizziness, feeling faint or lightheaded, chest pain, trouble breathing High blood sugar (hyperglycemia)--increased thirst or amount of urine, unusual weakness or fatigue, blurry vision High calcium level--increased thirst or amount of urine, nausea, vomiting, confusion, unusual weakness or fatigue, bone pain Pain, redness, irritation, or bruising at the injection site Severe back pain, numbness or weakness of the hands, arms, legs, or feet, loss of coordination, loss of bowel or bladder control Stroke--sudden numbness or weakness of the face, arm, or leg, trouble speaking, confusion, trouble walking, loss of balance or coordination, dizziness, severe headache, change in vision Swelling and pain of the tumor site or lymph nodes Trouble passing urine Side effects that usually do not require medical attention (report to your care team if they continue or are bothersome): Change in sex drive or performance Headache Hot flashes Rapid or extreme change in emotion or mood Sweating Swelling of the ankles, hands, or feet Unusual vaginal discharge, itching, or odor This list may not describe all possible side effects. Call your doctor for medical advice about side effects. You may report side effects to FDA at 1-800-FDA-1088. Where should I keep my medication? This medication is given in a hospital or clinic. It will not be stored at home. NOTE: This sheet is a summary. It may not cover all possible information. If you have questions about this medicine, talk to your doctor, pharmacist, or health care provider.  2023 Elsevier/Gold Standard (2021-11-30 00:00:00)  

## 2022-06-09 ENCOUNTER — Inpatient Hospital Stay: Payer: Managed Care, Other (non HMO) | Attending: Hematology and Oncology

## 2022-06-09 ENCOUNTER — Other Ambulatory Visit: Payer: Self-pay

## 2022-06-09 VITALS — BP 164/78 | HR 58 | Temp 98.4°F | Resp 18

## 2022-06-09 DIAGNOSIS — C50411 Malignant neoplasm of upper-outer quadrant of right female breast: Secondary | ICD-10-CM | POA: Insufficient documentation

## 2022-06-09 DIAGNOSIS — Z5111 Encounter for antineoplastic chemotherapy: Secondary | ICD-10-CM | POA: Insufficient documentation

## 2022-06-09 DIAGNOSIS — D509 Iron deficiency anemia, unspecified: Secondary | ICD-10-CM

## 2022-06-09 MED ORDER — GOSERELIN ACETATE 3.6 MG ~~LOC~~ IMPL
3.6000 mg | DRUG_IMPLANT | Freq: Once | SUBCUTANEOUS | Status: AC
  Administered 2022-06-09: 3.6 mg via SUBCUTANEOUS
  Filled 2022-06-09: qty 3.6

## 2022-06-09 NOTE — Patient Instructions (Signed)
Goserelin Implant What is this medication? GOSERELIN (GOE se rel in) treats prostate cancer and breast cancer. It works by decreasing levels of the hormones testosterone and estrogen in the body. This prevents prostate and breast cancer cells from spreading or growing. It may also be used to treat endometriosis. This is a condition where the tissue that lines the uterus grows outside the uterus. It works by decreasing the amount of estrogen your body makes, which reduces heavy bleeding and pain. It can also be used to help thin the lining of the uterus before a surgery used to prevent or reduce heavy periods. This medicine may be used for other purposes; ask your health care provider or pharmacist if you have questions. COMMON BRAND NAME(S): Zoladex, Zoladex 3-Month What should I tell my care team before I take this medication? They need to know if you have any of these conditions: Bone problems Diabetes Heart disease History of irregular heartbeat or rhythm An unusual or allergic reaction to goserelin, other medications, foods, dyes, or preservatives Pregnant or trying to get pregnant Breastfeeding How should I use this medication? This medication is injected under the skin. It is given by your care team in a hospital or clinic setting. Talk to your care team about the use of this medication in children. Special care may be needed. Overdosage: If you think you have taken too much of this medicine contact a poison control center or emergency room at once. NOTE: This medicine is only for you. Do not share this medicine with others. What if I miss a dose? Keep appointments for follow-up doses. It is important not to miss your dose. Call your care team if you are unable to keep an appointment. What may interact with this medication? Do not take this medication with any of the following: Cisapride Dronedarone Pimozide Thioridazine This medication may also interact with the following: Other  medications that cause heart rhythm changes This list may not describe all possible interactions. Give your health care provider a list of all the medicines, herbs, non-prescription drugs, or dietary supplements you use. Also tell them if you smoke, drink alcohol, or use illegal drugs. Some items may interact with your medicine. What should I watch for while using this medication? Visit your care team for regular checks on your progress. Your symptoms may appear to get worse during the first weeks of this therapy. Tell your care team if your symptoms do not start to get better or if they get worse after this time. Using this medication for a long time may weaken your bones. If you smoke or frequently drink alcohol you may increase your risk of bone loss. A family history of osteoporosis, chronic use of medications for seizures (convulsions), or corticosteroids can also increase your risk of bone loss. The risk of bone fractures may be increased. Talk to your care team about your bone health. This medication may increase blood sugar. The risk may be higher in patients who already have diabetes. Ask your care team what you can do to lower your risk of diabetes while taking this medication. This medication should stop regular monthly menstruation in women. Tell your care team if you continue to menstruate. Talk to your care team if you wish to become pregnant or think you might be pregnant. This medication can cause serious birth defects if taken during pregnancy or for 12 weeks after stopping treatment. Talk to your care team about reliable forms of contraception. Do not breastfeed while taking this   medication. This medication may cause infertility. Talk to your care team if you are concerned about your fertility. What side effects may I notice from receiving this medication? Side effects that you should report to your care team as soon as possible: Allergic reactions--skin rash, itching, hives, swelling  of the face, lips, tongue, or throat Change in the amount of urine Heart attack--pain or tightness in the chest, shoulders, arms, or jaw, nausea, shortness of breath, cold or clammy skin, feeling faint or lightheaded Heart rhythm changes--fast or irregular heartbeat, dizziness, feeling faint or lightheaded, chest pain, trouble breathing High blood sugar (hyperglycemia)--increased thirst or amount of urine, unusual weakness or fatigue, blurry vision High calcium level--increased thirst or amount of urine, nausea, vomiting, confusion, unusual weakness or fatigue, bone pain Pain, redness, irritation, or bruising at the injection site Severe back pain, numbness or weakness of the hands, arms, legs, or feet, loss of coordination, loss of bowel or bladder control Stroke--sudden numbness or weakness of the face, arm, or leg, trouble speaking, confusion, trouble walking, loss of balance or coordination, dizziness, severe headache, change in vision Swelling and pain of the tumor site or lymph nodes Trouble passing urine Side effects that usually do not require medical attention (report to your care team if they continue or are bothersome): Change in sex drive or performance Headache Hot flashes Rapid or extreme change in emotion or mood Sweating Swelling of the ankles, hands, or feet Unusual vaginal discharge, itching, or odor This list may not describe all possible side effects. Call your doctor for medical advice about side effects. You may report side effects to FDA at 1-800-FDA-1088. Where should I keep my medication? This medication is given in a hospital or clinic. It will not be stored at home. NOTE: This sheet is a summary. It may not cover all possible information. If you have questions about this medicine, talk to your doctor, pharmacist, or health care provider.  2023 Elsevier/Gold Standard (2007-09-07 00:00:00)  

## 2022-07-07 ENCOUNTER — Inpatient Hospital Stay: Payer: Managed Care, Other (non HMO) | Attending: Hematology and Oncology

## 2022-07-07 ENCOUNTER — Other Ambulatory Visit: Payer: Self-pay

## 2022-07-07 VITALS — BP 144/71 | HR 69 | Temp 98.6°F | Resp 18

## 2022-07-07 DIAGNOSIS — C50411 Malignant neoplasm of upper-outer quadrant of right female breast: Secondary | ICD-10-CM | POA: Insufficient documentation

## 2022-07-07 DIAGNOSIS — Z5111 Encounter for antineoplastic chemotherapy: Secondary | ICD-10-CM | POA: Diagnosis present

## 2022-07-07 DIAGNOSIS — D509 Iron deficiency anemia, unspecified: Secondary | ICD-10-CM

## 2022-07-07 MED ORDER — GOSERELIN ACETATE 3.6 MG ~~LOC~~ IMPL
3.6000 mg | DRUG_IMPLANT | Freq: Once | SUBCUTANEOUS | Status: AC
  Administered 2022-07-07: 3.6 mg via SUBCUTANEOUS
  Filled 2022-07-07: qty 3.6

## 2022-07-21 ENCOUNTER — Other Ambulatory Visit: Payer: Self-pay | Admitting: Internal Medicine

## 2022-08-03 ENCOUNTER — Telehealth: Payer: Self-pay | Admitting: Internal Medicine

## 2022-08-03 MED ORDER — LABETALOL HCL 300 MG PO TABS: 600.0000 mg | ORAL_TABLET | Freq: Two times a day (BID) | ORAL | 0 refills | Status: DC

## 2022-08-03 NOTE — Telephone Encounter (Signed)
Sent 1 month to [facility] pt need overdue for appt.Marland KitchenChryl Heck

## 2022-08-03 NOTE — Telephone Encounter (Signed)
Caller & Relationship to patient: self   Call back number: [phone]  Date of last office visit:   Date of next office visit:  08/21/2022   Medication(s) to be refilled:  labetalol        Preferred Pharmacy:     [facility] [address]

## 2022-08-04 ENCOUNTER — Other Ambulatory Visit: Payer: Self-pay

## 2022-08-04 ENCOUNTER — Inpatient Hospital Stay: Payer: Managed Care, Other (non HMO) | Attending: Hematology and Oncology

## 2022-08-04 VITALS — BP 125/58 | HR 63 | Temp 98.5°F | Resp 18

## 2022-08-04 DIAGNOSIS — Z5111 Encounter for antineoplastic chemotherapy: Secondary | ICD-10-CM | POA: Diagnosis not present

## 2022-08-04 DIAGNOSIS — C50411 Malignant neoplasm of upper-outer quadrant of right female breast: Secondary | ICD-10-CM | POA: Insufficient documentation

## 2022-08-04 DIAGNOSIS — D509 Iron deficiency anemia, unspecified: Secondary | ICD-10-CM

## 2022-08-04 MED ORDER — GOSERELIN ACETATE 3.6 MG ~~LOC~~ IMPL
3.6000 mg | DRUG_IMPLANT | Freq: Once | SUBCUTANEOUS | Status: AC
  Administered 2022-08-04: 3.6 mg via SUBCUTANEOUS
  Filled 2022-08-04: qty 3.6

## 2022-08-21 ENCOUNTER — Ambulatory Visit: Payer: Managed Care, Other (non HMO) | Admitting: Internal Medicine

## 2022-08-21 ENCOUNTER — Encounter: Payer: Self-pay | Admitting: Internal Medicine

## 2022-08-21 ENCOUNTER — Other Ambulatory Visit (INDEPENDENT_AMBULATORY_CARE_PROVIDER_SITE_OTHER): Payer: Managed Care, Other (non HMO)

## 2022-08-21 VITALS — BP 150/84 | HR 65 | Temp 98.6°F | Ht 64.0 in | Wt 307.0 lb

## 2022-08-21 DIAGNOSIS — I10 Essential (primary) hypertension: Secondary | ICD-10-CM | POA: Diagnosis not present

## 2022-08-21 DIAGNOSIS — C50411 Malignant neoplasm of upper-outer quadrant of right female breast: Secondary | ICD-10-CM

## 2022-08-21 DIAGNOSIS — Z0000 Encounter for general adult medical examination without abnormal findings: Secondary | ICD-10-CM | POA: Diagnosis not present

## 2022-08-21 DIAGNOSIS — R5383 Other fatigue: Secondary | ICD-10-CM

## 2022-08-21 DIAGNOSIS — Z170 Estrogen receptor positive status [ER+]: Secondary | ICD-10-CM

## 2022-08-21 DIAGNOSIS — D509 Iron deficiency anemia, unspecified: Secondary | ICD-10-CM

## 2022-08-21 LAB — COMPREHENSIVE METABOLIC PANEL
ALT: 18 U/L (ref 0–35)
AST: 20 U/L (ref 0–37)
Albumin: 4.1 g/dL (ref 3.5–5.2)
Alkaline Phosphatase: 82 U/L (ref 39–117)
BUN: 19 mg/dL (ref 6–23)
CO2: 31 mEq/L (ref 19–32)
Calcium: 9.5 mg/dL (ref 8.4–10.5)
Chloride: 100 mEq/L (ref 96–112)
Creatinine, Ser: 1.13 mg/dL (ref 0.40–1.20)
GFR: 54.8 mL/min — ABNORMAL LOW (ref >60.00)
Glucose, Bld: 98 mg/dL (ref 70–99)
Potassium: 3.3 mEq/L — ABNORMAL LOW (ref 3.5–5.1)
Sodium: 140 mEq/L (ref 135–145)
Total Bilirubin: 0.4 mg/dL (ref 0.2–1.2)
Total Protein: 7.8 g/dL (ref 6.0–8.3)

## 2022-08-21 LAB — CBC WITH DIFFERENTIAL/PLATELET
Basophils Absolute: 0.1 10*3/uL (ref 0.0–0.1)
Basophils Relative: 0.8 % (ref 0.0–3.0)
Eosinophils Absolute: 0.1 10*3/uL (ref 0.0–0.7)
Eosinophils Relative: 1.6 % (ref 0.0–5.0)
HCT: 38.4 % (ref 36.0–46.0)
Hemoglobin: 12.6 g/dL (ref 12.0–15.0)
Lymphocytes Relative: 27.2 % (ref 12.0–46.0)
Lymphs Abs: 2.6 10*3/uL (ref 0.7–4.0)
MCHC: 32.8 g/dL (ref 30.0–36.0)
MCV: 90.3 fl (ref 78.0–100.0)
Monocytes Absolute: 0.7 10*3/uL (ref 0.1–1.0)
Monocytes Relative: 7.4 % (ref 3.0–12.0)
Neutro Abs: 6 10*3/uL (ref 1.4–7.7)
Neutrophils Relative %: 63 % (ref 43.0–77.0)
Platelets: 266 10*3/uL (ref 150.0–400.0)
RBC: 4.25 Mil/uL (ref 3.87–5.11)
RDW: 14.4 % (ref 11.5–15.5)
WBC: 9.5 10*3/uL (ref 4.0–10.5)

## 2022-08-21 LAB — LIPID PANEL
Cholesterol: 203 mg/dL — ABNORMAL HIGH (ref 0–200)
HDL: 41.3 mg/dL (ref >39.00)
LDL Cholesterol: 132 mg/dL — ABNORMAL HIGH (ref 0–99)
NonHDL: 161.29
Total CHOL/HDL Ratio: 5
Triglycerides: 146 mg/dL (ref 0.0–149.0)
VLDL: 29.2 mg/dL (ref 0.0–40.0)

## 2022-08-21 MED ORDER — HYDROCHLOROTHIAZIDE 25 MG PO TABS: 25.0000 mg | ORAL_TABLET | Freq: Every day | ORAL | 3 refills | Status: DC

## 2022-08-21 MED ORDER — LABETALOL HCL 300 MG PO TABS: 600.0000 mg | ORAL_TABLET | Freq: Two times a day (BID) | ORAL | 3 refills | Status: DC

## 2022-08-21 MED ORDER — HYDRALAZINE HCL 100 MG PO TABS: 100.0000 mg | ORAL_TABLET | Freq: Three times a day (TID) | ORAL | 3 refills | Status: AC

## 2022-08-21 NOTE — Assessment & Plan Note (Signed)
Off olmesartan

## 2022-08-21 NOTE — Progress Notes (Signed)
Subjective:  Patient ID: Jasmine Stafford, female    DOB: May 24, 1966  Age: 57 y.o. MRN: [id number]  CC: No chief complaint on file.   HPI Jasmine Stafford presents for a well exam F/u HTN. Pt is not taking Olmesartan due to HA Drivig to and from work 1h71mn x2 every day  Outpatient Medications Prior to Visit  Medication Sig Dispense Refill   anastrozole (ARIMIDEX) 1 MG tablet TAKE 1 TABLET BY MOUTH EVERY DAY 90 tablet 3   Ascorbic Acid (VITAMIN C) 1000 MG tablet Take 3,000 mg by mouth daily.      Calcium Carb-Cholecalciferol 600-200 MG-UNIT TABS Take 1 tablet by mouth daily.     Cholecalciferol (VITAMIN D3) 10000 UNITS capsule Take 10,000 Units by mouth daily.     Coenzyme Q10 (COQ10) 400 MG CAPS Take 400 mg by mouth daily.     DANDELION PO Take 2 capsules by mouth daily.      Digestive Enzymes CAPS Take 2 capsules by mouth 3 (three) times daily with meals.     ferrous gluconate (FERGON) 325 MG tablet Take 1 tablet (325 mg total) by mouth 3 (three) times daily with meals. 30 tablet 0   GARLIC PO Take 3 capsules by mouth daily.     ipratropium-albuterol (DUONEB) 0.5-2.5 (3) MG/3ML SOLN Take 3 mLs by nebulization 2 (two) times daily. (Patient taking differently: Take 3 mLs by nebulization daily as needed (wheezing).) 360 mL 0   Lactobacillus (PROBIOTIC ACIDOPHILUS PO) Take 2 tablets by mouth daily.     Melatonin 10 MG TABS Take 20 mg by mouth at bedtime as needed (sleep).      Multiple Vitamin (MULTIVITAMIN WITH MINERALS) TABS tablet Take 1 tablet by mouth at bedtime.     Multiple Vitamins-Iron (CHLORELLA PO) Take 4 g by mouth daily.     Turmeric POWD Take 7.5 mLs by mouth at bedtime.      hydrALAZINE (APRESOLINE) 100 MG tablet TAKE 1 TABLET BY MOUTH 3 TIMES DAILY. 270 tablet 3   hydrochlorothiazide (HYDRODIURIL) 25 MG tablet TAKE 1 TABLET (25 MG TOTAL) BY MOUTH DAILY. 90 tablet 3   labetalol (NORMODYNE) 300 MG tablet Take 2 tablets (600 mg total) by mouth 2 (two) times  daily. Overdue for physical w/ labs must see provider for refills 120 tablet 0   olmesartan (BENICAR) 20 MG tablet Take 1 tablet (20 mg total) by mouth daily. 90 tablet 3   No facility-administered medications prior to visit.    ROS: Review of Systems  Constitutional:  Negative for activity change, appetite change, chills, fatigue and unexpected weight change.  HENT:  Negative for congestion, mouth sores and sinus pressure.   Eyes:  Negative for visual disturbance.  Respiratory:  Negative for cough and chest tightness.   Gastrointestinal:  Negative for abdominal pain and nausea.  Genitourinary:  Negative for difficulty urinating, frequency and vaginal pain.  Musculoskeletal:  Negative for back pain and gait problem.  Skin:  Negative for pallor and rash.  Neurological:  Negative for dizziness, tremors, weakness, numbness and headaches.  Psychiatric/Behavioral:  Negative for confusion and sleep disturbance.     Objective:  BP (!) 150/84 (BP Location: Left Arm, Patient Position: Sitting, Cuff Size: Normal)   Pulse 65   Temp 98.6 F (37 C) (Oral)   Ht '5\' 4"'$  (1.626 m)   Wt (!) 307 lb (139.3 kg)   SpO2 97%   BMI 52.70 kg/m   BP Readings from Last 3 Encounters:  08/21/22 (Marland Kitchen  150/84  08/04/22 (!) 125/58  07/07/22 (!) 144/71    Wt Readings from Last 3 Encounters:  08/21/22 (!) 307 lb (139.3 kg)  02/16/22 (!) 302 lb 4 oz (137.1 kg)  12/19/21 (!) 304 lb (137.9 kg)    Physical Exam Constitutional:      General: She is not in acute distress.    Appearance: She is well-developed. She is obese.  HENT:     Head: Normocephalic.     Right Ear: External ear normal.     Left Ear: External ear normal.     Nose: Nose normal.  Eyes:     General:        Right eye: No discharge.        Left eye: No discharge.     Conjunctiva/sclera: Conjunctivae normal.     Pupils: Pupils are equal, round, and reactive to light.  Neck:     Thyroid: No thyromegaly.     Vascular: No JVD.      Trachea: No tracheal deviation.  Cardiovascular:     Rate and Rhythm: Normal rate and regular rhythm.     Heart sounds: Normal heart sounds.  Pulmonary:     Effort: No respiratory distress.     Breath sounds: No stridor. No wheezing.  Abdominal:     General: Bowel sounds are normal. There is no distension.     Palpations: Abdomen is soft. There is no mass.     Tenderness: There is no abdominal tenderness. There is no guarding or rebound.  Musculoskeletal:        General: No tenderness.     Cervical back: Normal range of motion and neck supple. No rigidity.  Lymphadenopathy:     Cervical: No cervical adenopathy.  Skin:    Findings: No erythema or rash.  Neurological:     Cranial Nerves: No cranial nerve deficit.     Motor: No abnormal muscle tone.     Coordination: Coordination normal.     Deep Tendon Reflexes: Reflexes normal.  Psychiatric:        Behavior: Behavior normal.        Thought Content: Thought content normal.        Judgment: Judgment normal.     Lab Results  Component Value Date   WBC 9.5 08/21/2022   HGB 12.6 08/21/2022   HCT 38.4 08/21/2022   PLT 266.0 08/21/2022   GLUCOSE 98 08/21/2022   CHOL 203 (H) 08/21/2022   TRIG 146.0 08/21/2022   HDL 41.30 08/21/2022   LDLCALC 132 (H) 08/21/2022   ALT 18 08/21/2022   AST 20 08/21/2022   NA 140 08/21/2022   K 3.3 (L) 08/21/2022   CL 100 08/21/2022   CREATININE 1.13 08/21/2022   BUN 19 08/21/2022   CO2 31 08/21/2022   TSH 2.05 08/21/2022    MM DIAG BREAST TOMO BILATERAL  Result Date: 02/08/2022 CLINICAL DATA:  57 year old female with biopsy-proven invasive lobular carcinoma in the upper-outer quadrant of the right breast which was diagnosed in 2014. The patient declined surgical excision, but continues on anastrozole and Zoladex. EXAM: DIGITAL DIAGNOSTIC BILATERAL MAMMOGRAM WITH TOMOSYNTHESIS AND CAD; ULTRASOUND RIGHT BREAST LIMITED TECHNIQUE: Bilateral digital diagnostic mammography and breast tomosynthesis  was performed. The images were evaluated with computer-aided detection.; Targeted ultrasound examination of the right breast was performed COMPARISON:  Previous exam(s). ACR Breast Density Category b: There are scattered areas of fibroglandular density. FINDINGS: The biopsy proven malignancy in the lateral right breast appears mammographically stable. No suspicious  calcifications, masses or areas of distortion are seen in the bilateral breasts. Ultrasound targeted to the right breast at 10 o'clock, 9 cm from the nipple a very ill-defined hyperechoic irregular non mass lesion measuring roughly 2.7 x 0.9 x 0.8 cm, previously 2.4 x 1.0 x 1.2 cm. The borders are extremely indistinct, and the abnormality branches in the tissue making estimation by ultrasound very imprecise. IMPRESSION: 1. The known cancer in the right breast at 10 o'clock appears mammographically stable. No significant sonographic changes. 2.  No evidence of left breast malignancy. RECOMMENDATION: Bilateral diagnostic mammogram and possible right breast ultrasound in 1 year. Surveillance with MRI will likely be more useful than ultrasound due the ill-defined subtle appearance of the cancer. I have discussed the findings and recommendations with the patient. If applicable, a reminder letter will be sent to the patient regarding the next appointment. BI-RADS CATEGORY  6: Known biopsy-proven malignancy. Electronically Signed   By: Ammie Ferrier M.D.   On: 02/08/2022 16:33  US BREAST LTD UNI RIGHT INC AXILLA  Result Date: 02/08/2022 CLINICAL DATA:  57 year old female with biopsy-proven invasive lobular carcinoma in the upper-outer quadrant of the right breast which was diagnosed in 2014. The patient declined surgical excision, but continues on anastrozole and Zoladex. EXAM: DIGITAL DIAGNOSTIC BILATERAL MAMMOGRAM WITH TOMOSYNTHESIS AND CAD; ULTRASOUND RIGHT BREAST LIMITED TECHNIQUE: Bilateral digital diagnostic mammography and breast tomosynthesis was  performed. The images were evaluated with computer-aided detection.; Targeted ultrasound examination of the right breast was performed COMPARISON:  Previous exam(s). ACR Breast Density Category b: There are scattered areas of fibroglandular density. FINDINGS: The biopsy proven malignancy in the lateral right breast appears mammographically stable. No suspicious calcifications, masses or areas of distortion are seen in the bilateral breasts. Ultrasound targeted to the right breast at 10 o'clock, 9 cm from the nipple a very ill-defined hyperechoic irregular non mass lesion measuring roughly 2.7 x 0.9 x 0.8 cm, previously 2.4 x 1.0 x 1.2 cm. The borders are extremely indistinct, and the abnormality branches in the tissue making estimation by ultrasound very imprecise. IMPRESSION: 1. The known cancer in the right breast at 10 o'clock appears mammographically stable. No significant sonographic changes. 2.  No evidence of left breast malignancy. RECOMMENDATION: Bilateral diagnostic mammogram and possible right breast ultrasound in 1 year. Surveillance with MRI will likely be more useful than ultrasound due the ill-defined subtle appearance of the cancer. I have discussed the findings and recommendations with the patient. If applicable, a reminder letter will be sent to the patient regarding the next appointment. BI-RADS CATEGORY  6: Known biopsy-proven malignancy. Electronically Signed   By: Ammie Ferrier M.D.   On: 02/08/2022 16:33   Assessment & Plan:   Problem List Items Addressed This Visit       Cardiovascular and Mediastinum   Essential hypertension - Primary    Off olmesartan      Relevant Medications   hydrALAZINE (APRESOLINE) 100 MG tablet   hydrochlorothiazide (HYDRODIURIL) 25 MG tablet   labetalol (NORMODYNE) 300 MG tablet     Other   Cancer of upper-outer quadrant of female breast ([facility])    On Arimidex.  Vitamin D daily      Fatigue    Stress management was discussed.      Iron  deficiency anemia    Obtain CBC      Well adult exam     We discussed age appropriate health related issues, including available/recomended screening tests and vaccinations. Labs were ordered to be later  reviewed . All questions were answered. We discussed one or more of the following - seat belt use, use of sunscreen/sun exposure exercise, fall risk reduction, second hand smoke exposure, firearm use and storage, seat belt use, a need for adhering to healthy diet and exercise.  Colonoscopy or Cologuard advised Labs were ordered.  All questions were answered.          Meds ordered this encounter  Medications   hydrALAZINE (APRESOLINE) 100 MG tablet    Sig: Take 1 tablet (100 mg total) by mouth 3 (three) times daily.    Dispense:  270 tablet    Refill:  3   hydrochlorothiazide (HYDRODIURIL) 25 MG tablet    Sig: Take 1 tablet (25 mg total) by mouth daily.    Dispense:  90 tablet    Refill:  3   labetalol (NORMODYNE) 300 MG tablet    Sig: Take 2 tablets (600 mg total) by mouth 2 (two) times daily. Overdue for physical w/ labs must see provider for refills    Dispense:  360 tablet    Refill:  3      Follow-up: Return in about 6 months (around 02/19/2023) for a follow-up visit.  Walker Kehr, MD

## 2022-08-22 ENCOUNTER — Other Ambulatory Visit: Payer: Self-pay | Admitting: Internal Medicine

## 2022-08-22 LAB — URINALYSIS
Bilirubin Urine: NEGATIVE
Hgb urine dipstick: NEGATIVE
Ketones, ur: NEGATIVE
Leukocytes,Ua: NEGATIVE
Nitrite: NEGATIVE
Specific Gravity, Urine: 1.025 (ref 1.000–1.030)
Total Protein, Urine: NEGATIVE
Urine Glucose: NEGATIVE
Urobilinogen, UA: 1 (ref 0.0–1.0)
pH: 7 (ref 5.0–8.0)

## 2022-08-22 LAB — TSH: TSH: 2.05 u[IU]/mL (ref 0.35–5.50)

## 2022-08-22 MED ORDER — POTASSIUM CHLORIDE CRYS ER 20 MEQ PO TBCR: 20.0000 meq | EXTENDED_RELEASE_TABLET | Freq: Every day | ORAL | 3 refills | Status: AC

## 2022-08-27 NOTE — Assessment & Plan Note (Signed)
On Arimidex.  Vitamin D daily

## 2022-08-27 NOTE — Assessment & Plan Note (Signed)
Obtain CBC 

## 2022-08-27 NOTE — Assessment & Plan Note (Signed)
Stress management was discussed.

## 2022-08-30 DIAGNOSIS — Z0000 Encounter for general adult medical examination without abnormal findings: Secondary | ICD-10-CM | POA: Insufficient documentation

## 2022-08-30 NOTE — Assessment & Plan Note (Addendum)
  We discussed age appropriate health related issues, including available/recomended screening tests and vaccinations. Labs were ordered to be later reviewed . All questions were answered. We discussed one or more of the following - seat belt use, use of sunscreen/sun exposure exercise, fall risk reduction, second hand smoke exposure, firearm use and storage, seat belt use, a need for adhering to healthy diet and exercise.  Colonoscopy or Cologuard advised Labs were ordered.  All questions were answered.

## 2022-09-01 ENCOUNTER — Other Ambulatory Visit: Payer: Self-pay

## 2022-09-01 ENCOUNTER — Inpatient Hospital Stay: Payer: Managed Care, Other (non HMO) | Attending: Hematology and Oncology

## 2022-09-01 VITALS — BP 171/77 | HR 57 | Temp 98.2°F | Resp 18

## 2022-09-01 DIAGNOSIS — Z5111 Encounter for antineoplastic chemotherapy: Secondary | ICD-10-CM | POA: Insufficient documentation

## 2022-09-01 DIAGNOSIS — C50411 Malignant neoplasm of upper-outer quadrant of right female breast: Secondary | ICD-10-CM | POA: Diagnosis present

## 2022-09-01 DIAGNOSIS — D509 Iron deficiency anemia, unspecified: Secondary | ICD-10-CM

## 2022-09-01 MED ORDER — GOSERELIN ACETATE 3.6 MG ~~LOC~~ IMPL
3.6000 mg | DRUG_IMPLANT | Freq: Once | SUBCUTANEOUS | Status: AC
  Administered 2022-09-01: 3.6 mg via SUBCUTANEOUS
  Filled 2022-09-01: qty 3.6

## 2022-09-01 NOTE — Patient Instructions (Signed)
Goserelin Implant What is this medication? GOSERELIN (GOE se rel in) treats prostate cancer and breast cancer. It works by decreasing levels of the hormones testosterone and estrogen in the body. This prevents prostate and breast cancer cells from spreading or growing. It may also be used to treat endometriosis. This is a condition where the tissue that lines the uterus grows outside the uterus. It works by decreasing the amount of estrogen your body makes, which reduces heavy bleeding and pain. It can also be used to help thin the lining of the uterus before a surgery used to prevent or reduce heavy periods. This medicine may be used for other purposes; ask your health care provider or pharmacist if you have questions. COMMON BRAND NAME(S): Zoladex, Zoladex 3-Month What should I tell my care team before I take this medication? They need to know if you have any of these conditions: Bone problems Diabetes Heart disease History of irregular heartbeat or rhythm An unusual or allergic reaction to goserelin, other medications, foods, dyes, or preservatives Pregnant or trying to get pregnant Breastfeeding How should I use this medication? This medication is injected under the skin. It is given by your care team in a hospital or clinic setting. Talk to your care team about the use of this medication in children. Special care may be needed. Overdosage: If you think you have taken too much of this medicine contact a poison control center or emergency room at once. NOTE: This medicine is only for you. Do not share this medicine with others. What if I miss a dose? Keep appointments for follow-up doses. It is important not to miss your dose. Call your care team if you are unable to keep an appointment. What may interact with this medication? Do not take this medication with any of the following: Cisapride Dronedarone Pimozide Thioridazine This medication may also interact with the following: Other  medications that cause heart rhythm changes This list may not describe all possible interactions. Give your health care provider a list of all the medicines, herbs, non-prescription drugs, or dietary supplements you use. Also tell them if you smoke, drink alcohol, or use illegal drugs. Some items may interact with your medicine. What should I watch for while using this medication? Visit your care team for regular checks on your progress. Your symptoms may appear to get worse during the first weeks of this therapy. Tell your care team if your symptoms do not start to get better or if they get worse after this time. Using this medication for a long time may weaken your bones. If you smoke or frequently drink alcohol you may increase your risk of bone loss. A family history of osteoporosis, chronic use of medications for seizures (convulsions), or corticosteroids can also increase your risk of bone loss. The risk of bone fractures may be increased. Talk to your care team about your bone health. This medication may increase blood sugar. The risk may be higher in patients who already have diabetes. Ask your care team what you can do to lower your risk of diabetes while taking this medication. This medication should stop regular monthly menstruation in women. Tell your care team if you continue to menstruate. Talk to your care team if you wish to become pregnant or think you might be pregnant. This medication can cause serious birth defects if taken during pregnancy or for 12 weeks after stopping treatment. Talk to your care team about reliable forms of contraception. Do not breastfeed while taking this   medication. This medication may cause infertility. Talk to your care team if you are concerned about your fertility. What side effects may I notice from receiving this medication? Side effects that you should report to your care team as soon as possible: Allergic reactions--skin rash, itching, hives, swelling  of the face, lips, tongue, or throat Change in the amount of urine Heart attack--pain or tightness in the chest, shoulders, arms, or jaw, nausea, shortness of breath, cold or clammy skin, feeling faint or lightheaded Heart rhythm changes--fast or irregular heartbeat, dizziness, feeling faint or lightheaded, chest pain, trouble breathing High blood sugar (hyperglycemia)--increased thirst or amount of urine, unusual weakness or fatigue, blurry vision High calcium level--increased thirst or amount of urine, nausea, vomiting, confusion, unusual weakness or fatigue, bone pain Pain, redness, irritation, or bruising at the injection site Severe back pain, numbness or weakness of the hands, arms, legs, or feet, loss of coordination, loss of bowel or bladder control Stroke--sudden numbness or weakness of the face, arm, or leg, trouble speaking, confusion, trouble walking, loss of balance or coordination, dizziness, severe headache, change in vision Swelling and pain of the tumor site or lymph nodes Trouble passing urine Side effects that usually do not require medical attention (report to your care team if they continue or are bothersome): Change in sex drive or performance Headache Hot flashes Rapid or extreme change in emotion or mood Sweating Swelling of the ankles, hands, or feet Unusual vaginal discharge, itching, or odor This list may not describe all possible side effects. Call your doctor for medical advice about side effects. You may report side effects to FDA at 1-800-FDA-1088. Where should I keep my medication? This medication is given in a hospital or clinic. It will not be stored at home. NOTE: This sheet is a summary. It may not cover all possible information. If you have questions about this medicine, talk to your doctor, pharmacist, or health care provider.  2023 Elsevier/Gold Standard (2007-09-07 00:00:00)  

## 2022-09-29 ENCOUNTER — Inpatient Hospital Stay: Payer: Managed Care, Other (non HMO) | Attending: Hematology and Oncology

## 2022-09-29 DIAGNOSIS — Z5111 Encounter for antineoplastic chemotherapy: Secondary | ICD-10-CM | POA: Insufficient documentation

## 2022-09-29 DIAGNOSIS — C50411 Malignant neoplasm of upper-outer quadrant of right female breast: Secondary | ICD-10-CM | POA: Insufficient documentation

## 2022-10-04 ENCOUNTER — Telehealth: Payer: Self-pay | Admitting: Hematology and Oncology

## 2022-10-04 NOTE — Telephone Encounter (Signed)
Scheduled and rescheduled appointments per patients request and room/resource to get appointments back aligned. Patient is aware of the changes made to her upcoming appointments.

## 2022-10-06 ENCOUNTER — Other Ambulatory Visit: Payer: Self-pay

## 2022-10-06 ENCOUNTER — Inpatient Hospital Stay: Payer: Managed Care, Other (non HMO)

## 2022-10-06 VITALS — BP 203/90 | HR 66 | Temp 99.3°F | Resp 20

## 2022-10-06 DIAGNOSIS — C50411 Malignant neoplasm of upper-outer quadrant of right female breast: Secondary | ICD-10-CM | POA: Diagnosis present

## 2022-10-06 DIAGNOSIS — Z5111 Encounter for antineoplastic chemotherapy: Secondary | ICD-10-CM | POA: Diagnosis present

## 2022-10-06 DIAGNOSIS — D509 Iron deficiency anemia, unspecified: Secondary | ICD-10-CM

## 2022-10-06 MED ORDER — GOSERELIN ACETATE 3.6 MG ~~LOC~~ IMPL
3.6000 mg | DRUG_IMPLANT | Freq: Once | SUBCUTANEOUS | Status: AC
  Administered 2022-10-06: 3.6 mg via SUBCUTANEOUS
  Filled 2022-10-06: qty 3.6

## 2022-10-06 NOTE — Patient Instructions (Signed)
Goserelin Implant What is this medication? GOSERELIN (GOE se rel in) treats prostate cancer and breast cancer. It works by decreasing levels of the hormones testosterone and estrogen in the body. This prevents prostate and breast cancer cells from spreading or growing. It may also be used to treat endometriosis. This is a condition where the tissue that lines the uterus grows outside the uterus. It works by decreasing the amount of estrogen your body makes, which reduces heavy bleeding and pain. It can also be used to help thin the lining of the uterus before a surgery used to prevent or reduce heavy periods. This medicine may be used for other purposes; ask your health care provider or pharmacist if you have questions. COMMON BRAND NAME(S): Zoladex, Zoladex 3-Month What should I tell my care team before I take this medication? They need to know if you have any of these conditions: Bone problems Diabetes Heart disease History of irregular heartbeat or rhythm An unusual or allergic reaction to goserelin, other medications, foods, dyes, or preservatives Pregnant or trying to get pregnant Breastfeeding How should I use this medication? This medication is injected under the skin. It is given by your care team in a hospital or clinic setting. Talk to your care team about the use of this medication in children. Special care may be needed. Overdosage: If you think you have taken too much of this medicine contact a poison control center or emergency room at once. NOTE: This medicine is only for you. Do not share this medicine with others. What if I miss a dose? Keep appointments for follow-up doses. It is important not to miss your dose. Call your care team if you are unable to keep an appointment. What may interact with this medication? Do not take this medication with any of the following: Cisapride Dronedarone Pimozide Thioridazine This medication may also interact with the following: Other  medications that cause heart rhythm changes This list may not describe all possible interactions. Give your health care provider a list of all the medicines, herbs, non-prescription drugs, or dietary supplements you use. Also tell them if you smoke, drink alcohol, or use illegal drugs. Some items may interact with your medicine. What should I watch for while using this medication? Visit your care team for regular checks on your progress. Your symptoms may appear to get worse during the first weeks of this therapy. Tell your care team if your symptoms do not start to get better or if they get worse after this time. Using this medication for a long time may weaken your bones. If you smoke or frequently drink alcohol you may increase your risk of bone loss. A family history of osteoporosis, chronic use of medications for seizures (convulsions), or corticosteroids can also increase your risk of bone loss. The risk of bone fractures may be increased. Talk to your care team about your bone health. This medication may increase blood sugar. The risk may be higher in patients who already have diabetes. Ask your care team what you can do to lower your risk of diabetes while taking this medication. This medication should stop regular monthly menstruation in women. Tell your care team if you continue to menstruate. Talk to your care team if you wish to become pregnant or think you might be pregnant. This medication can cause serious birth defects if taken during pregnancy or for 12 weeks after stopping treatment. Talk to your care team about reliable forms of contraception. Do not breastfeed while taking this   medication. This medication may cause infertility. Talk to your care team if you are concerned about your fertility. What side effects may I notice from receiving this medication? Side effects that you should report to your care team as soon as possible: Allergic reactions--skin rash, itching, hives, swelling  of the face, lips, tongue, or throat Change in the amount of urine Heart attack--pain or tightness in the chest, shoulders, arms, or jaw, nausea, shortness of breath, cold or clammy skin, feeling faint or lightheaded Heart rhythm changes--fast or irregular heartbeat, dizziness, feeling faint or lightheaded, chest pain, trouble breathing High blood sugar (hyperglycemia)--increased thirst or amount of urine, unusual weakness or fatigue, blurry vision High calcium level--increased thirst or amount of urine, nausea, vomiting, confusion, unusual weakness or fatigue, bone pain Pain, redness, irritation, or bruising at the injection site Severe back pain, numbness or weakness of the hands, arms, legs, or feet, loss of coordination, loss of bowel or bladder control Stroke--sudden numbness or weakness of the face, arm, or leg, trouble speaking, confusion, trouble walking, loss of balance or coordination, dizziness, severe headache, change in vision Swelling and pain of the tumor site or lymph nodes Trouble passing urine Side effects that usually do not require medical attention (report to your care team if they continue or are bothersome): Change in sex drive or performance Headache Hot flashes Rapid or extreme change in emotion or mood Sweating Swelling of the ankles, hands, or feet Unusual vaginal discharge, itching, or odor This list may not describe all possible side effects. Call your doctor for medical advice about side effects. You may report side effects to FDA at 1-800-FDA-1088. Where should I keep my medication? This medication is given in a hospital or clinic. It will not be stored at home. NOTE: This sheet is a summary. It may not cover all possible information. If you have questions about this medicine, talk to your doctor, pharmacist, or health care provider.  2023 Elsevier/Gold Standard (2021-11-30 00:00:00)  

## 2022-10-26 ENCOUNTER — Other Ambulatory Visit: Payer: Self-pay | Admitting: Hematology and Oncology

## 2022-10-27 ENCOUNTER — Inpatient Hospital Stay: Payer: Managed Care, Other (non HMO)

## 2022-11-03 ENCOUNTER — Other Ambulatory Visit: Payer: Self-pay

## 2022-11-03 ENCOUNTER — Inpatient Hospital Stay: Payer: Managed Care, Other (non HMO) | Attending: Hematology and Oncology

## 2022-11-03 VITALS — BP 168/73 | HR 60 | Temp 98.5°F | Resp 20

## 2022-11-03 DIAGNOSIS — C50411 Malignant neoplasm of upper-outer quadrant of right female breast: Secondary | ICD-10-CM | POA: Diagnosis present

## 2022-11-03 DIAGNOSIS — D509 Iron deficiency anemia, unspecified: Secondary | ICD-10-CM

## 2022-11-03 DIAGNOSIS — Z5111 Encounter for antineoplastic chemotherapy: Secondary | ICD-10-CM | POA: Diagnosis present

## 2022-11-03 MED ORDER — GOSERELIN ACETATE 3.6 MG ~~LOC~~ IMPL
3.6000 mg | DRUG_IMPLANT | Freq: Once | SUBCUTANEOUS | Status: AC
  Administered 2022-11-03: 3.6 mg via SUBCUTANEOUS
  Filled 2022-11-03: qty 3.6

## 2022-11-24 ENCOUNTER — Inpatient Hospital Stay: Payer: Managed Care, Other (non HMO)

## 2022-12-01 ENCOUNTER — Other Ambulatory Visit: Payer: Self-pay

## 2022-12-01 ENCOUNTER — Inpatient Hospital Stay: Payer: Managed Care, Other (non HMO) | Attending: Hematology and Oncology

## 2022-12-01 VITALS — BP 169/78 | HR 62 | Temp 98.2°F | Resp 18

## 2022-12-01 DIAGNOSIS — C50411 Malignant neoplasm of upper-outer quadrant of right female breast: Secondary | ICD-10-CM | POA: Insufficient documentation

## 2022-12-01 DIAGNOSIS — Z5111 Encounter for antineoplastic chemotherapy: Secondary | ICD-10-CM | POA: Diagnosis present

## 2022-12-01 DIAGNOSIS — Z79811 Long term (current) use of aromatase inhibitors: Secondary | ICD-10-CM | POA: Diagnosis not present

## 2022-12-01 DIAGNOSIS — Z170 Estrogen receptor positive status [ER+]: Secondary | ICD-10-CM | POA: Diagnosis not present

## 2022-12-01 DIAGNOSIS — D509 Iron deficiency anemia, unspecified: Secondary | ICD-10-CM

## 2022-12-01 MED ORDER — GOSERELIN ACETATE 3.6 MG ~~LOC~~ IMPL
3.6000 mg | DRUG_IMPLANT | Freq: Once | SUBCUTANEOUS | Status: AC
  Administered 2022-12-01: 3.6 mg via SUBCUTANEOUS
  Filled 2022-12-01: qty 3.6

## 2022-12-01 NOTE — Patient Instructions (Signed)
Goserelin Implant What is this medication? GOSERELIN (GOE se rel in) treats prostate cancer and breast cancer. It works by decreasing levels of the hormones testosterone and estrogen in the body. This prevents prostate and breast cancer cells from spreading or growing. It may also be used to treat endometriosis. This is a condition where the tissue that lines the uterus grows outside the uterus. It works by decreasing the amount of estrogen your body makes, which reduces heavy bleeding and pain. It can also be used to help thin the lining of the uterus before a surgery used to prevent or reduce heavy periods. This medicine may be used for other purposes; ask your health care provider or pharmacist if you have questions. COMMON BRAND NAME(S): Zoladex, Zoladex 3-Month What should I tell my care team before I take this medication? They need to know if you have any of these conditions: Bone problems Diabetes Heart disease History of irregular heartbeat or rhythm An unusual or allergic reaction to goserelin, other medications, foods, dyes, or preservatives Pregnant or trying to get pregnant Breastfeeding How should I use this medication? This medication is injected under the skin. It is given by your care team in a hospital or clinic setting. Talk to your care team about the use of this medication in children. Special care may be needed. Overdosage: If you think you have taken too much of this medicine contact a poison control center or emergency room at once. NOTE: This medicine is only for you. Do not share this medicine with others. What if I miss a dose? Keep appointments for follow-up doses. It is important not to miss your dose. Call your care team if you are unable to keep an appointment. What may interact with this medication? Do not take this medication with any of the following: Cisapride Dronedarone Pimozide Thioridazine This medication may also interact with the following: Other  medications that cause heart rhythm changes This list may not describe all possible interactions. Give your health care provider a list of all the medicines, herbs, non-prescription drugs, or dietary supplements you use. Also tell them if you smoke, drink alcohol, or use illegal drugs. Some items may interact with your medicine. What should I watch for while using this medication? Visit your care team for regular checks on your progress. Your symptoms may appear to get worse during the first weeks of this therapy. Tell your care team if your symptoms do not start to get better or if they get worse after this time. Using this medication for a long time may weaken your bones. If you smoke or frequently drink alcohol you may increase your risk of bone loss. A family history of osteoporosis, chronic use of medications for seizures (convulsions), or corticosteroids can also increase your risk of bone loss. The risk of bone fractures may be increased. Talk to your care team about your bone health. This medication may increase blood sugar. The risk may be higher in patients who already have diabetes. Ask your care team what you can do to lower your risk of diabetes while taking this medication. This medication should stop regular monthly menstruation in women. Tell your care team if you continue to menstruate. Talk to your care team if you wish to become pregnant or think you might be pregnant. This medication can cause serious birth defects if taken during pregnancy or for 12 weeks after stopping treatment. Talk to your care team about reliable forms of contraception. Do not breastfeed while taking this   medication. This medication may cause infertility. Talk to your care team if you are concerned about your fertility. What side effects may I notice from receiving this medication? Side effects that you should report to your care team as soon as possible: Allergic reactions--skin rash, itching, hives, swelling  of the face, lips, tongue, or throat Change in the amount of urine Heart attack--pain or tightness in the chest, shoulders, arms, or jaw, nausea, shortness of breath, cold or clammy skin, feeling faint or lightheaded Heart rhythm changes--fast or irregular heartbeat, dizziness, feeling faint or lightheaded, chest pain, trouble breathing High blood sugar (hyperglycemia)--increased thirst or amount of urine, unusual weakness or fatigue, blurry vision High calcium level--increased thirst or amount of urine, nausea, vomiting, confusion, unusual weakness or fatigue, bone pain Pain, redness, irritation, or bruising at the injection site Severe back pain, numbness or weakness of the hands, arms, legs, or feet, loss of coordination, loss of bowel or bladder control Stroke--sudden numbness or weakness of the face, arm, or leg, trouble speaking, confusion, trouble walking, loss of balance or coordination, dizziness, severe headache, change in vision Swelling and pain of the tumor site or lymph nodes Trouble passing urine Side effects that usually do not require medical attention (report to your care team if they continue or are bothersome): Change in sex drive or performance Headache Hot flashes Rapid or extreme change in emotion or mood Sweating Swelling of the ankles, hands, or feet Unusual vaginal discharge, itching, or odor This list may not describe all possible side effects. Call your doctor for medical advice about side effects. You may report side effects to FDA at 1-800-FDA-1088. Where should I keep my medication? This medication is given in a hospital or clinic. It will not be stored at home. NOTE: This sheet is a summary. It may not cover all possible information. If you have questions about this medicine, talk to your doctor, pharmacist, or health care provider.  2023 Elsevier/Gold Standard (2021-11-30 00:00:00)  

## 2022-12-21 ENCOUNTER — Inpatient Hospital Stay: Payer: Managed Care, Other (non HMO) | Admitting: Hematology and Oncology

## 2022-12-21 ENCOUNTER — Inpatient Hospital Stay: Payer: Managed Care, Other (non HMO)

## 2022-12-22 NOTE — Progress Notes (Signed)
Patient Care Team: Plotnikov, Georgina Quint, MD as PCP - General  DIAGNOSIS: No diagnosis found.  SUMMARY OF ONCOLOGIC HISTORY: Oncology History  Cancer of upper-outer quadrant of female breast ([facility])  08/09/2012 Initial Diagnosis   Right breast biopsy 10 o'clock position: Invasive lobular cancer, ALH, grade 1-2, ER 99%, PR 99%, Ki-67 13%, HER-2 negative ratio 1.3   09/26/2012 -  Anti-estrogen oral therapy   Zoladex with anastrozole prescribed at [facility] [place] along with lots of herbs.  Annual mammograms and MRIs showed stable findings.  Patient never had surgery     CHIEF COMPLIANT: Follow-up of right breast cancer on Zoladex and anastrozole   INTERVAL HISTORY: Jasmine Stafford is a 57 y.o. with above-mentioned history of right breast cancer currently on treatment with Zoladex and anastrozole. She presents to the clinic today for a annual follow-up.     ALLERGIES:  is allergic to olmesartan, other, shellfish allergy, erythromycin, moxifloxacin, penicillins, and tequin [gatifloxacin].  MEDICATIONS:  Current Outpatient Medications  Medication Sig Dispense Refill   anastrozole (ARIMIDEX) 1 MG tablet TAKE 1 TABLET BY MOUTH EVERY DAY 90 tablet 3   Ascorbic Acid (VITAMIN C) 1000 MG tablet Take 3,000 mg by mouth daily.      Calcium Carb-Cholecalciferol 600-200 MG-UNIT TABS Take 1 tablet by mouth daily.     Cholecalciferol (VITAMIN D3) 10000 UNITS capsule Take 10,000 Units by mouth daily.     Coenzyme Q10 (COQ10) 400 MG CAPS Take 400 mg by mouth daily.     DANDELION PO Take 2 capsules by mouth daily.      Digestive Enzymes CAPS Take 2 capsules by mouth 3 (three) times daily with meals.     ferrous gluconate (FERGON) 325 MG tablet Take 1 tablet (325 mg total) by mouth 3 (three) times daily with meals. 30 tablet 0   GARLIC PO Take 3 capsules by mouth daily.     hydrALAZINE (APRESOLINE) 100 MG tablet Take 1 tablet (100 mg total) by mouth 3 (three) times daily. 270  tablet 3   hydrochlorothiazide (HYDRODIURIL) 25 MG tablet Take 1 tablet (25 mg total) by mouth daily. 90 tablet 3   ipratropium-albuterol (DUONEB) 0.5-2.5 (3) MG/3ML SOLN Take 3 mLs by nebulization 2 (two) times daily. (Patient taking differently: Take 3 mLs by nebulization daily as needed (wheezing).) 360 mL 0   labetalol (NORMODYNE) 300 MG tablet Take 2 tablets (600 mg total) by mouth 2 (two) times daily. Overdue for physical w/ labs must see provider for refills 360 tablet 3   Lactobacillus (PROBIOTIC ACIDOPHILUS PO) Take 2 tablets by mouth daily.     Melatonin 10 MG TABS Take 20 mg by mouth at bedtime as needed (sleep).      Multiple Vitamin (MULTIVITAMIN WITH MINERALS) TABS tablet Take 1 tablet by mouth at bedtime.     Multiple Vitamins-Iron (CHLORELLA PO) Take 4 g by mouth daily.     potassium chloride SA (KLOR-CON M) 20 MEQ tablet Take 1 tablet (20 mEq total) by mouth daily. 90 tablet 3   Turmeric POWD Take 7.5 mLs by mouth at bedtime.      No current facility-administered medications for this visit.    PHYSICAL EXAMINATION: ECOG PERFORMANCE STATUS: {CHL ONC ECOG PS:[phone]}  There were no vitals filed for this visit. There were no vitals filed for this visit.  BREAST:*** No palpable masses or nodules in either right or left breasts. No palpable axillary supraclavicular or infraclavicular adenopathy no breast tenderness or nipple discharge. (  exam performed in the presence of a chaperone)  LABORATORY DATA:  I have reviewed the data as listed    Latest Ref Rng & Units 08/21/2022    4:53 PM 06/02/2021   11:40 AM 01/12/2020    9:59 AM  CMP  Glucose 70 - 99 mg/dL 98  85  96   BUN 6 - 23 mg/dL 19  15  17    Creatinine 0.40 - 1.20 mg/dL 1.61  0.96  0.45   Sodium 135 - 145 mEq/L 140  141  142   Potassium 3.5 - 5.1 mEq/L 3.3  3.6  3.6   Chloride 96 - 112 mEq/L 100  105  104   CO2 19 - 32 mEq/L 31  31  30    Calcium 8.4 - 10.5 mg/dL 9.5  9.4  9.6   Total Protein 6.0 - 8.3 g/dL 7.8   7.6  7.6   Total Bilirubin 0.2 - 1.2 mg/dL 0.4  0.6  0.4   Alkaline Phos 39 - 117 U/L 82  104  81   AST 0 - 37 U/L 20  21  30    ALT 0 - 35 U/L 18  23  25      Lab Results  Component Value Date   WBC 9.5 08/21/2022   HGB 12.6 08/21/2022   HCT 38.4 08/21/2022   MCV 90.3 08/21/2022   PLT 266.0 08/21/2022   NEUTROABS 6.0 08/21/2022    ASSESSMENT & PLAN:  No problem-specific Assessment & Plan notes found for this encounter.    No orders of the defined types were placed in this encounter.  The patient has a good understanding of the overall plan. she agrees with it. she will call with any problems that may develop before the next visit here. Total time spent: 30 mins including face to face time and time spent for planning, charting and co-ordination of care   Sherlyn Lick, CMA 12/22/22    I Janan Ridge am acting as a Neurosurgeon for [employer]  ***

## 2022-12-28 ENCOUNTER — Inpatient Hospital Stay: Payer: Managed Care, Other (non HMO)

## 2022-12-28 ENCOUNTER — Inpatient Hospital Stay: Payer: Managed Care, Other (non HMO) | Admitting: Hematology and Oncology

## 2022-12-28 VITALS — BP 186/66 | HR 77 | Temp 97.2°F | Resp 18 | Ht 64.0 in | Wt 310.5 lb

## 2022-12-28 DIAGNOSIS — Z5111 Encounter for antineoplastic chemotherapy: Secondary | ICD-10-CM | POA: Diagnosis not present

## 2022-12-28 DIAGNOSIS — C50411 Malignant neoplasm of upper-outer quadrant of right female breast: Secondary | ICD-10-CM | POA: Diagnosis not present

## 2022-12-28 DIAGNOSIS — Z170 Estrogen receptor positive status [ER+]: Secondary | ICD-10-CM | POA: Diagnosis not present

## 2022-12-28 DIAGNOSIS — D509 Iron deficiency anemia, unspecified: Secondary | ICD-10-CM

## 2022-12-28 MED ORDER — GOSERELIN ACETATE 3.6 MG ~~LOC~~ IMPL
3.6000 mg | DRUG_IMPLANT | Freq: Once | SUBCUTANEOUS | Status: AC
  Administered 2022-12-28: 3.6 mg via SUBCUTANEOUS
  Filled 2022-12-28: qty 3.6

## 2022-12-28 NOTE — Assessment & Plan Note (Signed)
Right breast invasive lobular cancer grade 1-2, ER 99%, PR 99%, HER-2 negative Ratio 1.3, Ki-67 13%, initial MRI revealed 7.1 cm area of mass or non-mass enhancement. Current treatment: Zoladex (noncompliant, last given March 2019 but had not received many doses), anastrozole 1 mg daily Patient never had surgery because she does not wish to undergo mastectomy.   Breast MRI 07/04/2019: Significant decrease in the previous demonstrated non-mass enhancement UOQ right breast with the exception of 9 mmNME 8:30 position, interval decrease in background enhancement involving all 4 quadrants.  Left breast normal   Current Treatment: Zoladex with anastrozole started June 2020 (originally started in 2014) She continues with her herbal supplements and other medication      Surveillance: 1.  Mammogram 02/08/2022: The known right breast cancer at 10 o'clock position stable (2.7 cm previously was 2.4 cm: Borders are indistinct and therefore precise measurement is difficult) 2. breast MRI 07/09/20: Dec NME UOQ rt breast. Dec background enh, stable 0.8 cm LOQ Rt breast NME 3.  Breast exam: 12/28/2022: Benign     RTC in 1 year

## 2023-01-04 ENCOUNTER — Telehealth: Payer: Self-pay | Admitting: Hematology and Oncology

## 2023-01-04 NOTE — Telephone Encounter (Signed)
Scheduled appointments per 5/30 los. Patient is aware of all made appointments.

## 2023-01-25 ENCOUNTER — Other Ambulatory Visit: Payer: Self-pay

## 2023-01-25 ENCOUNTER — Inpatient Hospital Stay: Payer: Managed Care, Other (non HMO) | Attending: Hematology and Oncology

## 2023-01-25 VITALS — BP 129/61 | HR 63 | Temp 98.4°F | Resp 18

## 2023-01-25 DIAGNOSIS — Z5111 Encounter for antineoplastic chemotherapy: Secondary | ICD-10-CM | POA: Insufficient documentation

## 2023-01-25 DIAGNOSIS — C50411 Malignant neoplasm of upper-outer quadrant of right female breast: Secondary | ICD-10-CM | POA: Diagnosis present

## 2023-01-25 DIAGNOSIS — D509 Iron deficiency anemia, unspecified: Secondary | ICD-10-CM

## 2023-01-25 MED ORDER — GOSERELIN ACETATE 3.6 MG ~~LOC~~ IMPL
3.6000 mg | DRUG_IMPLANT | Freq: Once | SUBCUTANEOUS | Status: AC
  Administered 2023-01-25: 3.6 mg via SUBCUTANEOUS
  Filled 2023-01-25: qty 3.6

## 2023-02-12 ENCOUNTER — Ambulatory Visit: Payer: Managed Care, Other (non HMO)

## 2023-02-12 ENCOUNTER — Ambulatory Visit
Admission: RE | Admit: 2023-02-12 | Discharge: 2023-02-12 | Disposition: A | Discharge status: Home / Self Care | Payer: Managed Care, Other (non HMO) | Source: Ambulatory Visit | Attending: Hematology and Oncology

## 2023-02-12 DIAGNOSIS — Z170 Estrogen receptor positive status [ER+]: Secondary | ICD-10-CM

## 2023-02-22 ENCOUNTER — Other Ambulatory Visit: Payer: Self-pay

## 2023-02-22 ENCOUNTER — Inpatient Hospital Stay: Payer: Managed Care, Other (non HMO) | Attending: Hematology and Oncology

## 2023-02-22 VITALS — BP 172/79 | HR 68 | Temp 98.5°F | Resp 18

## 2023-02-22 DIAGNOSIS — C50411 Malignant neoplasm of upper-outer quadrant of right female breast: Secondary | ICD-10-CM | POA: Diagnosis present

## 2023-02-22 DIAGNOSIS — Z5111 Encounter for antineoplastic chemotherapy: Secondary | ICD-10-CM | POA: Insufficient documentation

## 2023-02-22 DIAGNOSIS — D509 Iron deficiency anemia, unspecified: Secondary | ICD-10-CM

## 2023-02-22 MED ORDER — GOSERELIN ACETATE 3.6 MG ~~LOC~~ IMPL
3.6000 mg | DRUG_IMPLANT | Freq: Once | SUBCUTANEOUS | Status: AC
  Administered 2023-02-22: 3.6 mg via SUBCUTANEOUS
  Filled 2023-02-22: qty 3.6

## 2023-03-22 ENCOUNTER — Inpatient Hospital Stay: Payer: Managed Care, Other (non HMO) | Attending: Hematology and Oncology

## 2023-03-22 VITALS — BP 167/81 | HR 59 | Temp 98.7°F | Resp 20

## 2023-03-22 DIAGNOSIS — C50411 Malignant neoplasm of upper-outer quadrant of right female breast: Secondary | ICD-10-CM | POA: Insufficient documentation

## 2023-03-22 DIAGNOSIS — Z5111 Encounter for antineoplastic chemotherapy: Secondary | ICD-10-CM | POA: Diagnosis present

## 2023-03-22 DIAGNOSIS — D509 Iron deficiency anemia, unspecified: Secondary | ICD-10-CM

## 2023-03-22 MED ORDER — GOSERELIN ACETATE 3.6 MG ~~LOC~~ IMPL
3.6000 mg | DRUG_IMPLANT | Freq: Once | SUBCUTANEOUS | Status: AC
  Administered 2023-03-22: 3.6 mg via SUBCUTANEOUS
  Filled 2023-03-22: qty 3.6

## 2023-04-19 ENCOUNTER — Inpatient Hospital Stay: Payer: Managed Care, Other (non HMO) | Attending: Hematology and Oncology

## 2023-04-19 VITALS — BP 195/90 | HR 58 | Temp 98.8°F | Resp 18

## 2023-04-19 DIAGNOSIS — Z5111 Encounter for antineoplastic chemotherapy: Secondary | ICD-10-CM | POA: Diagnosis present

## 2023-04-19 DIAGNOSIS — C50411 Malignant neoplasm of upper-outer quadrant of right female breast: Secondary | ICD-10-CM | POA: Diagnosis present

## 2023-04-19 DIAGNOSIS — D509 Iron deficiency anemia, unspecified: Secondary | ICD-10-CM

## 2023-04-19 MED ORDER — GOSERELIN ACETATE 3.6 MG ~~LOC~~ IMPL
3.6000 mg | DRUG_IMPLANT | Freq: Once | SUBCUTANEOUS | Status: AC
  Administered 2023-04-19: 3.6 mg via SUBCUTANEOUS
  Filled 2023-04-19: qty 3.6

## 2023-05-04 ENCOUNTER — Other Ambulatory Visit: Payer: Self-pay | Admitting: Internal Medicine

## 2023-05-04 DIAGNOSIS — Z1211 Encounter for screening for malignant neoplasm of colon: Secondary | ICD-10-CM

## 2023-05-04 DIAGNOSIS — Z1212 Encounter for screening for malignant neoplasm of rectum: Secondary | ICD-10-CM

## 2023-05-17 ENCOUNTER — Inpatient Hospital Stay: Payer: Managed Care, Other (non HMO) | Attending: Hematology and Oncology

## 2023-05-17 VITALS — BP 185/76 | HR 61 | Temp 98.5°F | Resp 18

## 2023-05-17 DIAGNOSIS — Z5111 Encounter for antineoplastic chemotherapy: Secondary | ICD-10-CM | POA: Diagnosis present

## 2023-05-17 DIAGNOSIS — C50411 Malignant neoplasm of upper-outer quadrant of right female breast: Secondary | ICD-10-CM | POA: Insufficient documentation

## 2023-05-17 DIAGNOSIS — D509 Iron deficiency anemia, unspecified: Secondary | ICD-10-CM

## 2023-05-17 MED ORDER — GOSERELIN ACETATE 3.6 MG ~~LOC~~ IMPL
3.6000 mg | DRUG_IMPLANT | Freq: Once | SUBCUTANEOUS | Status: AC
  Administered 2023-05-17: 3.6 mg via SUBCUTANEOUS
  Filled 2023-05-17: qty 3.6

## 2023-05-17 NOTE — Patient Instructions (Signed)
Goserelin Implant What is this medication? GOSERELIN (GOE se rel in) treats prostate cancer and breast cancer. It works by decreasing levels of the hormones testosterone and estrogen in the body. This prevents prostate and breast cancer cells from spreading or growing. It may also be used to treat endometriosis. This is a condition where the tissue that lines the uterus grows outside the uterus. It works by decreasing the amount of estrogen your body makes, which reduces heavy bleeding and pain. It can also be used to help thin the lining of the uterus before a surgery used to prevent or reduce heavy periods. This medicine may be used for other purposes; ask your health care provider or pharmacist if you have questions. COMMON BRAND NAME(S): Zoladex, Zoladex 64-Month What should I tell my care team before I take this medication? They need to know if you have any of these conditions: Bone problems Diabetes Heart disease History of irregular heartbeat or rhythm An unusual or allergic reaction to goserelin, other medications, foods, dyes, or preservatives Pregnant or trying to get pregnant Breastfeeding How should I use this medication? This medication is injected under the skin. It is given by your care team in a hospital or clinic setting. Talk to your care team about the use of this medication in children. Special care may be needed. Overdosage: If you think you have taken too much of this medicine contact a poison control center or emergency room at once. NOTE: This medicine is only for you. Do not share this medicine with others. What if I miss a dose? Keep appointments for follow-up doses. It is important not to miss your dose. Call your care team if you are unable to keep an appointment. What may interact with this medication? Do not take this medication with any of the following: Cisapride Dronedarone Pimozide Thioridazine This medication may also interact with the following: Other  medications that cause heart rhythm changes This list may not describe all possible interactions. Give your health care provider a list of all the medicines, herbs, non-prescription drugs, or dietary supplements you use. Also tell them if you smoke, drink alcohol, or use illegal drugs. Some items may interact with your medicine. What should I watch for while using this medication? Visit your care team for regular checks on your progress. Your symptoms may appear to get worse during the first weeks of this therapy. Tell your care team if your symptoms do not start to get better or if they get worse after this time. Using this medication for a long time may weaken your bones. If you smoke or frequently drink alcohol you may increase your risk of bone loss. A family history of osteoporosis, chronic use of medications for seizures (convulsions), or corticosteroids can also increase your risk of bone loss. The risk of bone fractures may be increased. Talk to your care team about your bone health. This medication may increase blood sugar. The risk may be higher in patients who already have diabetes. Ask your care team what you can do to lower your risk of diabetes while taking this medication. This medication should stop regular monthly menstruation in women. Tell your care team if you continue to menstruate. Talk to your care team if you wish to become pregnant or think you might be pregnant. This medication can cause serious birth defects if taken during pregnancy or for 12 weeks after stopping treatment. Talk to your care team about reliable forms of contraception. Do not breastfeed while taking this  medication. This medication may cause infertility. Talk to your care team if you are concerned about your fertility. What side effects may I notice from receiving this medication? Side effects that you should report to your care team as soon as possible: Allergic reactions--skin rash, itching, hives, swelling  of the face, lips, tongue, or throat Change in the amount of urine Heart attack--pain or tightness in the chest, shoulders, arms, or jaw, nausea, shortness of breath, cold or clammy skin, feeling faint or lightheaded Heart rhythm changes--fast or irregular heartbeat, dizziness, feeling faint or lightheaded, chest pain, trouble breathing High blood sugar (hyperglycemia)--increased thirst or amount of urine, unusual weakness or fatigue, blurry vision High calcium level--increased thirst or amount of urine, nausea, vomiting, confusion, unusual weakness or fatigue, bone pain Pain, redness, irritation, or bruising at the injection site Severe back pain, numbness or weakness of the hands, arms, legs, or feet, loss of coordination, loss of bowel or bladder control Stroke--sudden numbness or weakness of the face, arm, or leg, trouble speaking, confusion, trouble walking, loss of balance or coordination, dizziness, severe headache, change in vision Swelling and pain of the tumor site or lymph nodes Trouble passing urine Side effects that usually do not require medical attention (report to your care team if they continue or are bothersome): Change in sex drive or performance Headache Hot flashes Rapid or extreme change in emotion or mood Sweating Swelling of the ankles, hands, or feet Unusual vaginal discharge, itching, or odor This list may not describe all possible side effects. Call your doctor for medical advice about side effects. You may report side effects to FDA at 1-800-FDA-1088. Where should I keep my medication? This medication is given in a hospital or clinic. It will not be stored at home. NOTE: This sheet is a summary. It may not cover all possible information. If you have questions about this medicine, talk to your doctor, pharmacist, or health care provider.  2024 [employer]vier/Gold Standard (2021-12-08 00:00:00)

## 2023-06-01 ENCOUNTER — Telehealth: Payer: Self-pay | Admitting: *Deleted

## 2023-06-01 NOTE — Telephone Encounter (Signed)
Call received from Andres Ege Nursing Case Manager [phone]), regarding specialty medication.  "Mahalie Kanner, [birth year] is to receive Zoladex injections.  Redge Gainer is Optician, dispensing".  Would like to speak with someone to discuss other pathways."  This forms supportive medication PA nurse unable to assist.  Noted patient scheduled at [facility] for next Zoladex on 06/14/2023.  Call transferred to infusion pharmacy [phone]).

## 2023-06-14 ENCOUNTER — Inpatient Hospital Stay: Payer: Managed Care, Other (non HMO) | Attending: Hematology and Oncology

## 2023-06-14 VITALS — BP 162/76 | HR 70 | Temp 98.1°F | Resp 18

## 2023-06-14 DIAGNOSIS — Z5111 Encounter for antineoplastic chemotherapy: Secondary | ICD-10-CM | POA: Insufficient documentation

## 2023-06-14 DIAGNOSIS — C50411 Malignant neoplasm of upper-outer quadrant of right female breast: Secondary | ICD-10-CM | POA: Insufficient documentation

## 2023-06-14 DIAGNOSIS — D509 Iron deficiency anemia, unspecified: Secondary | ICD-10-CM

## 2023-06-14 MED ORDER — GOSERELIN ACETATE 3.6 MG ~~LOC~~ IMPL
3.6000 mg | DRUG_IMPLANT | Freq: Once | SUBCUTANEOUS | Status: AC
  Administered 2023-06-14: 3.6 mg via SUBCUTANEOUS
  Filled 2023-06-14: qty 3.6

## 2023-06-26 ENCOUNTER — Telehealth: Payer: Self-pay | Admitting: Internal Medicine

## 2023-06-26 NOTE — Telephone Encounter (Signed)
ERROR

## 2023-07-12 ENCOUNTER — Inpatient Hospital Stay: Payer: Managed Care, Other (non HMO) | Attending: Hematology and Oncology

## 2023-07-12 VITALS — BP 216/83 | HR 65 | Resp 18

## 2023-07-12 DIAGNOSIS — Z5111 Encounter for antineoplastic chemotherapy: Secondary | ICD-10-CM | POA: Insufficient documentation

## 2023-07-12 DIAGNOSIS — D509 Iron deficiency anemia, unspecified: Secondary | ICD-10-CM

## 2023-07-12 DIAGNOSIS — C50411 Malignant neoplasm of upper-outer quadrant of right female breast: Secondary | ICD-10-CM | POA: Insufficient documentation

## 2023-07-12 MED ORDER — GOSERELIN ACETATE 3.6 MG ~~LOC~~ IMPL
3.6000 mg | DRUG_IMPLANT | Freq: Once | SUBCUTANEOUS | Status: AC
  Administered 2023-07-12: 3.6 mg via SUBCUTANEOUS
  Filled 2023-07-12: qty 3.6

## 2023-07-30 ENCOUNTER — Encounter: Payer: Self-pay | Admitting: Hematology and Oncology

## 2023-08-09 ENCOUNTER — Inpatient Hospital Stay: Payer: Managed Care, Other (non HMO)

## 2023-08-09 ENCOUNTER — Inpatient Hospital Stay: Payer: Managed Care, Other (non HMO) | Attending: Hematology and Oncology

## 2023-08-09 VITALS — BP 177/69 | HR 64 | Temp 98.7°F | Resp 18

## 2023-08-09 DIAGNOSIS — C50411 Malignant neoplasm of upper-outer quadrant of right female breast: Secondary | ICD-10-CM | POA: Diagnosis present

## 2023-08-09 DIAGNOSIS — Z5111 Encounter for antineoplastic chemotherapy: Secondary | ICD-10-CM | POA: Diagnosis present

## 2023-08-09 DIAGNOSIS — D509 Iron deficiency anemia, unspecified: Secondary | ICD-10-CM

## 2023-08-09 MED ORDER — GOSERELIN ACETATE 3.6 MG ~~LOC~~ IMPL
3.6000 mg | DRUG_IMPLANT | Freq: Once | SUBCUTANEOUS | Status: AC
  Administered 2023-08-09: 3.6 mg via SUBCUTANEOUS
  Filled 2023-08-09: qty 3.6

## 2023-09-06 ENCOUNTER — Inpatient Hospital Stay: Payer: Managed Care, Other (non HMO) | Attending: Hematology and Oncology

## 2023-09-06 VITALS — BP 221/83 | HR 52 | Resp 20

## 2023-09-06 DIAGNOSIS — Z5111 Encounter for antineoplastic chemotherapy: Secondary | ICD-10-CM | POA: Diagnosis present

## 2023-09-06 DIAGNOSIS — C50411 Malignant neoplasm of upper-outer quadrant of right female breast: Secondary | ICD-10-CM | POA: Diagnosis present

## 2023-09-06 DIAGNOSIS — D509 Iron deficiency anemia, unspecified: Secondary | ICD-10-CM

## 2023-09-06 MED ORDER — GOSERELIN ACETATE 3.6 MG ~~LOC~~ IMPL
3.6000 mg | DRUG_IMPLANT | Freq: Once | SUBCUTANEOUS | Status: AC
  Administered 2023-09-06: 3.6 mg via SUBCUTANEOUS
  Filled 2023-09-06: qty 3.6

## 2023-09-06 NOTE — Progress Notes (Signed)
 Pt. Here for Zoladex  injection.  Blood pressure is elevated.  States she has medication for her blood pressure, but she last took her medication this morning.  She wasn't able to take her one blood pressure mediation that is prescribed for TID today due to her not being able to eat lunch.  States the medication makes her sick on an empty stomach.  Pt. Advised that her blood pressure readings are extremely high and that she needs to find a way to take her medication TID and to follow up with her PCP.  States her PCP is aware of her elevated blood pressure readings.

## 2023-09-11 LAB — COLOGUARD

## 2023-09-12 ENCOUNTER — Encounter: Payer: Self-pay | Admitting: Internal Medicine

## 2023-10-02 ENCOUNTER — Other Ambulatory Visit: Payer: Self-pay | Admitting: Internal Medicine

## 2023-10-02 NOTE — Telephone Encounter (Signed)
 Schedule office visit

## 2023-10-04 ENCOUNTER — Inpatient Hospital Stay: Payer: Managed Care, Other (non HMO) | Attending: Hematology and Oncology

## 2023-10-04 VITALS — BP 170/81 | HR 56 | Temp 98.6°F | Resp 18

## 2023-10-04 DIAGNOSIS — Z5111 Encounter for antineoplastic chemotherapy: Secondary | ICD-10-CM | POA: Diagnosis present

## 2023-10-04 DIAGNOSIS — D509 Iron deficiency anemia, unspecified: Secondary | ICD-10-CM

## 2023-10-04 DIAGNOSIS — C50411 Malignant neoplasm of upper-outer quadrant of right female breast: Secondary | ICD-10-CM | POA: Insufficient documentation

## 2023-10-04 MED ORDER — GOSERELIN ACETATE 3.6 MG ~~LOC~~ IMPL
3.6000 mg | DRUG_IMPLANT | Freq: Once | SUBCUTANEOUS | Status: AC
  Administered 2023-10-04: 3.6 mg via SUBCUTANEOUS
  Filled 2023-10-04: qty 3.6

## 2023-10-17 ENCOUNTER — Other Ambulatory Visit: Payer: Self-pay | Admitting: Internal Medicine

## 2023-11-01 ENCOUNTER — Other Ambulatory Visit: Payer: Self-pay

## 2023-11-01 ENCOUNTER — Inpatient Hospital Stay: Payer: Managed Care, Other (non HMO) | Attending: Hematology and Oncology

## 2023-11-01 VITALS — BP 151/68 | HR 62 | Temp 98.5°F | Resp 18

## 2023-11-01 DIAGNOSIS — Z5111 Encounter for antineoplastic chemotherapy: Secondary | ICD-10-CM | POA: Insufficient documentation

## 2023-11-01 DIAGNOSIS — D509 Iron deficiency anemia, unspecified: Secondary | ICD-10-CM

## 2023-11-01 DIAGNOSIS — C50411 Malignant neoplasm of upper-outer quadrant of right female breast: Secondary | ICD-10-CM | POA: Insufficient documentation

## 2023-11-01 MED ORDER — GOSERELIN ACETATE 3.6 MG ~~LOC~~ IMPL
3.6000 mg | DRUG_IMPLANT | Freq: Once | SUBCUTANEOUS | Status: AC
  Administered 2023-11-01: 3.6 mg via SUBCUTANEOUS
  Filled 2023-11-01: qty 3.6

## 2023-11-15 ENCOUNTER — Other Ambulatory Visit: Payer: Self-pay | Admitting: Internal Medicine

## 2023-11-29 ENCOUNTER — Inpatient Hospital Stay: Payer: Managed Care, Other (non HMO) | Attending: Hematology and Oncology

## 2023-11-29 VITALS — BP 181/76 | HR 55 | Temp 98.4°F | Resp 17

## 2023-11-29 DIAGNOSIS — Z5111 Encounter for antineoplastic chemotherapy: Secondary | ICD-10-CM | POA: Insufficient documentation

## 2023-11-29 DIAGNOSIS — C50411 Malignant neoplasm of upper-outer quadrant of right female breast: Secondary | ICD-10-CM | POA: Insufficient documentation

## 2023-11-29 DIAGNOSIS — D509 Iron deficiency anemia, unspecified: Secondary | ICD-10-CM

## 2023-11-29 MED ORDER — GOSERELIN ACETATE 3.6 MG ~~LOC~~ IMPL
3.6000 mg | DRUG_IMPLANT | Freq: Once | SUBCUTANEOUS | Status: AC
  Administered 2023-11-29: 3.6 mg via SUBCUTANEOUS
  Filled 2023-11-29: qty 3.6

## 2023-12-11 ENCOUNTER — Emergency Department (HOSPITAL_COMMUNITY)

## 2023-12-11 ENCOUNTER — Emergency Department (HOSPITAL_COMMUNITY)
Admission: EM | Admit: 2023-12-11 | Discharge: 2023-12-11 | Disposition: A | Discharge status: Home / Self Care | Attending: Emergency Medicine | Admitting: Emergency Medicine

## 2023-12-11 ENCOUNTER — Other Ambulatory Visit: Payer: Self-pay

## 2023-12-11 ENCOUNTER — Encounter (HOSPITAL_COMMUNITY): Payer: Self-pay | Admitting: Pharmacy Technician

## 2023-12-11 DIAGNOSIS — R7989 Other specified abnormal findings of blood chemistry: Secondary | ICD-10-CM | POA: Insufficient documentation

## 2023-12-11 DIAGNOSIS — I10 Essential (primary) hypertension: Secondary | ICD-10-CM | POA: Insufficient documentation

## 2023-12-11 DIAGNOSIS — Z853 Personal history of malignant neoplasm of breast: Secondary | ICD-10-CM | POA: Insufficient documentation

## 2023-12-11 DIAGNOSIS — Z79899 Other long term (current) drug therapy: Secondary | ICD-10-CM | POA: Insufficient documentation

## 2023-12-11 DIAGNOSIS — R079 Chest pain, unspecified: Secondary | ICD-10-CM | POA: Diagnosis present

## 2023-12-11 LAB — CBC
HCT: 36.2 % (ref 36.0–46.0)
Hemoglobin: 11.3 g/dL — ABNORMAL LOW (ref 12.0–15.0)
MCH: 29.1 pg (ref 26.0–34.0)
MCHC: 31.2 g/dL (ref 30.0–36.0)
MCV: 93.3 fL (ref 80.0–100.0)
Platelets: 251 10*3/uL (ref 150–400)
RBC: 3.88 MIL/uL (ref 3.87–5.11)
RDW: 14.5 % (ref 11.5–15.5)
WBC: 10.3 10*3/uL (ref 4.0–10.5)
nRBC: 0 % (ref 0.0–0.2)

## 2023-12-11 LAB — BRAIN NATRIURETIC PEPTIDE: B Natriuretic Peptide: 75.2 pg/mL (ref 0.0–100.0)

## 2023-12-11 LAB — BASIC METABOLIC PANEL WITH GFR
Anion gap: 9 (ref 5–15)
BUN: 16 mg/dL (ref 6–20)
CO2: 27 mmol/L (ref 22–32)
Calcium: 9.1 mg/dL (ref 8.9–10.3)
Chloride: 104 mmol/L (ref 98–111)
Creatinine, Ser: 1.23 mg/dL — ABNORMAL HIGH (ref 0.44–1.00)
GFR, Estimated: 52 mL/min — ABNORMAL LOW (ref >60)
Glucose, Bld: 98 mg/dL (ref 70–99)
Potassium: 3.5 mmol/L (ref 3.5–5.1)
Sodium: 140 mmol/L (ref 135–145)

## 2023-12-11 LAB — TROPONIN I (HIGH SENSITIVITY)
Troponin I (High Sensitivity): 4 ng/L (ref <18)
Troponin I (High Sensitivity): 5 ng/L (ref <18)

## 2023-12-11 LAB — HCG, SERUM, QUALITATIVE: Preg, Serum: NEGATIVE

## 2023-12-11 MED ORDER — IOHEXOL 350 MG/ML SOLN
75.0000 mL | Freq: Once | INTRAVENOUS | Status: AC | PRN
  Administered 2023-12-11: 75 mL via INTRAVENOUS

## 2023-12-11 NOTE — Discharge Instructions (Addendum)
 You were evaluated in the emergency room for chest pain.  Your lab work and imaging did not show any significant abnormality.  If your symptoms persist please follow with your primary care doctor.

## 2023-12-11 NOTE — ED Triage Notes (Signed)
 Pt here with chest pain, worse with deep inspiration onset after waking up today. Now with a dull ache to her back beneath the shoulder blade.

## 2023-12-11 NOTE — ED Notes (Signed)
Ambulatory to RR

## 2023-12-11 NOTE — ED Provider Notes (Signed)
 [place] EMERGENCY DEPARTMENT AT [facility] Provider Note   CSN: [id number] Arrival date & time: 12/11/23  1407     History  Chief Complaint  Patient presents with   Chest Pain    Jasmine Stafford is a 58 y.o. female history of hypertension, breast cancer not on chemo or radiation presents with complaints of chest pain that is worse with respirations.  Does not feel short of breath with this.  Seems to be worse with lying down.  No cardiac history or prior blood clots.   Chest Pain     Past Medical History:  Diagnosis Date   Acute kidney injury (HCC)    from severe bladder infection    Allergy    Anemia    Breast cancer (HCC) 2014   Right Invasive Lobular - no chemo, no radiation    Genital warts    History of chicken pox    HTN (hypertension)    Neuromuscular disorder (HCC)    Sleep apnea    uses Cpap    Status post blood transfusion without reported diagnosis 1998, 2002, 2005 x2     Home Medications Prior to Admission medications   Medication Sig Start Date End Date Taking? Authorizing Provider  anastrozole  (ARIMIDEX ) 1 MG tablet TAKE 1 TABLET BY MOUTH EVERY DAY 10/26/22   Gudena, Vinay, MD  Ascorbic Acid (VITAMIN C) 1000 MG tablet Take 3,000 mg by mouth daily.     [provider]  Calcium  Carb-Cholecalciferol  600-200 MG-UNIT TABS Take 1 tablet by mouth daily.    [provider]  Cholecalciferol  (VITAMIN D3) 10000 UNITS capsule Take 10,000 Units by mouth daily.    [provider]  Coenzyme Q10 (COQ10) 400 MG CAPS Take 400 mg by mouth daily.    [provider]  DANDELION PO Take 2 capsules by mouth daily.     [provider]  Digestive Enzymes CAPS Take 2 capsules by mouth 3 (three) times daily with meals.    [provider]  ferrous gluconate  (FERGON) 325 MG tablet Take 1 tablet (325 mg total) by mouth 3 (three) times daily with meals. 08/18/17   Regalado, Belkys A, MD  GARLIC PO Take 3 capsules  by mouth daily.    [provider]  hydrALAZINE  (APRESOLINE ) 100 MG tablet Take 1 tablet (100 mg total) by mouth 3 (three) times daily. 08/21/22   Plotnikov, Aleksei V, MD  hydrochlorothiazide  (HYDRODIURIL ) 25 MG tablet TAKE 1 TABLET (25 MG TOTAL) BY MOUTH DAILY. 10/02/23   Plotnikov, Aleksei V, MD  ipratropium-albuterol  (DUONEB) 0.5-2.5 (3) MG/3ML SOLN Take 3 mLs by nebulization 2 (two) times daily. Patient taking differently: Take 3 mLs by nebulization daily as needed (wheezing). 08/18/17   Regalado, Belkys A, MD  labetalol  (NORMODYNE ) 300 MG tablet TAKE 2 TABLETS BY MOUTH TWICE A DAY 11/15/23   Webb, Padonda B, FNP  Lactobacillus (PROBIOTIC ACIDOPHILUS PO) Take 2 tablets by mouth daily.    [provider]  Melatonin 10 MG TABS Take 20 mg by mouth at bedtime as needed (sleep).     [provider]  Multiple Vitamin (MULTIVITAMIN WITH MINERALS) TABS tablet Take 1 tablet by mouth at bedtime.    [provider]  Multiple Vitamins-Iron  (CHLORELLA PO) Take 4 g by mouth daily.    [provider]  potassium chloride  SA (KLOR-CON  M) 20 MEQ tablet Take 1 tablet (20 mEq total) by mouth daily. 08/22/22   Plotnikov, Aleksei V, MD  Turmeric POWD Take  7.5 mLs by mouth at bedtime.     [provider]      Allergies    Olmesartan , Other, Shellfish allergy, Erythromycin, Moxifloxacin, Penicillins, and Tequin [gatifloxacin]    Review of Systems   Review of Systems  Cardiovascular:  Positive for chest pain.    Physical Exam Updated Vital Signs BP (!) 194/77 (BP Location: Right Wrist)   Pulse 64   Temp 98.4 F (36.9 C) (Oral)   Resp 18   SpO2 100%  Physical Exam Vitals and nursing note reviewed.  Constitutional:      General: She is not in acute distress.    Appearance: She is well-developed.  HENT:     Head: Normocephalic and atraumatic.  Eyes:     Conjunctiva/sclera: Conjunctivae normal.  Cardiovascular:     Rate and Rhythm: Normal rate and  regular rhythm.     Heart sounds: No murmur heard. Pulmonary:     Effort: Pulmonary effort is normal. No respiratory distress.     Breath sounds: Normal breath sounds.  Abdominal:     Palpations: Abdomen is soft.     Tenderness: There is no abdominal tenderness.  Musculoskeletal:        General: No swelling.     Cervical back: Neck supple.  Skin:    General: Skin is warm and dry.     Capillary Refill: Capillary refill takes less than 2 seconds.  Neurological:     Mental Status: She is alert.  Psychiatric:        Mood and Affect: Mood normal.     ED Results / Procedures / Treatments   Labs (all labs ordered are listed, but only abnormal results are displayed) Labs Reviewed  BASIC METABOLIC PANEL WITH GFR - Abnormal; Notable for the following components:      Result Value   Creatinine, Ser 1.23 (*)    GFR, Estimated 52 (*)    All other components within normal limits  CBC - Abnormal; Notable for the following components:   Hemoglobin 11.3 (*)    All other components within normal limits  HCG, SERUM, QUALITATIVE  BRAIN NATRIURETIC PEPTIDE  D-DIMER, QUANTITATIVE (NOT AT [facility])  TROPONIN I (HIGH SENSITIVITY)  TROPONIN I (HIGH SENSITIVITY)    EKG EKG Interpretation Date/Time:  Tuesday Dec 11 2023 14:13:23 EDT Ventricular Rate:  63 PR Interval:  172 QRS Duration:  88 QT Interval:  436 QTC Calculation: 446 R Axis:   29  Text Interpretation: Normal sinus rhythm Nonspecific T wave abnormality Abnormal ECG When compared with ECG of 21-Nov-2018 16:49, PREVIOUS ECG IS PRESENT when compared top rior, similar appearance No STEMI Confirmed by Wynell Heath ([id number]) on 12/11/2023 4:45:04 PM  Radiology CT Angio Chest PE W and/or Wo Contrast Result Date: 12/11/2023 CLINICAL DATA:  Chest pain. EXAM: CT ANGIOGRAPHY CHEST WITH CONTRAST TECHNIQUE: Multidetector CT imaging of the chest was performed using the standard protocol during bolus administration of intravenous contrast.  Multiplanar CT image reconstructions and MIPs were obtained to evaluate the vascular anatomy. RADIATION DOSE REDUCTION: This exam was performed according to the departmental dose-optimization program which includes automated exposure control, adjustment of the mA and/or kV according to patient size and/or use of iterative reconstruction technique. CONTRAST:  75mL OMNIPAQUE IOHEXOL 350 MG/ML SOLN COMPARISON:  Chest CT dated 08/13/2017. FINDINGS: Cardiovascular: Borderline cardiomegaly. No pericardial effusion. The thoracic aorta is unremarkable. No pulmonary artery embolus identified. Mediastinum/Nodes: No hilar or mediastinal adenopathy. Small hiatal hernia. The esophagus and the thyroid  gland are grossly  unremarkable. No mediastinal fluid collection. Lungs/Pleura: No focal consolidation, pleural effusion, or pneumothorax. The central airways are patent. Upper Abdomen: No acute abnormality. Musculoskeletal: No acute osseous pathology. Review of the MIP images confirms the above findings. IMPRESSION: 1. No acute intrathoracic pathology. No CT evidence of pulmonary artery embolus. 2. Small hiatal hernia. Electronically Signed   By: Angus Bark M.D.   On: 12/11/2023 21:26   DG Chest 2 View Result Date: 12/11/2023 CLINICAL DATA:  Chest pain. EXAM: CHEST - 2 VIEW COMPARISON:  January 12, 2020. FINDINGS: Mild cardiomegaly with mild central pulmonary vascular congestion. Both lungs are clear. The visualized skeletal structures are unremarkable. IMPRESSION: Mild cardiomegaly with mild central pulmonary vascular congestion. Electronically Signed   By: Rosalene Colon M.D.   On: 12/11/2023 15:01    Procedures Procedures    Medications Ordered in ED Medications  iohexol (OMNIPAQUE) 350 MG/ML injection 75 mL (75 mLs Intravenous Contrast Given 12/11/23 2122)    ED Course/ Medical Decision Making/ A&P                                 Medical Decision Making Amount and/or Complexity of Data Reviewed Labs:  ordered. Radiology: ordered.  Risk Prescription drug management.   This patient presents to the ED with chief complaint(s) of chest pain.  The complaint involves an extensive differential diagnosis and also carries with it a high risk of complications and morbidity.   Pertinent past medical history as listed in HPI  The differential diagnosis includes  ACS, PE, dissection, pneumonia, musculoskeletal, GERD, CHF Additional history obtained: Records reviewed Care Everywhere/External Records  Assessment and management:   Patient presents hypertensive and tachypneic with complaints of chest pain.  Pain is worse with inspirations.  Started earlier today.  There is no exertional component to her symptoms and even seems to be worse with lying down.  Has no reproducible chest wall tenderness.  Her lung sounds are clear.  She has no cough or congestion, she is afebrile.  No suspicion for pneumonia.  X-ray does demonstrate some pulmonary congestion.  Has no history of heart failure or cardiac events.  She has no history of prior blood clots however she does currently have breast cancer.  Although patient is not tachycardic, has no elevation in troponin or EKG changes and her symptoms are nonexertional, will still obtain CT angio PE giving significant risk factors for PE.  Independent ECG interpretation:  Sinus rhythm with nonspecific T wave abnormalities  Independent labs interpretation:  The following labs were independently interpreted:  CBC without significant abnormality, BMP with mildly elevated creatinine, hCG negative, troponin without elevation  Independent visualization and interpretation of imaging: I independently visualized the following imaging with scope of interpretation limited to determining acute life threatening conditions related to emergency care:  Chest x-ray with mild cardiomegaly and pulmonary congestion CT angio PE no acute abnormality   Consultations obtained:    none  Disposition:   Patient will be discharged home. The patient has been appropriately medically screened and/or stabilized in the ED. I have low suspicion for any other emergent medical condition which would require further screening, evaluation or treatment in the ED or require inpatient management. At time of discharge the patient is hemodynamically stable and in no acute distress. I have discussed work-up results and diagnosis with patient and answered all questions. Patient is agreeable with discharge plan. We discussed strict return precautions for returning to  the emergency department and they verbalized understanding.     Social Determinants of Health:   none  This note was dictated with voice recognition software.  Despite best efforts at proofreading, errors may have occurred which can change the documentation meaning.          Final Clinical Impression(s) / ED Diagnoses Final diagnoses:  Chest pain, unspecified type    Rx / DC Orders ED Discharge Orders     None         Stanton Earthly 12/11/23 2149    Tegeler, Marine Sia, MD 12/11/23 [phone]

## 2023-12-31 ENCOUNTER — Inpatient Hospital Stay: Payer: Managed Care, Other (non HMO) | Attending: Hematology and Oncology | Admitting: Hematology and Oncology

## 2023-12-31 ENCOUNTER — Inpatient Hospital Stay: Payer: Managed Care, Other (non HMO) | Attending: Hematology and Oncology

## 2023-12-31 VITALS — BP 134/65 | HR 65 | Temp 98.0°F | Resp 18 | Ht 64.0 in | Wt 300.0 lb

## 2023-12-31 DIAGNOSIS — C50411 Malignant neoplasm of upper-outer quadrant of right female breast: Secondary | ICD-10-CM | POA: Diagnosis present

## 2023-12-31 DIAGNOSIS — Z170 Estrogen receptor positive status [ER+]: Secondary | ICD-10-CM | POA: Diagnosis not present

## 2023-12-31 DIAGNOSIS — Z79811 Long term (current) use of aromatase inhibitors: Secondary | ICD-10-CM | POA: Diagnosis not present

## 2023-12-31 NOTE — Assessment & Plan Note (Addendum)
 Right breast invasive lobular cancer grade 1-2, ER 99%, PR 99%, HER-2 negative Ratio 1.3, Ki-67 13%, initial MRI revealed 7.1 cm area of mass or non-mass enhancement. Current treatment: Zoladex  (previously noncompliant), anastrozole  1 mg daily Patient never had surgery because she does not wish to undergo mastectomy.   Breast MRI 07/04/2019: Significant decrease in the previous demonstrated non-mass enhancement UOQ right breast with the exception of 9 mmNME 8:30 position, interval decrease in background enhancement involving all 4 quadrants.  Left breast normal   Current Treatment: Zoladex  with anastrozole  started June 2020 (originally started in 2014) She continues with her herbal supplements and other medication      Surveillance: 1.  Mammogram 02/12/2023: Subtle distortion at the site of previous biopsy.  Difficult to determine by mammogram and ultrasound.  Recommendation is breast MRI.  Therefore we will obtain a breast MRI  02/08/2022: The known right breast cancer at 10 o'clock position stable (2.7 cm previously was 2.4 cm: Borders are indistinct and therefore precise measurement is difficult) 2. breast MRI 07/09/20: Dec NME UOQ rt breast. Dec background enh, stable 0.8 cm LOQ Rt breast NME 3.  Breast exam: 12/31/2023: Benign     RTC in 1 year

## 2023-12-31 NOTE — Progress Notes (Signed)
 Patient Care Team: Plotnikov, Oakley Bellman, MD as PCP - General  DIAGNOSIS:  Encounter Diagnosis  Name Primary?   Malignant neoplasm of upper-outer quadrant of right breast in female, estrogen receptor positive (HCC) Yes    SUMMARY OF ONCOLOGIC HISTORY: Oncology History  Cancer of upper-outer quadrant of female breast (HCC)  08/09/2012 Initial Diagnosis   Right breast biopsy 10 o'clock position: Invasive lobular cancer, ALH, grade 1-2, ER 99%, PR 99%, Ki-67 13%, HER-2 negative ratio 1.3   09/26/2012 -  Anti-estrogen oral therapy   Zoladex  with anastrozole  prescribed at [facility] [place] along with lots of herbs.  Annual mammograms and MRIs showed stable findings.  Patient never had surgery     CHIEF COMPLIANT: Follow-up to discuss antiestrogen therapy  HISTORY OF PRESENT ILLNESS:  History of Present Illness Jasmine Stafford is a 58 year old female with breast cancer who presents for follow-up and evaluation of recent imaging results.  She is currently on a regimen that includes oral medication and injections for breast cancer, which she tolerates well. Her 2024 mammogram was difficult to interpret due to faint imaging, leading to a recommendation for an MRI for clearer visualization. Previous MRIs were performed, with the last one in 2021.  She also needs a new bone density test.     ALLERGIES:  is allergic to olmesartan , other, shellfish allergy, erythromycin, moxifloxacin, penicillins, and tequin [gatifloxacin].  MEDICATIONS:  Current Outpatient Medications  Medication Sig Dispense Refill   anastrozole  (ARIMIDEX ) 1 MG tablet TAKE 1 TABLET BY MOUTH EVERY DAY 90 tablet 3   Ascorbic Acid (VITAMIN C) 1000 MG tablet Take 3,000 mg by mouth daily.      Calcium  Carb-Cholecalciferol  600-200 MG-UNIT TABS Take 1 tablet by mouth daily.     Cholecalciferol  (VITAMIN D3) 10000 UNITS capsule Take 10,000 Units by mouth daily.     Coenzyme Q10 (COQ10) 400 MG CAPS Take  400 mg by mouth daily.     DANDELION PO Take 2 capsules by mouth daily.      Digestive Enzymes CAPS Take 2 capsules by mouth 3 (three) times daily with meals.     GARLIC PO Take 3 capsules by mouth daily.     hydrALAZINE  (APRESOLINE ) 100 MG tablet Take 1 tablet (100 mg total) by mouth 3 (three) times daily. 270 tablet 3   hydrochlorothiazide  (HYDRODIURIL ) 25 MG tablet TAKE 1 TABLET (25 MG TOTAL) BY MOUTH DAILY. 90 tablet 0   labetalol  (NORMODYNE ) 300 MG tablet TAKE 2 TABLETS BY MOUTH TWICE A DAY 360 tablet 1   Lactobacillus (PROBIOTIC ACIDOPHILUS PO) Take 2 tablets by mouth daily.     Multiple Vitamin (MULTIVITAMIN WITH MINERALS) TABS tablet Take 1 tablet by mouth at bedtime.     Multiple Vitamins-Iron  (CHLORELLA PO) Take 4 g by mouth daily.     potassium chloride  SA (KLOR-CON  M) 20 MEQ tablet Take 1 tablet (20 mEq total) by mouth daily. 90 tablet 3   Turmeric POWD Take 7.5 mLs by mouth at bedtime.      ferrous gluconate  (FERGON) 325 MG tablet Take 1 tablet (325 mg total) by mouth 3 (three) times daily with meals. (Patient not taking: Reported on 12/31/2023) 30 tablet 0   ipratropium-albuterol  (DUONEB) 0.5-2.5 (3) MG/3ML SOLN Take 3 mLs by nebulization 2 (two) times daily. (Patient not taking: Reported on 12/31/2023) 360 mL 0   Melatonin 10 MG TABS Take 20 mg by mouth at bedtime as needed (sleep).  (Patient not taking: Reported on  12/31/2023)     No current facility-administered medications for this visit.    PHYSICAL EXAMINATION: ECOG PERFORMANCE STATUS: 1 - Symptomatic but completely ambulatory  Vitals:   12/31/23 1502  BP: 134/65  Pulse: 65  Resp: 18  Temp: 98 F (36.7 C)  SpO2: 95%   Filed Weights   12/31/23 1502  Weight: 300 lb (136.1 kg)    LABORATORY DATA:  I have reviewed the data as listed    Latest Ref Rng & Units 12/11/2023    2:13 PM 08/21/2022    4:53 PM 06/02/2021   11:40 AM  CMP  Glucose 70 - 99 mg/dL 98  98  85   BUN 6 - 20 mg/dL 16  19  15    Creatinine 0.44 -  1.00 mg/dL 1.61  0.96  0.45   Sodium 135 - 145 mmol/L 140  140  141   Potassium 3.5 - 5.1 mmol/L 3.5  3.3  3.6   Chloride 98 - 111 mmol/L 104  100  105   CO2 22 - 32 mmol/L 27  31  31    Calcium  8.9 - 10.3 mg/dL 9.1  9.5  9.4   Total Protein 6.0 - 8.3 g/dL  7.8  7.6   Total Bilirubin 0.2 - 1.2 mg/dL  0.4  0.6   Alkaline Phos 39 - 117 U/L  82  104   AST 0 - 37 U/L  20  21   ALT 0 - 35 U/L  18  23     Lab Results  Component Value Date   WBC 10.3 12/11/2023   HGB 11.3 (L) 12/11/2023   HCT 36.2 12/11/2023   MCV 93.3 12/11/2023   PLT 251 12/11/2023   NEUTROABS 6.0 08/21/2022    ASSESSMENT & PLAN:  Cancer of upper-outer quadrant of female breast (HCC) Right breast invasive lobular cancer grade 1-2, ER 99%, PR 99%, HER-2 negative Ratio 1.3, Ki-67 13%, initial MRI revealed 7.1 cm area of mass or non-mass enhancement. Current treatment: Zoladex  (previously noncompliant), anastrozole  1 mg daily Patient never had surgery because she does not wish to undergo mastectomy.   Breast MRI 07/04/2019: Significant decrease in the previous demonstrated non-mass enhancement UOQ right breast with the exception of 9 mmNME 8:30 position, interval decrease in background enhancement involving all 4 quadrants.  Left breast normal   Current Treatment: Zoladex  with anastrozole  started June 2020 (originally started in 2014) She continues with her herbal supplements and other medication      Surveillance: 1.  Mammogram 02/12/2023: Subtle distortion at the site of previous biopsy.  Difficult to determine by mammogram and ultrasound.  Recommendation is breast MRI.  Therefore we will obtain a breast MRI  02/08/2022: The known right breast cancer at 10 o'clock position stable (2.7 cm previously was 2.4 cm: Borders are indistinct and therefore precise measurement is difficult) 2. breast MRI 07/09/20: Dec NME UOQ rt breast. Dec background enh, stable 0.8 cm LOQ Rt breast NME 3.   Recommendation: Obtain an mammogram  and ultrasound.  If there is still recommend breast MRI then I will order the MRI at that time.   Telephone visit after the mammogram and ultrasound to discuss ordering MRI. We also want to see if she is in menopause.  Therefore we will cancel her Zoladex  injections and recheck her labs in 3 months and follow-up with a telephone visit after that to discuss results.  If she is still premenopausal and we will continue with her Zoladex  shots.  RTC in 1 year ------------------------------------- Assessment and Plan Assessment & Plan Malignant neoplasm of upper-outer quadrant of right breast Mammogram inconclusive; MRI needed for better visualization. Current treatment well-tolerated. Menopausal status assessment may lead to Zoladex  discontinuation. - Order mammogram and ultrasound in July. - Review results to determine need for MRI. - Hold Zoladex  for three months to assess menopausal status. - Order blood work after three months to evaluate menopausal status. - Schedule follow-up call one week after blood work to discuss results and decide on Zoladex  continuation.         Orders Placed This Encounter  Procedures   MM DIAG BREAST TOMO BILATERAL    Standing Status:   Future    Expected Date:   02/13/2024    Expiration Date:   12/30/2024    Reason for Exam (SYMPTOM  OR DIAGNOSIS REQUIRED):   Breast cancer surveillance    Is the patient pregnant?:   No    [facility]    Release to patient:   Immediate   US  LIMITED ULTRASOUND INCLUDING AXILLA RIGHT BREAST    Standing Status:   Future    Expected Date:   02/13/2024    Expiration Date:   12/30/2024    Reason for Exam (SYMPTOM  OR DIAGNOSIS REQUIRED):   breast cancer follow up    Preferred Imaging Location?:   [facility]    Release to patient:   Immediate   FSH-Follicle stimulating hormone    Standing Status:   Future    Expiration Date:   12/30/2024   Estradiol, Sensitive    Standing Status:    Future    Expiration Date:   12/30/2024   The patient has a good understanding of the overall plan. she agrees with it. she will call with any problems that may develop before the next visit here. Total time spent: 30 mins including face to face time and time spent for planning, charting and co-ordination of care   Margert Sheerer, MD 12/31/23

## 2024-01-08 ENCOUNTER — Other Ambulatory Visit: Payer: Self-pay | Admitting: Internal Medicine

## 2024-01-22 ENCOUNTER — Inpatient Hospital Stay: Admitting: Hematology and Oncology

## 2024-01-23 ENCOUNTER — Inpatient Hospital Stay

## 2024-01-23 DIAGNOSIS — C50411 Malignant neoplasm of upper-outer quadrant of right female breast: Secondary | ICD-10-CM

## 2024-01-24 LAB — FOLLICLE STIMULATING HORMONE: FSH: 5.2 m[IU]/mL

## 2024-01-31 LAB — ESTRADIOL, ULTRA SENS: Estradiol, Sensitive: <2.5 pg/mL

## 2024-02-05 NOTE — Assessment & Plan Note (Signed)
 Right breast invasive lobular cancer grade 1-2, ER 99%, PR 99%, HER-2 negative Ratio 1.3, Ki-67 13%, initial MRI revealed 7.1 cm area of mass or non-mass enhancement. Current treatment: Zoladex  (previously noncompliant), anastrozole  1 mg daily Patient never had surgery because she does not wish to undergo mastectomy.   Breast MRI 07/04/2019: Significant decrease in the previous demonstrated non-mass enhancement UOQ right breast with the exception of 9 mmNME 8:30 position, interval decrease in background enhancement involving all 4 quadrants.  Left breast normal   Current Treatment: Zoladex  with anastrozole  started June 2020 (originally started in 2014) She continues with her herbal supplements and other medication      Surveillance: 1.  Mammogram 02/12/2023: Subtle distortion at the site of previous biopsy.  Difficult to determine by mammogram and ultrasound.  Recommendation is breast MRI.  Therefore we will obtain a breast MRI   02/08/2022: The known right breast cancer at 10 o'clock position stable (2.7 cm previously was 2.4 cm: Borders are indistinct and therefore precise measurement is difficult) 2. breast MRI 07/09/20: Dec NME UOQ rt breast. Dec background enh, stable 0.8 cm LOQ Rt breast NME 3.   Recommendation: Obtain an mammogram and ultrasound.  Scheduled for 02/14/2024

## 2024-02-06 ENCOUNTER — Inpatient Hospital Stay: Attending: Hematology and Oncology | Admitting: Hematology and Oncology

## 2024-02-06 ENCOUNTER — Inpatient Hospital Stay: Admitting: Hematology and Oncology

## 2024-02-06 DIAGNOSIS — Z170 Estrogen receptor positive status [ER+]: Secondary | ICD-10-CM | POA: Diagnosis not present

## 2024-02-06 DIAGNOSIS — C50411 Malignant neoplasm of upper-outer quadrant of right female breast: Secondary | ICD-10-CM

## 2024-02-06 MED ORDER — ANASTROZOLE 1 MG PO TABS: 1.0000 mg | ORAL_TABLET | Freq: Every day | ORAL | 3 refills | Status: AC

## 2024-02-06 NOTE — Progress Notes (Signed)
 Patient Care Team: Plotnikov, Karlynn GAILS, MD as PCP - General  DIAGNOSIS:  Encounter Diagnosis  Name Primary?   Malignant neoplasm of upper-outer quadrant of right breast in female, estrogen receptor positive (HCC) Yes    SUMMARY OF ONCOLOGIC HISTORY: Oncology History  Cancer of upper-outer quadrant of female breast (HCC)  08/09/2012 Initial Diagnosis   Right breast biopsy 10 o'clock position: Invasive lobular cancer, ALH, grade 1-2, ER 99%, PR 99%, Ki-67 13%, HER-2 negative ratio 1.3   09/26/2012 -  Anti-estrogen oral therapy   Zoladex  with anastrozole  prescribed at cancer treatment centers of [place] along with lots of herbs.  Annual mammograms and MRIs showed stable findings.  Patient never had surgery     CHIEF COMPLIANT:   HISTORY OF PRESENT ILLNESS:  History of Present Illness      ALLERGIES:  is allergic to olmesartan , other, shellfish allergy, erythromycin, moxifloxacin, penicillins, and tequin [gatifloxacin].  MEDICATIONS:  Current Outpatient Medications  Medication Sig Dispense Refill   anastrozole  (ARIMIDEX ) 1 MG tablet TAKE 1 TABLET BY MOUTH EVERY DAY 90 tablet 3   Ascorbic Acid (VITAMIN C) 1000 MG tablet Take 3,000 mg by mouth daily.      Calcium  Carb-Cholecalciferol  600-200 MG-UNIT TABS Take 1 tablet by mouth daily.     Cholecalciferol  (VITAMIN D3) 10000 UNITS capsule Take 10,000 Units by mouth daily.     Coenzyme Q10 (COQ10) 400 MG CAPS Take 400 mg by mouth daily.     DANDELION PO Take 2 capsules by mouth daily.      Digestive Enzymes CAPS Take 2 capsules by mouth 3 (three) times daily with meals.     ferrous gluconate  (FERGON) 325 MG tablet Take 1 tablet (325 mg total) by mouth 3 (three) times daily with meals. (Patient not taking: Reported on 12/31/2023) 30 tablet 0   GARLIC PO Take 3 capsules by mouth daily.     hydrALAZINE  (APRESOLINE ) 100 MG tablet Take 1 tablet (100 mg total) by mouth 3 (three) times daily. 270 tablet 3   hydrochlorothiazide   (HYDRODIURIL ) 25 MG tablet TAKE 1 TABLET (25 MG TOTAL) BY MOUTH DAILY. 90 tablet 0   ipratropium-albuterol  (DUONEB) 0.5-2.5 (3) MG/3ML SOLN Take 3 mLs by nebulization 2 (two) times daily. (Patient not taking: Reported on 12/31/2023) 360 mL 0   labetalol  (NORMODYNE ) 300 MG tablet TAKE 2 TABLETS BY MOUTH TWICE A DAY 360 tablet 1   Lactobacillus (PROBIOTIC ACIDOPHILUS PO) Take 2 tablets by mouth daily.     Melatonin 10 MG TABS Take 20 mg by mouth at bedtime as needed (sleep).  (Patient not taking: Reported on 12/31/2023)     Multiple Vitamin (MULTIVITAMIN WITH MINERALS) TABS tablet Take 1 tablet by mouth at bedtime.     Multiple Vitamins-Iron  (CHLORELLA PO) Take 4 g by mouth daily.     potassium chloride  SA (KLOR-CON  M) 20 MEQ tablet Take 1 tablet (20 mEq total) by mouth daily. 90 tablet 3   Turmeric POWD Take 7.5 mLs by mouth at bedtime.      No current facility-administered medications for this visit.    PHYSICAL EXAMINATION: ECOG PERFORMANCE STATUS: 1 - Symptomatic but completely ambulatory  There were no vitals filed for this visit. There were no vitals filed for this visit.  Physical Exam   (exam performed in the presence of a chaperone)  LABORATORY DATA:  I have reviewed the data as listed    Latest Ref Rng & Units 12/11/2023    2:13 PM 08/21/2022  4:53 PM 06/02/2021   11:40 AM  CMP  Glucose 70 - 99 mg/dL 98  98  85   BUN 6 - 20 mg/dL 16  19  15    Creatinine 0.44 - 1.00 mg/dL 8.76  8.86  8.90   Sodium 135 - 145 mmol/L 140  140  141   Potassium 3.5 - 5.1 mmol/L 3.5  3.3  3.6   Chloride 98 - 111 mmol/L 104  100  105   CO2 22 - 32 mmol/L 27  31  31    Calcium  8.9 - 10.3 mg/dL 9.1  9.5  9.4   Total Protein 6.0 - 8.3 g/dL  7.8  7.6   Total Bilirubin 0.2 - 1.2 mg/dL  0.4  0.6   Alkaline Phos 39 - 117 U/L  82  104   AST 0 - 37 U/L  20  21   ALT 0 - 35 U/L  18  23     Lab Results  Component Value Date   WBC 10.3 12/11/2023   HGB 11.3 (L) 12/11/2023   HCT 36.2 12/11/2023   MCV  93.3 12/11/2023   PLT 251 12/11/2023   NEUTROABS 6.0 08/21/2022    ASSESSMENT & PLAN:  Cancer of upper-outer quadrant of female breast (HCC) Right breast invasive lobular cancer grade 1-2, ER 99%, PR 99%, HER-2 negative Ratio 1.3, Ki-67 13%, initial MRI revealed 7.1 cm area of mass or non-mass enhancement. Current treatment: Zoladex  (previously noncompliant), anastrozole  1 mg daily Patient never had surgery because she does not wish to undergo mastectomy.   Breast MRI 07/04/2019: Significant decrease in the previous demonstrated non-mass enhancement UOQ right breast with the exception of 9 mmNME 8:30 position, interval decrease in background enhancement involving all 4 quadrants.  Left breast normal   Current Treatment: Zoladex  with anastrozole  started June 2020 (originally started in 2014) She continues with her herbal supplements and other medication  FSH: 5.2, Estradiol  < 2.5 I discussed with the patient that the levels could still be under the influence of Zoladex  injection that she received on Nov 29, 2023.  We would like to hold off on Zoladex  for couple more months and recheck her labs again.     Surveillance: 1.  Mammogram 02/12/2023: Subtle distortion at the site of previous biopsy.  Difficult to determine by mammogram and ultrasound.  Recommendation is breast MRI.  Therefore we will obtain a breast MRI   02/08/2022: The known right breast cancer at 10 o'clock position stable (2.7 cm previously was 2.4 cm: Borders are indistinct and therefore precise measurement is difficult) 2. breast MRI 07/09/20: Dec NME UOQ rt breast. Dec background enh, stable 0.8 cm LOQ Rt breast NME 3.   Recommendation: Obtain an mammogram and ultrasound.  Scheduled for 02/14/2024  Follow-up with labs in 2 months for [facility] and estradiol   No orders of the defined types were placed in this encounter.  The patient has a good understanding of the overall plan. she agrees with it. she will call with any problems  that may develop before the next visit here. Total time spent: 30 mins including face to face time and time spent for planning, charting and co-ordination of care   Naomi MARLA Chad, MD 02/06/24

## 2024-02-14 ENCOUNTER — Ambulatory Visit
Admission: RE | Admit: 2024-02-14 | Discharge: 2024-02-14 | Disposition: A | Discharge status: Home / Self Care | Source: Ambulatory Visit | Attending: Hematology and Oncology | Admitting: Hematology and Oncology

## 2024-02-14 ENCOUNTER — Ambulatory Visit

## 2024-02-14 DIAGNOSIS — Z170 Estrogen receptor positive status [ER+]: Secondary | ICD-10-CM

## 2024-04-02 ENCOUNTER — Inpatient Hospital Stay

## 2024-04-02 ENCOUNTER — Telehealth: Payer: Self-pay | Admitting: *Deleted

## 2024-04-02 NOTE — Telephone Encounter (Signed)
 Received VM from pt stating she is experiencing care trouble and would need to reschedule lab appt.  Message sent to scheduling team to reschedule lab and push MD appt out to be 1 week post lab.

## 2024-04-09 ENCOUNTER — Inpatient Hospital Stay: Admitting: Hematology and Oncology

## 2024-04-10 ENCOUNTER — Encounter: Payer: Self-pay | Admitting: Hematology and Oncology

## 2024-04-10 ENCOUNTER — Inpatient Hospital Stay: Attending: Hematology and Oncology

## 2024-04-10 DIAGNOSIS — C50412 Malignant neoplasm of upper-outer quadrant of left female breast: Secondary | ICD-10-CM | POA: Insufficient documentation

## 2024-04-10 DIAGNOSIS — C50411 Malignant neoplasm of upper-outer quadrant of right female breast: Secondary | ICD-10-CM | POA: Insufficient documentation

## 2024-04-10 DIAGNOSIS — Z170 Estrogen receptor positive status [ER+]: Secondary | ICD-10-CM

## 2024-04-11 LAB — FOLLICLE STIMULATING HORMONE: FSH: 7 m[IU]/mL

## 2024-04-13 LAB — ESTRADIOL, ULTRA SENS: Estradiol, Sensitive: <2.5 pg/mL

## 2024-04-21 ENCOUNTER — Inpatient Hospital Stay: Admitting: Hematology and Oncology

## 2024-04-21 DIAGNOSIS — C50411 Malignant neoplasm of upper-outer quadrant of right female breast: Secondary | ICD-10-CM

## 2024-04-21 DIAGNOSIS — Z170 Estrogen receptor positive status [ER+]: Secondary | ICD-10-CM | POA: Diagnosis not present

## 2024-04-21 NOTE — Assessment & Plan Note (Signed)
 Right breast invasive lobular cancer grade 1-2, ER 99%, PR 99%, HER-2 negative Ratio 1.3, Ki-67 13%, initial MRI revealed 7.1 cm area of mass or non-mass enhancement. Current treatment: Zoladex  (previously noncompliant), anastrozole  1 mg daily Patient never had surgery because she does not wish to undergo mastectomy.   Breast MRI 07/04/2019: Significant decrease in the previous demonstrated non-mass enhancement UOQ right breast with the exception of 9 mmNME 8:30 position, interval decrease in background enhancement involving all 4 quadrants.  Left breast normal   Current Treatment: Zoladex  with anastrozole  started June 2020 (originally started in 2014) She continues with her herbal supplements and other medication  FSH: 5.2, Estradiol  < 2.5 I discussed with the patient that the levels could still be under the influence of Zoladex  injection that she received on Nov 29, 2023.  We held Zoladex  and repeated labs  04/10/2024: FSH 7, estradiol  less than 2.5.  FSH is indicating that she is not yet in menopause.  However the estradiol  level is completely suppressed and therefore I recommended watchful monitoring and rechecking labs again in 3 months and holding the Zoladex  injection   Surveillance: 1.  Mammogram 02/12/2023: Subtle distortion at the site of previous biopsy.  Difficult to determine by mammogram and ultrasound.  Recommendation is breast MRI.  Therefore we will obtain a breast MRI   02/08/2022: The known right breast cancer at 10 o'clock position stable (2.7 cm previously was 2.4 cm: Borders are indistinct and therefore precise measurement is difficult) 2. breast MRI 07/09/20: Dec NME UOQ rt breast. Dec background enh, stable 0.8 cm LOQ Rt breast NME    Follow-up with labs in 3 months for [facility] and estradiol 

## 2024-04-21 NOTE — Progress Notes (Signed)
 HEMATOLOGY-ONCOLOGY TELEPHONE VISIT PROGRESS NOTE  I connected with our patient on 04/21/24 at  9:30 AM EDT by telephone and verified that I am speaking with the correct person using two identifiers.  I discussed the limitations, risks, security and privacy concerns of performing an evaluation and management service by telephone and the availability of in person appointments.  I also discussed with the patient that there may be a patient responsible charge related to this service. The patient expressed understanding and agreed to proceed.   History of Present Illness:    History of Present Illness Jasmine Stafford is a 58 year old female with lobular cancer who presents for follow-up on her menopausal status and MRI scheduling.  Blood work from September 11th indicates no estrogen production. Zoladex  injections were last administered on May 1st, and she has not continued them recently. She observes subtle changes, possibly due to her body readjusting. A mammogram in July revealed a B category density, indicating it is not excessively dense. She has no major concerns at this time.    Oncology History  Cancer of upper-outer quadrant of female breast (HCC)  08/09/2012 Initial Diagnosis   Right breast biopsy 10 o'clock position: Invasive lobular cancer, ALH, grade 1-2, ER 99%, PR 99%, Ki-67 13%, HER-2 negative ratio 1.3   09/26/2012 -  Anti-estrogen oral therapy   Zoladex  with anastrozole  prescribed at cancer treatment centers of [place] along with lots of herbs.  Annual mammograms and MRIs showed stable findings.  Patient never had surgery     REVIEW OF SYSTEMS:   Constitutional: Denies fevers, chills or abnormal weight loss All other systems were reviewed with the patient and are negative. Observations/Objective:     Assessment Plan:  Cancer of upper-outer quadrant of female breast (HCC) Right breast invasive lobular cancer grade 1-2, ER 99%, PR 99%, HER-2 negative Ratio 1.3, Ki-67  13%, initial MRI revealed 7.1 cm area of mass or non-mass enhancement. Current treatment: Zoladex  (previously noncompliant), anastrozole  1 mg daily Patient never had surgery because she does not wish to undergo mastectomy.   Breast MRI 07/04/2019: Significant decrease in the previous demonstrated non-mass enhancement UOQ right breast with the exception of 9 mmNME 8:30 position, interval decrease in background enhancement involving all 4 quadrants.  Left breast normal   Current Treatment: Zoladex  with anastrozole  started June 2020 (originally started in 2014) She continues with her herbal supplements and other medication  FSH: 5.2, Estradiol  < 2.5 I discussed with the patient that the levels could still be under the influence of Zoladex  injection that she received on Nov 29, 2023.  We held Zoladex  and repeated labs  04/10/2024: FSH 7, estradiol  less than 2.5.  FSH is indicating that she is not yet in menopause.  However the estradiol  level is completely suppressed and therefore I recommended watchful monitoring and rechecking labs again in 3 months and holding the Zoladex  injection   Surveillance: 1.  Mammogram 02/12/2023: Subtle distortion at the site of previous biopsy.  Difficult to determine by mammogram and ultrasound.  Recommendation is breast MRI.  Therefore we will obtain a breast MRI   02/08/2022: The known right breast cancer at 10 o'clock position stable (2.7 cm previously was 2.4 cm: Borders are indistinct and therefore precise measurement is difficult) 2. breast MRI 07/09/20: Dec NME UOQ rt breast. Dec background enh, stable 0.8 cm LOQ Rt breast NME    Follow-up with labs in 3 months for [facility] and estradiol  We will order the breast MRI in January  2026.   I discussed the assessment and treatment plan with the patient. The patient was provided an opportunity to ask questions and all were answered. The patient agreed with the plan and demonstrated an understanding of the instructions. The  patient was advised to call back or seek an in-person evaluation if the symptoms worsen or if the condition fails to improve as anticipated.   I provided 20 minutes of non-face-to-face time during this encounter.  This includes time for charting and coordination of care   Naomi MARLA Chad, MD

## 2024-07-21 ENCOUNTER — Other Ambulatory Visit: Payer: Self-pay

## 2024-07-21 DIAGNOSIS — Z170 Estrogen receptor positive status [ER+]: Secondary | ICD-10-CM

## 2024-07-22 ENCOUNTER — Inpatient Hospital Stay: Attending: Hematology and Oncology

## 2024-07-22 DIAGNOSIS — C50411 Malignant neoplasm of upper-outer quadrant of right female breast: Secondary | ICD-10-CM | POA: Insufficient documentation

## 2024-07-22 DIAGNOSIS — Z170 Estrogen receptor positive status [ER+]: Secondary | ICD-10-CM

## 2024-07-22 LAB — CBC WITH DIFFERENTIAL (CANCER CENTER ONLY)
Abs Immature Granulocytes: 0.02 K/uL (ref 0.00–0.07)
Basophils Absolute: 0 K/uL (ref 0.0–0.1)
Basophils Relative: 1 %
Eosinophils Absolute: 0.1 K/uL (ref 0.0–0.5)
Eosinophils Relative: 1 %
HCT: 34.6 % — ABNORMAL LOW (ref 36.0–46.0)
Hemoglobin: 11.4 g/dL — ABNORMAL LOW (ref 12.0–15.0)
Immature Granulocytes: 0 %
Lymphocytes Relative: 27 %
Lymphs Abs: 2.3 K/uL (ref 0.7–4.0)
MCH: 29.2 pg (ref 26.0–34.0)
MCHC: 32.9 g/dL (ref 30.0–36.0)
MCV: 88.7 fL (ref 80.0–100.0)
Monocytes Absolute: 0.5 K/uL (ref 0.1–1.0)
Monocytes Relative: 6 %
Neutro Abs: 5.6 K/uL (ref 1.7–7.7)
Neutrophils Relative %: 65 %
Platelet Count: 239 K/uL (ref 150–400)
RBC: 3.9 MIL/uL (ref 3.87–5.11)
RDW: 13.6 % (ref 11.5–15.5)
WBC Count: 8.5 K/uL (ref 4.0–10.5)
nRBC: 0 % (ref 0.0–0.2)

## 2024-07-22 LAB — CMP (CANCER CENTER ONLY)
ALT: 21 U/L (ref 0–44)
AST: 28 U/L (ref 15–41)
Albumin: 4 g/dL (ref 3.5–5.0)
Alkaline Phosphatase: 96 U/L (ref 38–126)
Anion gap: 10 (ref 5–15)
BUN: 19 mg/dL (ref 6–20)
CO2: 26 mmol/L (ref 22–32)
Calcium: 9 mg/dL (ref 8.9–10.3)
Chloride: 106 mmol/L (ref 98–111)
Creatinine: 1.4 mg/dL — ABNORMAL HIGH (ref 0.44–1.00)
GFR, Estimated: 44 mL/min — ABNORMAL LOW
Glucose, Bld: 157 mg/dL — ABNORMAL HIGH (ref 70–99)
Potassium: 4.1 mmol/L (ref 3.5–5.1)
Sodium: 142 mmol/L (ref 135–145)
Total Bilirubin: 0.4 mg/dL (ref 0.0–1.2)
Total Protein: 7.4 g/dL (ref 6.5–8.1)

## 2024-07-23 LAB — FOLLICLE STIMULATING HORMONE: FSH: 9.5 m[IU]/mL

## 2024-07-27 LAB — ESTRADIOL, ULTRA SENS: Estradiol, Sensitive: <2.5 pg/mL

## 2024-08-03 NOTE — Assessment & Plan Note (Signed)
 Right breast invasive lobular cancer grade 1-2, ER 99%, PR 99%, HER-2 negative Ratio 1.3, Ki-67 13%, initial MRI revealed 7.1 cm area of mass or non-mass enhancement. Current treatment: Zoladex  (previously noncompliant), anastrozole  1 mg daily Patient never had surgery because she does not wish to undergo mastectomy.   Breast MRI 07/04/2019: Significant decrease in the previous demonstrated non-mass enhancement UOQ right breast with the exception of 9 mmNME 8:30 position, interval decrease in background enhancement involving all 4 quadrants.  Left breast normal   Current Treatment: Zoladex  with anastrozole  started June 2020 (originally started in 2014) She continues with her herbal supplements and other medication  FSH: 5.2, Estradiol  < 2.5 I discussed with the patient that the levels could still be under the influence of Zoladex  injection that she received on Nov 29, 2023.  We held Zoladex  and repeated labs   04/10/2024: FSH 7, estradiol  less than 2.5.   07/22/24: FSH 9.5, Estradiol : < 2.5  FSH is indicating that she is not yet in menopause.  But this could be the effect of prior GnRH agonist therapy. As long as the estradiol  is suppressed, we are happy with continuing without additional GnRH therapy   Surveillance: 1.  Mammogram 02/12/2023: Subtle distortion at the site of previous biopsy.  Difficult to determine by mammogram and ultrasound.  Recommendation is breast MRI.  Therefore we will obtain a breast MRI   02/08/2022: The known right breast cancer at 10 o'clock position stable (2.7 cm previously was 2.4 cm: Borders are indistinct and therefore precise measurement is difficult) 2. breast MRI 07/09/20: Dec NME UOQ rt breast. Dec background enh, stable 0.8 cm LOQ Rt breast NME      We will order the breast MRI in January 2026.

## 2024-08-04 ENCOUNTER — Inpatient Hospital Stay: Attending: Hematology and Oncology | Admitting: Hematology and Oncology

## 2024-08-04 DIAGNOSIS — C50411 Malignant neoplasm of upper-outer quadrant of right female breast: Secondary | ICD-10-CM | POA: Insufficient documentation

## 2024-08-04 DIAGNOSIS — Z170 Estrogen receptor positive status [ER+]: Secondary | ICD-10-CM | POA: Diagnosis not present

## 2024-08-04 DIAGNOSIS — Z5111 Encounter for antineoplastic chemotherapy: Secondary | ICD-10-CM | POA: Insufficient documentation

## 2024-08-04 NOTE — Progress Notes (Signed)
 HEMATOLOGY-ONCOLOGY TELEPHONE VISIT PROGRESS NOTE  I connected with our patient on 08/04/2024 at  8:45 AM EST by telephone and verified that I am speaking with the correct person using two identifiers.  I discussed the limitations, risks, security and privacy concerns of performing an evaluation and management service by telephone and the availability of in person appointments.  I also discussed with the patient that there may be a patient responsible charge related to this service. The patient expressed understanding and agreed to proceed.   History of Present Illness: Follow-up to discuss results of FSH and estradiol   History of Present Illness Jasmine Stafford is a 59 year old female with estrogen receptor positive invasive lobular carcinoma of the right breast in remission who presents for oncology follow-up and review of recent laboratory results.  She continues adjuvant anastrozole . Zoladex  remains held due to persistently suppressed estradiol . Recent labs show estradiol  <2.5 and FSH 9.5.  She has mild improving upper respiratory symptoms with congestion and a residual productive cough that began after returning to work post-holidays. She denies fever, myalgias, or generalized aches. Symptoms improve with over-the-counter and herbal remedies.  She is interested in her estradiol  and FSH results. She also noted an elevated glucose level, which she attributes to eating shortly before the blood draw.      Oncology History  Cancer of upper-outer quadrant of female breast (HCC)  08/09/2012 Initial Diagnosis   Right breast biopsy 10 o'clock position: Invasive lobular cancer, ALH, grade 1-2, ER 99%, PR 99%, Ki-67 13%, HER-2 negative ratio 1.3   09/26/2012 -  Anti-estrogen oral therapy   Zoladex  with anastrozole  prescribed at [facility] [place] along with lots of herbs.  Annual mammograms and MRIs showed stable findings.  Patient never had surgery     REVIEW OF SYSTEMS:    Constitutional: Denies fevers, chills or abnormal weight loss All other systems were reviewed with the patient and are negative. Observations/Objective:     Assessment Plan:  Cancer of upper-outer quadrant of female breast (HCC) Right breast invasive lobular cancer grade 1-2, ER 99%, PR 99%, HER-2 negative Ratio 1.3, Ki-67 13%, initial MRI revealed 7.1 cm area of mass or non-mass enhancement. Current treatment: Zoladex  (previously noncompliant), anastrozole  1 mg daily Patient never had surgery because she does not wish to undergo mastectomy.   Breast MRI 07/04/2019: Significant decrease in the previous demonstrated non-mass enhancement UOQ right breast with the exception of 9 mmNME 8:30 position, interval decrease in background enhancement involving all 4 quadrants.  Left breast normal   Current Treatment: Zoladex  with anastrozole  started June 2020 (originally started in 2014) She continues with her herbal supplements and other medication  FSH: 5.2, Estradiol  < 2.5 I discussed with the patient that the levels could still be under the influence of Zoladex  injection that she received on Nov 29, 2023.  We held Zoladex  and repeated labs   04/10/2024: FSH 7, estradiol  less than 2.5.   07/22/24: FSH 9.5, Estradiol : < 2.5  FSH is indicating that she is not yet in menopause.  But this could be the effect of prior GnRH agonist therapy. As long as the estradiol  is suppressed, we are happy with continuing without additional GnRH therapy   Surveillance: 1.  Mammogram 02/12/2023: Subtle distortion at the site of previous biopsy.  Difficult to determine by mammogram and ultrasound.  Recommendation is breast MRI.  Therefore we will obtain a breast MRI   02/08/2022: The known right breast cancer at 10 o'clock position stable (2.7 cm  previously was 2.4 cm: Borders are indistinct and therefore precise measurement is difficult) 2. breast MRI 07/09/20: Dec NME UOQ rt breast. Dec background enh, stable 0.8 cm LOQ  Rt breast NME   MRI scheduled for January 2026 --------------------------------- Assessment and Plan Assessment & Plan Estrogen receptor positive malignant neoplasm of upper-outer quadrant of right breast - Continued anastrozole  1 mg orally daily. - Held goserelin; reassess based on future labs and menopausal status. - Ordered estradiol  and FSH recheck in three months. - Scheduled breast MRI for January 15; will discuss results post-imaging.      I discussed the assessment and treatment plan with the patient. The patient was provided an opportunity to ask questions and all were answered. The patient agreed with the plan and demonstrated an understanding of the instructions. The patient was advised to call back or seek an in-person evaluation if the symptoms worsen or if the condition fails to improve as anticipated.   I provided 20 minutes of non-face-to-face time during this encounter.  This includes time for charting and coordination of care   Naomi MARLA Chad, MD

## 2024-08-14 ENCOUNTER — Encounter: Payer: Self-pay | Admitting: Hematology and Oncology

## 2024-08-14 ENCOUNTER — Inpatient Hospital Stay: Admission: RE | Admit: 2024-08-14 | Discharge: 2024-08-14 | Attending: Hematology and Oncology

## 2024-08-14 DIAGNOSIS — C50411 Malignant neoplasm of upper-outer quadrant of right female breast: Secondary | ICD-10-CM

## 2024-08-14 MED ORDER — GADOPICLENOL 0.5 MMOL/ML IV SOLN
10.0000 mL | Freq: Once | INTRAVENOUS | Status: AC | PRN
  Administered 2024-08-14: 10 mL via INTRAVENOUS

## 2024-08-15 ENCOUNTER — Telehealth: Payer: Self-pay | Admitting: *Deleted

## 2024-08-15 NOTE — Telephone Encounter (Signed)
 RN second attempt to contact pt regarding scheduling MD f/u to review breast MRI.  No answer.  Unable to LVM due to VM being full.

## 2024-08-20 ENCOUNTER — Inpatient Hospital Stay

## 2024-08-20 ENCOUNTER — Inpatient Hospital Stay (HOSPITAL_BASED_OUTPATIENT_CLINIC_OR_DEPARTMENT_OTHER): Admitting: Hematology and Oncology

## 2024-08-20 VITALS — BP 190/75 | HR 78 | Temp 97.6°F | Resp 18 | Wt 297.2 lb

## 2024-08-20 DIAGNOSIS — C50411 Malignant neoplasm of upper-outer quadrant of right female breast: Secondary | ICD-10-CM | POA: Diagnosis not present

## 2024-08-20 DIAGNOSIS — Z170 Estrogen receptor positive status [ER+]: Secondary | ICD-10-CM

## 2024-08-20 DIAGNOSIS — D509 Iron deficiency anemia, unspecified: Secondary | ICD-10-CM

## 2024-08-20 DIAGNOSIS — Z5111 Encounter for antineoplastic chemotherapy: Secondary | ICD-10-CM | POA: Diagnosis present

## 2024-08-20 MED ORDER — GOSERELIN ACETATE 3.6 MG ~~LOC~~ IMPL
3.6000 mg | DRUG_IMPLANT | SUBCUTANEOUS | Status: DC
  Administered 2024-08-20: 3.6 mg via SUBCUTANEOUS
  Filled 2024-08-20: qty 3.6

## 2024-08-20 NOTE — Assessment & Plan Note (Signed)
 Right breast invasive lobular cancer grade 1-2, ER 99%, PR 99%, HER-2 negative Ratio 1.3, Ki-67 13%, initial MRI revealed 7.1 cm area of mass or non-mass enhancement. Current treatment: Zoladex  (previously noncompliant), anastrozole  1 mg daily Patient never had surgery because she does not wish to undergo mastectomy.   Breast MRI 07/04/2019: Significant decrease in the previous demonstrated non-mass enhancement UOQ right breast with the exception of 9 mmNME 8:30 position, interval decrease in background enhancement involving all 4 quadrants.  Left breast normal   Current Treatment: Zoladex  with anastrozole  started June 2020 (originally started in 2014) She continues with her herbal supplements and other medication  FSH: 5.2, Estradiol  < 2.5 I discussed with the patient that the levels could still be under the influence of Zoladex  injection that she received on Nov 29, 2023.  We held Zoladex  and repeated labs   04/10/2024: FSH 7, estradiol  less than 2.5.   07/22/24: FSH 9.5, Estradiol : < 2.5   FSH is indicating that she is not yet in menopause.  But this could be the effect of prior GnRH agonist therapy. As long as the estradiol  is suppressed, we are happy with continuing without additional GnRH therapy   Surveillance: 1.  Mammogram 02/12/2023: Subtle distortion at the site of previous biopsy.  Difficult to determine by mammogram and ultrasound.  Recommendation is breast MRI.  2. Breast MRI 08/14/2024: Progression. NME 4.7 cm (throuout Right breast), nipple retraction new, several Rt Axillary LN with cortical thickness

## 2024-08-20 NOTE — Progress Notes (Addendum)
 Ok to restart Zoladex  today per Dr. Gudena and ok to proceed with auth pending per PA team.  Beva Remund, PharmD, MBA

## 2024-08-20 NOTE — Progress Notes (Signed)
 "  Patient Care Team: Plotnikov, Karlynn GAILS, MD as PCP - General  DIAGNOSIS:  Encounter Diagnosis  Name Primary?   Malignant neoplasm of upper-outer quadrant of right breast in female, estrogen receptor positive (HCC) Yes    SUMMARY OF ONCOLOGIC HISTORY: Oncology History  Cancer of upper-outer quadrant of female breast (HCC)  08/09/2012 Initial Diagnosis   Right breast biopsy 10 o'clock position: Invasive lobular cancer, ALH, grade 1-2, ER 99%, PR 99%, Ki-67 13%, HER-2 negative ratio 1.3   09/26/2012 -  Anti-estrogen oral therapy   Zoladex  with anastrozole  prescribed [facility] along with lots of herbs.  Annual mammograms and MRIs showed stable findings.  Patient never had surgery     CHIEF COMPLIANT: Follow-up to discuss the result of breast MRI  HISTORY OF PRESENT ILLNESS:  History of Present Illness Jasmine Stafford is a 59 year old female with estrogen receptor positive invasive lobular carcinoma of the right breast who presents for oncology follow-up due to interval disease progression on imaging.  She remains on anastrozole  and has stopped goserelin injections. She has declined mastectomy and would consider lumpectomy if surgery is needed. Recent breast MRI shows interval progression with abnormal non-mass enhancement throughout most of the right breast, dominant areas lateral and inferior to the nipple, new imaging-documented nipple retraction with overall reduction in right breast size, and increased thickness of several right axillary lymph nodes.  She has reduced the frequency of an extensive holistic regimen of multiple herbs and essential oils but continues most of the herbs along with anastrozole . She reports no new systemic symptoms beyond the breast changes.  Recent labs from July 22, 2024, show slightly worsened renal function with increased creatinine. She suspects dehydration and eating before the draw. She reports no associated new  symptoms.      ALLERGIES:  is allergic to olmesartan , other, shellfish allergy, erythromycin, moxifloxacin, penicillins, and tequin [gatifloxacin].  MEDICATIONS:  Current Outpatient Medications  Medication Sig Dispense Refill   anastrozole  (ARIMIDEX ) 1 MG tablet Take 1 tablet (1 mg total) by mouth daily. 90 tablet 3   Ascorbic Acid (VITAMIN C) 1000 MG tablet Take 3,000 mg by mouth daily.      Calcium  Carb-Cholecalciferol  600-200 MG-UNIT TABS Take 1 tablet by mouth daily.     Cholecalciferol  (VITAMIN D3) 10000 UNITS capsule Take 10,000 Units by mouth daily.     Coenzyme Q10 (COQ10) 400 MG CAPS Take 400 mg by mouth daily.     DANDELION PO Take 2 capsules by mouth daily.      Digestive Enzymes CAPS Take 2 capsules by mouth 3 (three) times daily with meals.     ferrous gluconate  (FERGON) 325 MG tablet Take 1 tablet (325 mg total) by mouth 3 (three) times daily with meals. (Patient not taking: Reported on 12/31/2023) 30 tablet 0   GARLIC PO Take 3 capsules by mouth daily.     hydrALAZINE  (APRESOLINE ) 100 MG tablet Take 1 tablet (100 mg total) by mouth 3 (three) times daily. 270 tablet 3   hydrochlorothiazide  (HYDRODIURIL ) 25 MG tablet TAKE 1 TABLET (25 MG TOTAL) BY MOUTH DAILY. 90 tablet 0   ipratropium-albuterol  (DUONEB) 0.5-2.5 (3) MG/3ML SOLN Take 3 mLs by nebulization 2 (two) times daily. (Patient not taking: Reported on 12/31/2023) 360 mL 0   labetalol  (NORMODYNE ) 300 MG tablet TAKE 2 TABLETS BY MOUTH TWICE A DAY 360 tablet 1   Lactobacillus (PROBIOTIC ACIDOPHILUS PO) Take 2 tablets by mouth daily.     Melatonin 10  MG TABS Take 20 mg by mouth at bedtime as needed (sleep).  (Patient not taking: Reported on 12/31/2023)     Multiple Vitamin (MULTIVITAMIN WITH MINERALS) TABS tablet Take 1 tablet by mouth at bedtime.     Multiple Vitamins-Iron  (CHLORELLA PO) Take 4 g by mouth daily.     potassium chloride  SA (KLOR-CON  M) 20 MEQ tablet Take 1 tablet (20 mEq total) by mouth daily. 90 tablet 3    Turmeric POWD Take 7.5 mLs by mouth at bedtime.      No current facility-administered medications for this visit.   Facility-Administered Medications Ordered in Other Visits  Medication Dose Route Frequency Provider Last Rate Last Admin   goserelin (ZOLADEX ) injection 3.6 mg  3.6 mg Subcutaneous Q28 days Odean Potts, MD        PHYSICAL EXAMINATION: ECOG PERFORMANCE STATUS: 1 - Symptomatic but completely ambulatory  Vitals:   08/20/24 1349  BP: (!) 190/75  Pulse: 78  Resp: 18  Temp: 97.6 F (36.4 C)  SpO2: 94%   Filed Weights   08/20/24 1349  Weight: 297 lb 3.2 oz (134.8 kg)    Physical Exam VITALS: BP- 190/75  (exam performed in the presence of a chaperone)  LABORATORY DATA:  I have reviewed the data as listed    Latest Ref Rng & Units 07/22/2024    3:05 PM 12/11/2023    2:13 PM 08/21/2022    4:53 PM  CMP  Glucose 70 - 99 mg/dL 842  98  98   BUN 6 - 20 mg/dL 19  16  19    Creatinine 0.44 - 1.00 mg/dL 8.59  8.76  8.86   Sodium 135 - 145 mmol/L 142  140  140   Potassium 3.5 - 5.1 mmol/L 4.1  3.5  3.3   Chloride 98 - 111 mmol/L 106  104  100   CO2 22 - 32 mmol/L 26  27  31    Calcium  8.9 - 10.3 mg/dL 9.0  9.1  9.5   Total Protein 6.5 - 8.1 g/dL 7.4   7.8   Total Bilirubin 0.0 - 1.2 mg/dL 0.4   0.4   Alkaline Phos 38 - 126 U/L 96   82   AST 15 - 41 U/L 28   20   ALT 0 - 44 U/L 21   18     Lab Results  Component Value Date   WBC 8.5 07/22/2024   HGB 11.4 (L) 07/22/2024   HCT 34.6 (L) 07/22/2024   MCV 88.7 07/22/2024   PLT 239 07/22/2024   NEUTROABS 5.6 07/22/2024    ASSESSMENT & PLAN:  Cancer of upper-outer quadrant of female breast (HCC) Right breast invasive lobular cancer grade 1-2, ER 99%, PR 99%, HER-2 negative Ratio 1.3, Ki-67 13%, initial MRI revealed 7.1 cm area of mass or non-mass enhancement. Current treatment: Zoladex  (previously noncompliant), anastrozole  1 mg daily Patient never had surgery because she does not wish to undergo mastectomy.    Breast MRI 07/04/2019: Significant decrease in the previous demonstrated non-mass enhancement UOQ right breast with the exception of 9 mmNME 8:30 position, interval decrease in background enhancement involving all 4 quadrants.  Left breast normal   Current Treatment: Zoladex  with anastrozole  started June 2020 (originally started in 2014) She continues with her herbal supplements and other medication  FSH: 5.2, Estradiol  < 2.5 I discussed with the patient that the levels could still be under the influence of Zoladex  injection that she received on Nov 29, 2023.  We  held Zoladex  and repeated labs   04/10/2024: FSH 7, estradiol  less than 2.5.   07/22/24: FSH 9.5, Estradiol : < 2.5   FSH is indicating that she is not yet in menopause.  But this could be the effect of prior GnRH agonist therapy. As long as the estradiol  is suppressed, we are happy with continuing without additional GnRH therapy   Surveillance: 1.  Mammogram 02/12/2023: Subtle distortion at the site of previous biopsy.  Difficult to determine by mammogram and ultrasound.  Recommendation is breast MRI.  2. Breast MRI 08/14/2024: Progression. NME 4.7 cm (throuout Right breast), nipple retraction new, several Rt Axillary LN with cortical thickness   Patient is not interested in pursuing mastectomy and.  Therefore we would like to resume Zoladex  injections monthly. She also informed me that she will start back on her holistic treatment regimen.  I will see her back in 6 months after the next mammogram.     No orders of the defined types were placed in this encounter.  The patient has a good understanding of the overall plan. she agrees with it. she will call with any problems that may develop before the next visit here.  I personally spent a total of 30 minutes in the care of the patient today including preparing to see the patient, getting/reviewing separately obtained history, performing a medically appropriate exam/evaluation,  counseling and educating, placing orders, referring and communicating with other health care professionals, documenting clinical information in the EHR, independently interpreting results, communicating results, and coordinating care.   Viinay K Yarielys Beed, MD 08/20/24    "

## 2024-08-27 ENCOUNTER — Encounter: Payer: Self-pay | Admitting: Hematology and Oncology

## 2024-08-27 ENCOUNTER — Emergency Department (HOSPITAL_COMMUNITY)
Admission: EM | Admit: 2024-08-27 | Discharge: 2024-08-28 | Disposition: A | Discharge status: Home / Self Care | Attending: Emergency Medicine | Admitting: Emergency Medicine

## 2024-08-27 DIAGNOSIS — R112 Nausea with vomiting, unspecified: Secondary | ICD-10-CM

## 2024-08-28 ENCOUNTER — Encounter (HOSPITAL_COMMUNITY): Payer: Self-pay

## 2024-08-28 ENCOUNTER — Other Ambulatory Visit: Payer: Self-pay

## 2024-08-28 ENCOUNTER — Emergency Department (HOSPITAL_COMMUNITY)

## 2024-08-28 LAB — URINALYSIS, ROUTINE W REFLEX MICROSCOPIC
Bacteria, UA: NONE SEEN
Bilirubin Urine: NEGATIVE
Glucose, UA: NEGATIVE mg/dL
Hgb urine dipstick: NEGATIVE
Ketones, ur: NEGATIVE mg/dL
Leukocytes,Ua: NEGATIVE
Nitrite: NEGATIVE
Protein, ur: 100 mg/dL — AB
Specific Gravity, Urine: 1.017 (ref 1.005–1.030)
pH: 7 (ref 5.0–8.0)

## 2024-08-28 LAB — CBC WITH DIFFERENTIAL/PLATELET
Abs Immature Granulocytes: 0.04 10*3/uL (ref 0.00–0.07)
Basophils Absolute: 0 10*3/uL (ref 0.0–0.1)
Basophils Relative: 0 %
Eosinophils Absolute: 0.1 10*3/uL (ref 0.0–0.5)
Eosinophils Relative: 1 %
HCT: 44.2 % (ref 36.0–46.0)
Hemoglobin: 14.4 g/dL (ref 12.0–15.0)
Immature Granulocytes: 0 %
Lymphocytes Relative: 14 %
Lymphs Abs: 1.5 10*3/uL (ref 0.7–4.0)
MCH: 29.9 pg (ref 26.0–34.0)
MCHC: 32.6 g/dL (ref 30.0–36.0)
MCV: 91.7 fL (ref 80.0–100.0)
Monocytes Absolute: 0.4 10*3/uL (ref 0.1–1.0)
Monocytes Relative: 4 %
Neutro Abs: 8.5 10*3/uL — ABNORMAL HIGH (ref 1.7–7.7)
Neutrophils Relative %: 81 %
Platelets: 277 10*3/uL (ref 150–400)
RBC: 4.82 MIL/uL (ref 3.87–5.11)
RDW: 13.8 % (ref 11.5–15.5)
WBC: 10.5 10*3/uL (ref 4.0–10.5)
nRBC: 0 % (ref 0.0–0.2)

## 2024-08-28 LAB — COMPREHENSIVE METABOLIC PANEL WITH GFR
ALT: 23 U/L (ref 0–44)
AST: 25 U/L (ref 15–41)
Albumin: 4.5 g/dL (ref 3.5–5.0)
Alkaline Phosphatase: 113 U/L (ref 38–126)
Anion gap: 14 (ref 5–15)
BUN: 14 mg/dL (ref 6–20)
CO2: 27 mmol/L (ref 22–32)
Calcium: 9.6 mg/dL (ref 8.9–10.3)
Chloride: 101 mmol/L (ref 98–111)
Creatinine, Ser: 0.95 mg/dL (ref 0.44–1.00)
GFR, Estimated: >60 mL/min
Glucose, Bld: 153 mg/dL — ABNORMAL HIGH (ref 70–99)
Potassium: 3.8 mmol/L (ref 3.5–5.1)
Sodium: 141 mmol/L (ref 135–145)
Total Bilirubin: 0.7 mg/dL (ref 0.0–1.2)
Total Protein: 8.5 g/dL — ABNORMAL HIGH (ref 6.5–8.1)

## 2024-08-28 LAB — LIPASE, BLOOD: Lipase: 26 U/L (ref 11–51)

## 2024-08-28 MED ORDER — ONDANSETRON HCL 4 MG PO TABS: 4.0000 mg | ORAL_TABLET | Freq: Four times a day (QID) | ORAL | 0 refills | Status: AC

## 2024-08-28 MED ORDER — ONDANSETRON HCL 4 MG/2ML IJ SOLN
4.0000 mg | Freq: Once | INTRAMUSCULAR | Status: AC
  Administered 2024-08-28: 4 mg via INTRAVENOUS
  Filled 2024-08-28: qty 2

## 2024-08-28 MED ORDER — IOHEXOL 350 MG/ML SOLN
75.0000 mL | Freq: Once | INTRAVENOUS | Status: AC | PRN
  Administered 2024-08-28: 75 mL via INTRAVENOUS

## 2024-08-28 MED ORDER — LABETALOL HCL 300 MG PO TABS: 600.0000 mg | ORAL_TABLET | Freq: Two times a day (BID) | ORAL | 0 refills | Status: AC

## 2024-08-28 MED ORDER — HYDRALAZINE HCL 25 MG PO TABS
100.0000 mg | ORAL_TABLET | Freq: Once | ORAL | Status: AC
  Administered 2024-08-28: 100 mg via ORAL
  Filled 2024-08-28: qty 4

## 2024-08-28 MED ORDER — LABETALOL HCL 200 MG PO TABS
300.0000 mg | ORAL_TABLET | Freq: Once | ORAL | Status: AC
  Administered 2024-08-28: 300 mg via ORAL
  Filled 2024-08-28: qty 2

## 2024-08-28 MED ORDER — SODIUM CHLORIDE 0.9 % IV BOLUS
1000.0000 mL | Freq: Once | INTRAVENOUS | Status: AC
  Administered 2024-08-28: 1000 mL via INTRAVENOUS

## 2024-08-28 MED ORDER — FENTANYL CITRATE (PF) 50 MCG/ML IJ SOSY
50.0000 ug | PREFILLED_SYRINGE | Freq: Once | INTRAMUSCULAR | Status: AC
  Administered 2024-08-28: 50 ug via INTRAVENOUS
  Filled 2024-08-28: qty 1

## 2024-08-28 NOTE — ED Triage Notes (Addendum)
 Pt reports for the past 5 hours lower abdominal pain and n/v. She reports having yellow-tinged emesis. She also states my kidneys hurt.

## 2024-08-28 NOTE — Discharge Instructions (Signed)
 Thank you for letting us  take care of you today.  You came in today for evaluation of abdominal pain as well as vomiting.  We did do blood work that was overall reassuring.  We also did a CT scan did not show any acute abnormalities.  You did have some mild inflammation around your colon which likely can reflect enteritis.  This may be related to a stomach bug or potentially a foodborne illness.  Will send you home with nausea medication to use as needed.  Please come back to the emergency department if you have persistent or worsening symptoms.

## 2024-08-28 NOTE — ED Provider Notes (Signed)
 " [facility] Provider Note   CSN: [id number] Arrival date & time: 08/27/24  2343     Patient presents with: Abdominal Pain and Emesis   Jasmine Stafford is a 59 y.o. female.   59 y.o female with a PMH of Lobular breast cancer, HTN presents to the ED with a chief complaint of nausea, vomiting and diarrhea since last night. Patient was at home when she began to have multiple episodes of nonbloody non bilious emesis. She states she's unsure how many she had. She does feel dehydrated and is having pain along her back like when I had an AKI. She has missed about three doses of her blood pressure medicine which is why her blood pressure has been so elevated. She does endorse generalized abdominal pain without any focal point of tenderness. No fever, no blood in her stool, no other complaints reported.   The history is provided by the patient.  Abdominal Pain Associated symptoms: nausea and vomiting   Associated symptoms: no chest pain, no chills, no fever and no shortness of breath   Emesis Associated symptoms: abdominal pain   Associated symptoms: no chills and no fever        Prior to Admission medications  Medication Sig Start Date End Date Taking? Authorizing Provider  ondansetron  (ZOFRAN ) 4 MG tablet Take 1 tablet (4 mg total) by mouth every 6 (six) hours. 08/28/24  Yes Laurita Sieving, MD  anastrozole  (ARIMIDEX ) 1 MG tablet Take 1 tablet (1 mg total) by mouth daily. 02/06/24   Gudena, Vinay, MD  Ascorbic Acid (VITAMIN C) 1000 MG tablet Take 3,000 mg by mouth daily.     [provider]  Calcium  Carb-Cholecalciferol  600-200 MG-UNIT TABS Take 1 tablet by mouth daily.    [provider]  Cholecalciferol  (VITAMIN D3) 10000 UNITS capsule Take 10,000 Units by mouth daily.    [provider]  Coenzyme Q10 (COQ10) 400 MG CAPS Take 400 mg by mouth daily.    [provider]  DANDELION PO Take 2 capsules by mouth  daily.     [provider]  Digestive Enzymes CAPS Take 2 capsules by mouth 3 (three) times daily with meals.    [provider]  ferrous gluconate  (FERGON) 325 MG tablet Take 1 tablet (325 mg total) by mouth 3 (three) times daily with meals. Patient not taking: Reported on 12/31/2023 08/18/17   Regalado, Belkys A, MD  GARLIC PO Take 3 capsules by mouth daily.    [provider]  hydrALAZINE  (APRESOLINE ) 100 MG tablet Take 1 tablet (100 mg total) by mouth 3 (three) times daily. 08/21/22   Plotnikov, Aleksei V, MD  hydrochlorothiazide  (HYDRODIURIL ) 25 MG tablet TAKE 1 TABLET (25 MG TOTAL) BY MOUTH DAILY. 10/02/23   Plotnikov, Aleksei V, MD  ipratropium-albuterol  (DUONEB) 0.5-2.5 (3) MG/3ML SOLN Take 3 mLs by nebulization 2 (two) times daily. Patient not taking: Reported on 12/31/2023 08/18/17   Regalado, Owen A, MD  labetalol  (NORMODYNE ) 300 MG tablet Take 2 tablets (600 mg total) by mouth 2 (two) times daily. 08/28/24   Laurita Sieving, MD  Lactobacillus (PROBIOTIC ACIDOPHILUS PO) Take 2 tablets by mouth daily.    [provider]  Melatonin 10 MG TABS Take 20 mg by mouth at bedtime as needed (sleep).  Patient not taking: Reported on 12/31/2023    [provider]  Multiple Vitamin (MULTIVITAMIN WITH MINERALS) TABS tablet Take 1 tablet by mouth at bedtime.    [provider]  Multiple Vitamins-Iron  (CHLORELLA PO) Take 4 g by mouth daily.    [provider]  potassium chloride  SA (KLOR-CON  M) 20 MEQ tablet Take 1 tablet (20 mEq total) by mouth daily. 08/22/22   Plotnikov, Aleksei V, MD  Turmeric POWD Take 7.5 mLs by mouth at bedtime.     [provider]    Allergies: Olmesartan , Other, Shellfish allergy, Erythromycin, Moxifloxacin, Penicillins, and Tequin [gatifloxacin]    Review of Systems  Constitutional:  Negative for chills and fever.  Respiratory:  Negative for shortness of breath.   Cardiovascular:  Negative for chest pain.   Gastrointestinal:  Positive for abdominal pain, nausea and vomiting.  Musculoskeletal:  Positive for back pain.  All other systems reviewed and are negative.   Updated Vital Signs BP 130/60 (BP Location: Right Arm)   Pulse 72   Temp 98.4 F (36.9 C) (Oral)   Resp 18   Ht 5' 4 (1.626 m)   Wt 131.5 kg   SpO2 95%   BMI 49.78 kg/m   Physical Exam Vitals and nursing note reviewed.  Constitutional:      General: She is not in acute distress.    Appearance: She is well-developed.  HENT:     Head: Normocephalic and atraumatic.     Mouth/Throat:     Pharynx: No oropharyngeal exudate.  Eyes:     Pupils: Pupils are equal, round, and reactive to light.  Cardiovascular:     Rate and Rhythm: Regular rhythm.     Heart sounds: Normal heart sounds.  Pulmonary:     Effort: Pulmonary effort is normal. No respiratory distress.     Breath sounds: Normal breath sounds.  Abdominal:     General: Bowel sounds are normal. There is no distension.     Palpations: Abdomen is soft.     Tenderness: There is generalized abdominal tenderness. There is no right CVA tenderness or left CVA tenderness.  Musculoskeletal:        General: No tenderness or deformity.     Cervical back: Normal range of motion.     Right lower leg: No edema.     Left lower leg: No edema.  Skin:    General: Skin is warm and dry.  Neurological:     Mental Status: She is alert and oriented to person, place, and time.     (all labs ordered are listed, but only abnormal results are displayed) Labs Reviewed  CBC WITH DIFFERENTIAL/PLATELET - Abnormal; Notable for the following components:      Result Value   Neutro Abs 8.5 (*)    All other components within normal limits  COMPREHENSIVE METABOLIC PANEL WITH GFR - Abnormal; Notable for the following components:   Glucose, Bld 153 (*)    Total Protein 8.5 (*)    All other components within normal limits  URINALYSIS, ROUTINE W REFLEX MICROSCOPIC - Abnormal; Notable for the  following components:   APPearance HAZY (*)    Protein, ur 100 (*)    All other components within normal limits  LIPASE, BLOOD    EKG: None  Radiology: No results found.   Procedures   Medications Ordered in the ED  sodium chloride  0.9 % bolus 1,000 mL (0 mLs Intravenous Stopped 08/28/24 1655)  ondansetron  (ZOFRAN ) injection 4 mg (4 mg Intravenous Given 08/28/24 1525)  fentaNYL  (SUBLIMAZE ) injection 50 mcg (50 mcg Intravenous Given 08/28/24 1526)  iohexol  (OMNIPAQUE ) 350 MG/ML injection 75 mL (75 mLs Intravenous Contrast Given 08/28/24 1513)  hydrALAZINE  (APRESOLINE ) tablet 100 mg (100 mg Oral Given 08/28/24 1634)  labetalol  (NORMODYNE ) tablet 300 mg (300 mg Oral Given 08/28/24 1633)    Clinical Course as of 09/03/24 0959  Thu Aug 28, 2024  1529 S - Breast cancer, abdominal pain last night. F/u CT. Vomiting BP meds at home.  [DZ]    Clinical Course User Index [DZ] Laurita Sieving, MD                                 Medical Decision Making Amount and/or Complexity of Data Reviewed Labs: ordered. Radiology: ordered.  Risk Prescription drug management.    This patient presents to the ED for concern of abdominal pain, this involves a number of treatment options, and is a complaint that carries with it a high risk of complications and morbidity.  The differential diagnosis includes obstruction, malignancy, viral illness.   Co morbidities: Discussed in HPI   Brief History:  See HPI.  EMR reviewed including pt PMHx, past surgical history and past visits to ER.   See HPI for more details   Lab Tests:  I ordered and independently interpreted labs.  The pertinent results include:    I personally reviewed all laboratory work and imaging. Metabolic panel without any acute abnormality specifically kidney function within normal limits and no significant electrolyte abnormalities. CBC without leukocytosis or significant anemia.  Imaging Studies:  CT Abdomen and pelvis  showed:    Medicines ordered:  I ordered medication including zofran , bolus, fentanyl   for symptomatic treatment  Reevaluation of the patient after these medicines showed that the patient improved I have reviewed the patients home medicines and have made adjustments as needed  Reevaluation:  After the interventions noted above I re-evaluated patient and found that they have :improved   Social Determinants of Health:  The patient's social determinants of health were a factor in the care of this patient  Problem List / ED Course:  Patient with underlying history of lobular carcinoma presents to the ED with a chief complaint of nausea vomiting and diarrhea which began last night around 11 PM.  When she was sitting on the toilet along with holding the trash can in between her legs, had multiple episodes of nonbilious, nonbloody emesis.  She reports she feels like she is dehydrated having some pain along her back like when she had a prior AKI in the past.  She is concerned that she has not had any oral intake since last night.  She is complaining of some generalized abdominal pain without any focal point of tenderness.  She did not see any blood in her stool, no medication changes, no sick contacts. CMP with no electrolyte derangement, creatinine is within normal limits.  LFTs are unremarkable.  Lipase levels normal.  CBC with no leukocytosis, hemoglobin is within normal limits. She was given medication for symptomatic treatment such as bolus, Zofran , fentanyl  to help with pain control.  In the setting of patient with prior history of lobular cancer in some concern for recurrence of cancer after a recent MRI that patient had, will obtain CT abdomen and pelvis in order to rule out any metastatic disease.   Dispostion:  Patient care signed out to incoming provider pending CTAP.    Portions of this note were generated with Scientist, clinical (histocompatibility and immunogenetics). Dictation errors may occur despite best  attempts at proofreading.   Final diagnoses:  Nausea vomiting and diarrhea  ED Discharge Orders          Ordered    ondansetron  (ZOFRAN ) 4 MG tablet  Every 6 hours        08/28/24 1813    labetalol  (NORMODYNE ) 300 MG tablet  2 times daily        08/28/24 1819               Swan Zayed, PA-C 09/03/24 9040  "

## 2024-08-28 NOTE — ED Provider Notes (Signed)
" °  Physical Exam  BP 137/69 (BP Location: Right Arm)   Pulse 70   Temp 98.4 F (36.9 C) (Oral)   Resp 18   Ht 5' 4 (1.626 m)   Wt 131.5 kg   SpO2 94%   BMI 49.78 kg/m   Physical Exam  Procedures  Procedures  ED Course / MDM   Clinical Course as of 08/28/24 1734  Thu Aug 28, 2024  1529 S - Breast cancer, abdominal pain last night. F/u CT. Vomiting BP meds at home.  [DZ]    Clinical Course User Index [DZ] Laurita Sieving, MD   Medical Decision Making Amount and/or Complexity of Data Reviewed Labs: ordered. Radiology: ordered.  Risk Prescription drug management.   Patient is a 59 year old female who presents today for evaluation of abdominal pain as well as emesis.  Review of patient's laboratory evaluation without any acute findings.  No leukocytosis and otherwise unremarkable metabolic panel.  Lipase within normal limits.  No evidence of urinary tract infection on UA.  CT abdomen pelvis with evidence of enteritis.  On clinical bedside reevaluation patient had improvement of her blood pressure now within normal range after receiving her home oral antihypertensives.  Has significant improvement of her abdominal pain with no recurrence of vomiting despite p.o. challenge.  Felt that patient symptoms likely related to enteritis at this time.  No evidence of pancreatitis, cholecystitis, sepsis, acute malignancy, SBO.  Will send home with Zofran  to use as needed for symptomatic management.  Return precautions discussed.  Patient was discharged stable condition.      Laurita Sieving, MD 08/28/24 8185    Bernard Drivers, MD 08/30/24 1347  "

## 2024-08-28 NOTE — ED Notes (Signed)
Unsuccessful IV attempt to right AC.

## 2024-08-28 NOTE — ED Notes (Signed)
Pt was given discharge instructions and verbalized understanding.

## 2024-09-17 ENCOUNTER — Inpatient Hospital Stay

## 2024-10-15 ENCOUNTER — Inpatient Hospital Stay

## 2024-11-05 ENCOUNTER — Inpatient Hospital Stay

## 2024-11-12 ENCOUNTER — Inpatient Hospital Stay

## 2024-11-17 ENCOUNTER — Inpatient Hospital Stay: Admitting: Hematology and Oncology

## 2024-12-10 ENCOUNTER — Inpatient Hospital Stay

## 2025-01-07 ENCOUNTER — Inpatient Hospital Stay

## 2025-02-04 ENCOUNTER — Inpatient Hospital Stay
